# Patient Record
Sex: Female | Born: 1935
Health system: Southern US, Community
[De-identification: ages and names within clinical notes are randomized; demographics above are authoritative.]

## PROBLEM LIST (undated history)

## (undated) DIAGNOSIS — E079 Disorder of thyroid, unspecified: Secondary | ICD-10-CM

## (undated) DIAGNOSIS — K589 Irritable bowel syndrome without diarrhea: Secondary | ICD-10-CM

## (undated) DIAGNOSIS — I10 Essential (primary) hypertension: Secondary | ICD-10-CM

## (undated) DIAGNOSIS — I70209 Unspecified atherosclerosis of native arteries of extremities, unspecified extremity: Secondary | ICD-10-CM

## (undated) DIAGNOSIS — K5790 Diverticulosis of intestine, part unspecified, without perforation or abscess without bleeding: Secondary | ICD-10-CM

## (undated) DIAGNOSIS — M810 Age-related osteoporosis without current pathological fracture: Secondary | ICD-10-CM

## (undated) DIAGNOSIS — M1990 Unspecified osteoarthritis, unspecified site: Secondary | ICD-10-CM

## (undated) DIAGNOSIS — E7800 Pure hypercholesterolemia, unspecified: Secondary | ICD-10-CM

## (undated) HISTORY — PX: CHOLECYSTECTOMY: SHX55

## (undated) HISTORY — DX: Pure hypercholesterolemia, unspecified: E78.00

## (undated) HISTORY — DX: Irritable bowel syndrome, unspecified: K58.9

## (undated) HISTORY — PX: APPENDECTOMY: SHX54

## (undated) HISTORY — DX: Unspecified atherosclerosis of native arteries of extremities, unspecified extremity: I70.209

## (undated) HISTORY — PX: COLON SURGERY: SHX602

## (undated) HISTORY — DX: Unspecified osteoarthritis, unspecified site: M19.90

## (undated) HISTORY — DX: Age-related osteoporosis without current pathological fracture: M81.0

## (undated) HISTORY — DX: Diverticulosis of intestine, part unspecified, without perforation or abscess without bleeding: K57.90

---

## 1998-05-31 ENCOUNTER — Emergency Department (HOSPITAL_COMMUNITY): Admission: EM | Admit: 1998-05-31 | Discharge: 1998-05-31 | Payer: Self-pay | Admitting: Emergency Medicine

## 1998-12-17 ENCOUNTER — Ambulatory Visit (HOSPITAL_COMMUNITY): Admission: RE | Admit: 1998-12-17 | Discharge: 1998-12-17 | Payer: Self-pay | Admitting: *Deleted

## 1999-11-02 ENCOUNTER — Other Ambulatory Visit: Admission: RE | Admit: 1999-11-02 | Discharge: 1999-11-02 | Payer: Self-pay | Admitting: Internal Medicine

## 1999-11-05 ENCOUNTER — Encounter: Admission: RE | Admit: 1999-11-05 | Discharge: 1999-11-05 | Payer: Self-pay | Admitting: Internal Medicine

## 1999-11-05 ENCOUNTER — Encounter: Payer: Self-pay | Admitting: Internal Medicine

## 2000-11-21 ENCOUNTER — Encounter: Payer: Self-pay | Admitting: Internal Medicine

## 2000-11-21 ENCOUNTER — Encounter: Admission: RE | Admit: 2000-11-21 | Discharge: 2000-11-21 | Payer: Self-pay | Admitting: Internal Medicine

## 2001-03-27 ENCOUNTER — Encounter (INDEPENDENT_AMBULATORY_CARE_PROVIDER_SITE_OTHER): Payer: Self-pay | Admitting: Specialist

## 2001-03-27 ENCOUNTER — Ambulatory Visit (HOSPITAL_COMMUNITY): Admission: RE | Admit: 2001-03-27 | Discharge: 2001-03-27 | Payer: Self-pay | Admitting: *Deleted

## 2001-05-24 ENCOUNTER — Encounter: Admission: RE | Admit: 2001-05-24 | Discharge: 2001-05-24 | Payer: Self-pay | Admitting: Internal Medicine

## 2001-05-24 ENCOUNTER — Encounter: Payer: Self-pay | Admitting: Internal Medicine

## 2001-11-22 ENCOUNTER — Encounter: Admission: RE | Admit: 2001-11-22 | Discharge: 2001-11-22 | Payer: Self-pay | Admitting: Internal Medicine

## 2001-11-22 ENCOUNTER — Encounter: Payer: Self-pay | Admitting: Internal Medicine

## 2002-01-29 ENCOUNTER — Other Ambulatory Visit: Admission: RE | Admit: 2002-01-29 | Discharge: 2002-01-29 | Payer: Self-pay | Admitting: Internal Medicine

## 2002-06-13 ENCOUNTER — Encounter (INDEPENDENT_AMBULATORY_CARE_PROVIDER_SITE_OTHER): Payer: Self-pay | Admitting: Specialist

## 2002-06-13 ENCOUNTER — Ambulatory Visit (HOSPITAL_COMMUNITY): Admission: RE | Admit: 2002-06-13 | Discharge: 2002-06-13 | Payer: Self-pay | Admitting: *Deleted

## 2002-12-11 ENCOUNTER — Encounter: Admission: RE | Admit: 2002-12-11 | Discharge: 2002-12-11 | Payer: Self-pay | Admitting: Internal Medicine

## 2002-12-11 ENCOUNTER — Encounter: Payer: Self-pay | Admitting: Internal Medicine

## 2002-12-19 ENCOUNTER — Encounter: Payer: Self-pay | Admitting: Internal Medicine

## 2002-12-19 ENCOUNTER — Encounter: Admission: RE | Admit: 2002-12-19 | Discharge: 2002-12-19 | Payer: Self-pay | Admitting: Internal Medicine

## 2003-12-22 ENCOUNTER — Encounter: Admission: RE | Admit: 2003-12-22 | Discharge: 2003-12-22 | Payer: Self-pay | Admitting: Internal Medicine

## 2004-05-18 ENCOUNTER — Encounter (INDEPENDENT_AMBULATORY_CARE_PROVIDER_SITE_OTHER): Payer: Self-pay | Admitting: Specialist

## 2004-05-18 ENCOUNTER — Ambulatory Visit (HOSPITAL_COMMUNITY): Admission: RE | Admit: 2004-05-18 | Discharge: 2004-05-18 | Payer: Self-pay | Admitting: *Deleted

## 2004-05-21 ENCOUNTER — Emergency Department (HOSPITAL_COMMUNITY): Admission: EM | Admit: 2004-05-21 | Discharge: 2004-05-21 | Payer: Self-pay | Admitting: Emergency Medicine

## 2005-03-16 ENCOUNTER — Encounter: Admission: RE | Admit: 2005-03-16 | Discharge: 2005-03-16 | Payer: Self-pay | Admitting: Internal Medicine

## 2006-04-04 ENCOUNTER — Encounter: Admission: RE | Admit: 2006-04-04 | Discharge: 2006-04-04 | Payer: Self-pay | Admitting: Internal Medicine

## 2007-04-06 ENCOUNTER — Encounter: Admission: RE | Admit: 2007-04-06 | Discharge: 2007-04-06 | Payer: Self-pay | Admitting: Internal Medicine

## 2007-05-14 ENCOUNTER — Other Ambulatory Visit: Admission: RE | Admit: 2007-05-14 | Discharge: 2007-05-14 | Payer: Self-pay | Admitting: Internal Medicine

## 2007-07-03 ENCOUNTER — Ambulatory Visit (HOSPITAL_COMMUNITY): Admission: RE | Admit: 2007-07-03 | Discharge: 2007-07-03 | Payer: Self-pay | Admitting: *Deleted

## 2007-07-03 ENCOUNTER — Encounter (INDEPENDENT_AMBULATORY_CARE_PROVIDER_SITE_OTHER): Payer: Self-pay | Admitting: *Deleted

## 2008-04-07 ENCOUNTER — Encounter: Admission: RE | Admit: 2008-04-07 | Discharge: 2008-04-07 | Payer: Self-pay | Admitting: Internal Medicine

## 2009-04-08 ENCOUNTER — Encounter: Admission: RE | Admit: 2009-04-08 | Discharge: 2009-04-08 | Payer: Self-pay | Admitting: Internal Medicine

## 2009-05-01 ENCOUNTER — Encounter: Admission: RE | Admit: 2009-05-01 | Discharge: 2009-05-01 | Payer: Self-pay | Admitting: *Deleted

## 2009-05-03 ENCOUNTER — Emergency Department (HOSPITAL_COMMUNITY): Admission: EM | Admit: 2009-05-03 | Discharge: 2009-05-03 | Payer: Self-pay | Admitting: Emergency Medicine

## 2010-04-12 ENCOUNTER — Encounter: Admission: RE | Admit: 2010-04-12 | Discharge: 2010-04-12 | Payer: Self-pay | Admitting: Internal Medicine

## 2011-01-23 LAB — BASIC METABOLIC PANEL
GFR calc non Af Amer: 59 mL/min — ABNORMAL LOW (ref >60)
Sodium: 136 mEq/L (ref 135–145)

## 2011-01-23 LAB — URINE CULTURE
Colony Count: NO GROWTH
Culture: NO GROWTH

## 2011-01-23 LAB — CBC
Hemoglobin: 15.8 g/dL — ABNORMAL HIGH (ref 12.0–15.0)
MCHC: 33.8 g/dL (ref 30.0–36.0)
Platelets: 361 10*3/uL (ref 150–400)
RBC: 5.14 MIL/uL — ABNORMAL HIGH (ref 3.87–5.11)
RDW: 13.1 % (ref 11.5–15.5)
WBC: 9.2 10*3/uL (ref 4.0–10.5)

## 2011-01-23 LAB — URINALYSIS, ROUTINE W REFLEX MICROSCOPIC
Ketones, ur: NEGATIVE mg/dL
Nitrite: NEGATIVE
Specific Gravity, Urine: 1.01 (ref 1.005–1.030)
Urobilinogen, UA: 0.2 mg/dL (ref 0.0–1.0)
pH: 7.5 (ref 5.0–8.0)

## 2011-01-23 LAB — GLUCOSE, CAPILLARY

## 2011-01-23 LAB — DIFFERENTIAL
Basophils Relative: 0 % (ref 0–1)
Eosinophils Absolute: 0 10*3/uL (ref 0.0–0.7)
Eosinophils Relative: 0 % (ref 0–5)
Lymphocytes Relative: 16 % (ref 12–46)

## 2011-03-01 NOTE — Op Note (Signed)
NAME:  Danielle Proctor, Danielle Proctor NO.:  [id number]   MEDICAL RECORD NO.:  [id number]          PATIENT TYPE:  AMB   LOCATION:  ENDO                         FACILITY:  [facility]   PHYSICIAN:  Georgiana Spinner, M.D.    DATE OF BIRTH:  1929-06-26   DATE OF PROCEDURE:  07/02/2007  DATE OF DISCHARGE:                               OPERATIVE REPORT   PROCEDURE:  Upper endoscopy.   INDICATIONS:  GERD.   ANESTHESIA:  Fentanyl 50 mcg, Versed 5 mg.   PROCEDURE:  With the patient mildly sedated in the left lateral  decubitus position, the Pentax videoscopic endoscope was inserted in the  mouth, passed under direct vision through the esophagus, which appeared  normal.  We could never get a clear view of the squamocolumnar junction  but on one view with the esophagus contracted, we did see areas that  could be conceivably Barrett's esophagus, biopsied.  Then entered into  the stomach.  Fundus, body appeared erythematous and there was a clear  demarcation from the antrum, which appeared normal in coloration.  This  was photographed and the body was biopsied to rule out H. pylori.  The  endoscope was advanced through the pylorus and duodenal bulb, second  portion duodenum were visualized.  From this point the endoscope was  slowly withdrawn taking circumferential views of the duodenal mucosa  until the endoscope had been pulled back into the stomach and placed in  retroflexion to view the stomach from below.  The endoscope was  straightened and withdrawn taking circumferential views of the remaining  gastric and esophageal mucosa.  The patient's vital signs, pulse  oximeter remained stable.  The patient tolerated the procedure well  without apparent complications.   FINDINGS:  1. Question of gastritis of body and fundus, biopsied.  2. Question of Barrett's esophagus, biopsied.   Await biopsy reports.  The patient will call me for results and follow  up with me as needed as an  outpatient.           ______________________________  Georgiana Spinner, M.D.     GMO/MEDQ  D:  07/03/2007  T:  07/03/2007  Job:  [id number]

## 2011-03-04 NOTE — Procedures (Signed)
[place]  Patient:    Danielle Proctor, Danielle Proctor                   MRN: [id number] Adm. Date:  [id number] Attending:  Sabino Gasser                           Procedure Report  PROCEDURE:  Upper endoscopy with biopsy.  INDICATIONS:  GERD, Barretts esophagus.  ANESTHESIA:  Demerol 50 mg, Versed 5 mg.  DESCRIPTION OF PROCEDURE:  With patient mildly sedated in the left lateral decubitus position, the Olympus videoscopic endoscope inserted in the mouth, passed under direct vision through the esophagus into the stomach.  Fundus, body, antrum, duodenal bulb, second portion of duodenum were all well-visualized and photographs taken.  From this point, the endoscope was slowly withdrawn, taking circumferential views of the entire duodenal mucosa until the endoscope pulled back in the stomach, placed in retroflexion to view the stomach from below, and this was photographed.  The endoscope was straightened and withdrawn, taking circumferential views of the entire gastric mucosa, stopping then in the fundus, where a snake-skinning appearance to the mucosa was seen, photographed, and biopsied.  The endoscope was then pulled back into the distal esophagus, which showed changes of Barretts esophagus, photographed and biopsies taken.  The endoscope was withdrawn.  The patient stable vital signs and pulse oximetry remained stable.  The patient tolerated the procedure well without apparent complications.  FINDINGS:  Changes of Barretts esophagus, distal esophagus.  Changes of snake-skinning, rule out gastritis, of the fundus of the stomach.  Otherwise this as an unremarkable endoscopic examination.  PLAN:  The patient will call me for results of biopsy and follow up with me as an outpatient. DD:  03/27/01 TD:  03/27/01 Job: [id number]

## 2011-03-04 NOTE — Op Note (Signed)
NAME:  DION, PARROW NO.:  [id number]   MEDICAL RECORD NO.:  [id number]                   PATIENT TYPE:  AMB   LOCATION:  ENDO                                 FACILITY:  [facility]   PHYSICIAN:  Georgiana Spinner, M.D.                 DATE OF BIRTH:  [birth year]   DATE OF PROCEDURE:  DATE OF DISCHARGE:                                 OPERATIVE REPORT   PROCEDURE:  Colonoscopy.   INDICATIONS FOR PROCEDURE:  Colon cancer screening.   ANESTHESIA:  Demerol 25, Versed 2 mg.   DESCRIPTION OF PROCEDURE:  With the patient mildly sedated in the left  lateral decubitus position, the Olympus videoscopic colonoscope was inserted  in the rectum and passed under direct vision to the cecum, identified by  ileocecal valve and appendiceal orifice and base of the cecum.  The latter  of which were photographed.  From this point, the colonoscope was slowly  withdrawn, taking circumferential views of the colonic mucosa, stopping only  in the rectum which appeared normal.  The rectum showed hemorrhoids on  retroflexed view.  The endoscope was straightened and withdrawn.  The  patient's vital signs and pulse oximeter remained stable.  The patient  tolerated the procedure well without apparent complications.   FINDINGS:  Internal hemorrhoids, otherwise unremarkable colonoscopic  examination to the cecum.   PLAN:  See endoscopy note for further details.                                               Georgiana Spinner, M.D.    GMO/MEDQ  D:  05/18/2004  T:  05/18/2004  Job:  [id number]

## 2011-03-04 NOTE — Op Note (Signed)
   TNAMESMANTHA, BOAKYE                     ACCOUNT NO.:  [id number]   MEDICAL RECORD NO.:  [id number]                   PATIENT TYPE:  AMB   LOCATION:  ENDO                                 FACILITY:  [facility]   PHYSICIAN:  Georgiana Spinner, M.D.                 DATE OF BIRTH:  [birth year]   DATE OF PROCEDURE:  DATE OF DISCHARGE:                                 OPERATIVE REPORT   PROCEDURE:  Endoscopy with biopsy.   INDICATIONS FOR PROCEDURE:  Barrett's esophagus by biopsy in the past,  gastroesophageal reflux disease.   ANESTHESIA:  Demerol 50, Versed 7 mg.   DESCRIPTION OF PROCEDURE:  With the patient mildly sedated in the left  lateral decubitus position, the Olympus videoscopic endoscope was inserted  in the mouth and passed under direct vision through the esophagus which  appeared normal until we reached the distal esophagus and there was a  question of short segment Barrett's seen, photographed and biopsied. We  entered into the stomach, the fundus, body, antrum, duodenal bulb and second  portion of the duodenum all appeared normal. From this point, the endoscope  was slowly withdrawn taking circumferential views of the entire duodenal  mucosa until the endoscope was then pulled back in the stomach, placed in  retroflexion to view the stomach from below. The endoscope was then  straightened and withdrawn taking circumferential views of the remaining  gastric and esophageal mucosa. The patient's vital signs and pulse oximeter  remained stable. The patient tolerated the procedure well without apparent  complications.   FINDINGS:  Question of short segment Barrett's esophagus, otherwise,  unremarkable exam.   PLAN:  Await biopsy report. The patient will call me for results and  followup with me as an outpatient.                                                 Georgiana Spinner, M.D.    GMO/MEDQ  D:  06/13/2002  T:  06/14/2002  Job:  [id number]

## 2011-03-04 NOTE — Op Note (Signed)
NAME:  Danielle, Proctor NO.:  [id number]   MEDICAL RECORD NO.:  [id number]                   PATIENT TYPE:  AMB   LOCATION:  ENDO                                 FACILITY:  [facility]   PHYSICIAN:  Georgiana Spinner, M.D.                 DATE OF BIRTH:  1928-11-16   DATE OF PROCEDURE:  05/18/2004  DATE OF DISCHARGE:                                 OPERATIVE REPORT   PROCEDURE PERFORMED:  Upper endoscopy.   ENDOSCOPIST:  Georgiana Spinner, M.D.   INDICATIONS FOR PROCEDURE:  Barrett's esophagus, gastroesophageal reflux  disease.   ANESTHESIA:  Demerol 50 mg, Versed 6 mg.   DESCRIPTION OF PROCEDURE:  With the patient mildly sedated in the left  lateral decubitus position, the Olympus video endoscope was inserted in the  mouth and passed under direct vision through the esophagus which appeared  normal until we reached the distal esophagus and there were changes of  Barrett's photographed and biopsied.  We entered into the stomach.  The  fundus, body, antrum, duodenal bulb and second portion of the duodenum were  visualized.  From this point, the endoscope was slowly withdrawn taking  circumferential views of the entire duodenal mucosa until the endoscope was  pulled back into the stomach and placed on retroflexion to view the stomach  from below.  The endoscope was then straightened and withdrawn taking  circumferential views of the remaining gastric and esophageal mucosa.  The  patient's vital signs and pulse oximeter remained stable.  The patient  tolerated the procedure well without apparent complications.   FINDINGS:  Barrett's esophagus biopsied.  Await biopsy report.  The patient  will call me for results and follow up with me as an outpatient.  Proceed to  colonoscopy as planned.                                               Georgiana Spinner, M.D.    GMO/MEDQ  D:  05/18/2004  T:  05/18/2004  Job:  [id number]

## 2011-03-24 ENCOUNTER — Other Ambulatory Visit: Payer: Self-pay | Admitting: Internal Medicine

## 2011-03-24 DIAGNOSIS — Z1231 Encounter for screening mammogram for malignant neoplasm of breast: Secondary | ICD-10-CM

## 2011-03-30 ENCOUNTER — Ambulatory Visit: Payer: Self-pay

## 2011-04-07 ENCOUNTER — Ambulatory Visit
Admission: RE | Admit: 2011-04-07 | Discharge: 2011-04-07 | Disposition: A | Discharge status: Home / Self Care | Payer: Medicare Other | Source: Ambulatory Visit | Attending: Internal Medicine | Admitting: Internal Medicine

## 2011-04-07 DIAGNOSIS — Z1231 Encounter for screening mammogram for malignant neoplasm of breast: Secondary | ICD-10-CM

## 2012-03-27 ENCOUNTER — Other Ambulatory Visit: Payer: Self-pay | Admitting: Internal Medicine

## 2012-03-27 DIAGNOSIS — Z1231 Encounter for screening mammogram for malignant neoplasm of breast: Secondary | ICD-10-CM

## 2012-04-09 ENCOUNTER — Ambulatory Visit
Admission: RE | Admit: 2012-04-09 | Discharge: 2012-04-09 | Disposition: A | Discharge status: Home / Self Care | Payer: Medicare Other | Source: Ambulatory Visit | Attending: Internal Medicine | Admitting: Internal Medicine

## 2012-04-09 DIAGNOSIS — Z1231 Encounter for screening mammogram for malignant neoplasm of breast: Secondary | ICD-10-CM

## 2012-07-13 ENCOUNTER — Other Ambulatory Visit: Payer: Self-pay | Admitting: Internal Medicine

## 2012-07-13 DIAGNOSIS — I70209 Unspecified atherosclerosis of native arteries of extremities, unspecified extremity: Secondary | ICD-10-CM

## 2012-07-20 ENCOUNTER — Ambulatory Visit
Admission: RE | Admit: 2012-07-20 | Discharge: 2012-07-20 | Disposition: A | Discharge status: Home / Self Care | Payer: Medicare Other | Source: Ambulatory Visit | Attending: Internal Medicine | Admitting: Internal Medicine

## 2012-07-20 DIAGNOSIS — I70209 Unspecified atherosclerosis of native arteries of extremities, unspecified extremity: Secondary | ICD-10-CM

## 2013-04-02 ENCOUNTER — Other Ambulatory Visit: Payer: Self-pay

## 2013-04-02 DIAGNOSIS — Z1231 Encounter for screening mammogram for malignant neoplasm of breast: Secondary | ICD-10-CM

## 2013-05-07 ENCOUNTER — Ambulatory Visit
Admission: RE | Admit: 2013-05-07 | Discharge: 2013-05-07 | Disposition: A | Discharge status: Home / Self Care | Payer: Medicare Other | Source: Ambulatory Visit

## 2013-05-07 DIAGNOSIS — Z1231 Encounter for screening mammogram for malignant neoplasm of breast: Secondary | ICD-10-CM

## 2014-04-07 ENCOUNTER — Other Ambulatory Visit: Payer: Self-pay

## 2014-04-07 DIAGNOSIS — Z1231 Encounter for screening mammogram for malignant neoplasm of breast: Secondary | ICD-10-CM

## 2014-05-08 ENCOUNTER — Ambulatory Visit
Admission: RE | Admit: 2014-05-08 | Discharge: 2014-05-08 | Disposition: A | Discharge status: Home / Self Care | Payer: Medicare Other | Source: Ambulatory Visit

## 2014-05-08 ENCOUNTER — Encounter (INDEPENDENT_AMBULATORY_CARE_PROVIDER_SITE_OTHER): Payer: Self-pay

## 2014-05-08 DIAGNOSIS — Z1231 Encounter for screening mammogram for malignant neoplasm of breast: Secondary | ICD-10-CM

## 2014-06-22 ENCOUNTER — Emergency Department (HOSPITAL_COMMUNITY)
Admission: EM | Admit: 2014-06-22 | Discharge: 2014-06-22 | Disposition: A | Discharge status: Home / Self Care | Payer: Medicare Other | Attending: Emergency Medicine | Admitting: Emergency Medicine

## 2014-06-22 ENCOUNTER — Encounter (HOSPITAL_COMMUNITY): Payer: Self-pay | Admitting: Emergency Medicine

## 2014-06-22 ENCOUNTER — Emergency Department (HOSPITAL_COMMUNITY): Payer: Medicare Other

## 2014-06-22 DIAGNOSIS — E079 Disorder of thyroid, unspecified: Secondary | ICD-10-CM | POA: Diagnosis not present

## 2014-06-22 DIAGNOSIS — I10 Essential (primary) hypertension: Secondary | ICD-10-CM | POA: Insufficient documentation

## 2014-06-22 DIAGNOSIS — R109 Unspecified abdominal pain: Secondary | ICD-10-CM | POA: Diagnosis present

## 2014-06-22 DIAGNOSIS — R231 Pallor: Secondary | ICD-10-CM | POA: Insufficient documentation

## 2014-06-22 DIAGNOSIS — K589 Irritable bowel syndrome without diarrhea: Secondary | ICD-10-CM | POA: Diagnosis not present

## 2014-06-22 DIAGNOSIS — Z79899 Other long term (current) drug therapy: Secondary | ICD-10-CM | POA: Insufficient documentation

## 2014-06-22 DIAGNOSIS — R1030 Lower abdominal pain, unspecified: Secondary | ICD-10-CM

## 2014-06-22 DIAGNOSIS — Z7982 Long term (current) use of aspirin: Secondary | ICD-10-CM | POA: Diagnosis not present

## 2014-06-22 HISTORY — DX: Essential (primary) hypertension: I10

## 2014-06-22 HISTORY — DX: Disorder of thyroid, unspecified: E07.9

## 2014-06-22 LAB — COMPREHENSIVE METABOLIC PANEL
ALK PHOS: 19 U/L — AB (ref 39–117)
ALT: 24 U/L (ref 0–35)
AST: 30 U/L (ref 0–37)
Albumin: 4.1 g/dL (ref 3.5–5.2)
Anion gap: 17 — ABNORMAL HIGH (ref 5–15)
BILIRUBIN TOTAL: 0.5 mg/dL (ref 0.3–1.2)
BUN: 16 mg/dL (ref 6–23)
CHLORIDE: 99 meq/L (ref 96–112)
CO2: 23 mEq/L (ref 19–32)
Calcium: 9.8 mg/dL (ref 8.4–10.5)
Creatinine, Ser: 1.07 mg/dL (ref 0.50–1.10)
GFR calc Af Amer: 54 mL/min — ABNORMAL LOW (ref >90)
GFR calc non Af Amer: 47 mL/min — ABNORMAL LOW (ref >90)
Glucose, Bld: 128 mg/dL — ABNORMAL HIGH (ref 70–99)
POTASSIUM: 4.3 meq/L (ref 3.7–5.3)
Sodium: 139 mEq/L (ref 137–147)
Total Protein: 7.3 g/dL (ref 6.0–8.3)

## 2014-06-22 LAB — CBC WITH DIFFERENTIAL/PLATELET
BASOS ABS: 0 10*3/uL (ref 0.0–0.1)
BASOS PCT: 0 % (ref 0–1)
Eosinophils Absolute: 0 10*3/uL (ref 0.0–0.7)
Eosinophils Relative: 0 % (ref 0–5)
HCT: 42.1 % (ref 36.0–46.0)
Hemoglobin: 14.6 g/dL (ref 12.0–15.0)
LYMPHS PCT: 25 % (ref 12–46)
Lymphs Abs: 1.5 10*3/uL (ref 0.7–4.0)
MCH: 30.5 pg (ref 26.0–34.0)
MCHC: 34.7 g/dL (ref 30.0–36.0)
MCV: 88.1 fL (ref 78.0–100.0)
Monocytes Absolute: 0.3 10*3/uL (ref 0.1–1.0)
Monocytes Relative: 6 % (ref 3–12)
NEUTROS ABS: 4.2 10*3/uL (ref 1.7–7.7)
Neutrophils Relative %: 69 % (ref 43–77)
Platelets: 276 10*3/uL (ref 150–400)
RBC: 4.78 MIL/uL (ref 3.87–5.11)
RDW: 12.1 % (ref 11.5–15.5)
WBC: 6.1 10*3/uL (ref 4.0–10.5)

## 2014-06-22 LAB — URINALYSIS, ROUTINE W REFLEX MICROSCOPIC
BILIRUBIN URINE: NEGATIVE
GLUCOSE, UA: NEGATIVE mg/dL
HGB URINE DIPSTICK: NEGATIVE
KETONES UR: NEGATIVE mg/dL
Leukocytes, UA: NEGATIVE
Nitrite: NEGATIVE
PH: 7 (ref 5.0–8.0)
Protein, ur: NEGATIVE mg/dL
Specific Gravity, Urine: 1.007 (ref 1.005–1.030)
Urobilinogen, UA: 0.2 mg/dL (ref 0.0–1.0)

## 2014-06-22 MED ORDER — DICYCLOMINE HCL 20 MG PO TABS: 20.0000 mg | ORAL_TABLET | Freq: Three times a day (TID) | ORAL | Status: DC | PRN

## 2014-06-22 MED ORDER — IOHEXOL 300 MG/ML  SOLN
50.0000 mL | Freq: Once | INTRAMUSCULAR | Status: AC | PRN
  Administered 2014-06-22: 50 mL via ORAL

## 2014-06-22 MED ORDER — DICYCLOMINE HCL 10 MG/ML IM SOLN
10.0000 mg | Freq: Once | INTRAMUSCULAR | Status: AC
  Administered 2014-06-22: 10 mg via INTRAMUSCULAR
  Filled 2014-06-22: qty 2

## 2014-06-22 MED ORDER — SODIUM CHLORIDE 0.9 % IV BOLUS (SEPSIS)
700.0000 mL | Freq: Once | INTRAVENOUS | Status: AC
  Administered 2014-06-22: 700 mL via INTRAVENOUS

## 2014-06-22 MED ORDER — ONDANSETRON HCL 4 MG/2ML IJ SOLN
4.0000 mg | Freq: Once | INTRAMUSCULAR | Status: AC
  Administered 2014-06-22: 4 mg via INTRAVENOUS
  Filled 2014-06-22: qty 2

## 2014-06-22 MED ORDER — SODIUM CHLORIDE 0.9 % IV SOLN
INTRAVENOUS | Status: DC
  Administered 2014-06-22: 17:00:00 via INTRAVENOUS

## 2014-06-22 MED ORDER — HYOSCYAMINE SULFATE 0.125 MG SL SUBL
0.1250 mg | SUBLINGUAL_TABLET | Freq: Once | SUBLINGUAL | Status: AC
  Administered 2014-06-22: 0.125 mg via SUBLINGUAL
  Filled 2014-06-22: qty 1

## 2014-06-22 MED ORDER — FENTANYL CITRATE 0.05 MG/ML IJ SOLN
25.0000 ug | Freq: Once | INTRAMUSCULAR | Status: AC
  Administered 2014-06-22: 25 ug via INTRAVENOUS
  Filled 2014-06-22: qty 2

## 2014-06-22 MED ORDER — IOHEXOL 300 MG/ML  SOLN
100.0000 mL | Freq: Once | INTRAMUSCULAR | Status: AC | PRN
  Administered 2014-06-22: 100 mL via INTRAVENOUS

## 2014-06-22 NOTE — ED Notes (Signed)
Patient transported to CT 

## 2014-06-22 NOTE — ED Notes (Signed)
Pt reports intermittent pain in LLQ , and bloating , intermittent nausea x 2 weeks. Assessed by GI , Dr Randa Evens on Tuesday. Pt has been very active and c/o fatigue. Denies dizziness. AO x$. Pt ambulatory, drove self

## 2014-06-22 NOTE — Discharge Instructions (Signed)
Go back to the bland diet for a couple of days. Use the bentyl for abdominal pain. Recheck if you get fever, vomiting, worsening pain.  Irritable Bowel Syndrome Irritable bowel syndrome (IBS) is caused by a disturbance of normal bowel function and is a common digestive disorder. You may also hear this condition called spastic colon, mucous colitis, and irritable colon. There is no cure for IBS. However, symptoms often gradually improve or disappear with a good diet, stress management, and medicine. This condition usually appears in late adolescence or early adulthood. Women develop it twice as often as men. CAUSES  After food has been digested and absorbed in the small intestine, waste material is moved into the large intestine, or colon. In the colon, water and salts are absorbed from the undigested products coming from the small intestine. The remaining residue, or fecal material, is held for elimination. Under normal circumstances, gentle, rhythmic contractions of the bowel walls push the fecal material along the colon toward the rectum. In IBS, however, these contractions are irregular and poorly coordinated. The fecal material is either retained too long, resulting in constipation, or expelled too soon, producing diarrhea. SIGNS AND SYMPTOMS  The most common symptom of IBS is abdominal pain. It is often in the lower left side of the abdomen, but it may occur anywhere in the abdomen. The pain comes from spasms of the bowel muscles happening too much and from the buildup of gas and fecal material in the colon. This pain:  Can range from sharp abdominal cramps to a dull, continuous ache.  Often worsens soon after eating.  Is often relieved by having a bowel movement or passing gas. Abdominal pain is usually accompanied by constipation, but it may also produce diarrhea. The diarrhea often occurs right after a meal or upon waking up in the morning. The stools are often soft, watery, and flecked with  mucus. Other symptoms of IBS include:  Bloating.  Loss of appetite.  Heartburn.  Backache.  Dull pain in the arms or shoulders.  Nausea.  Burping.  Vomiting.  Gas. IBS may also cause symptoms that are unrelated to the digestive system, such as:  Fatigue.  Headaches.  Anxiety.  Shortness of breath.  Trouble concentrating.  Dizziness. These symptoms tend to come and go. DIAGNOSIS  The symptoms of IBS may seem like symptoms of other, more serious digestive disorders. Your health care provider may want to perform tests to exclude these disorders.  TREATMENT Many medicines are available to help correct bowel function or relieve bowel spasms and abdominal pain. Among the medicines available are:  Laxatives for severe constipation and to help restore normal bowel habits.  Specific antidiarrheal medicines to treat severe or lasting diarrhea.  Antispasmodic agents to relieve intestinal cramps. Your health care provider may also decide to treat you with a mild tranquilizer or sedative during unusually stressful periods in your life. Your health care provider may also prescribe antidepressant medicine. The use of this medicine has been shown to reduce pain and other symptoms of IBS. Remember that if any medicine is prescribed for you, you should take it exactly as directed. Make sure your health care provider knows how well it worked for you. HOME CARE INSTRUCTIONS   Take all medicines as directed by your health care provider.  Avoid foods that are high in fat or oils, such as heavy cream, butter, frankfurters, sausage, and other fatty meats.  Avoid foods that make you go to the bathroom, such as fruit, fruit  juice, and dairy products.  Cut out carbonated drinks, chewing gum, and "gassy" foods such as beans and cabbage. This may help relieve bloating and burping.  Eat foods with bran, and drink plenty of liquids with the bran foods. This helps relieve constipation.  Keep  track of what foods seem to bring on your symptoms.  Avoid emotionally charged situations or circumstances that produce anxiety.  Start or continue exercising.  Get plenty of rest and sleep. Document Released: 10/03/2005 Document Revised: 10/08/2013 Document Reviewed: 05/23/2008 [facility] Patient Information 2015 [place], [place]. This information is not intended to replace advice given to you by your health care provider. Make sure you discuss any questions you have with your health care provider.

## 2014-06-22 NOTE — ED Provider Notes (Signed)
CSN: [id number]     Arrival date & time 06/22/14  1316 History   First MD Initiated Contact with Patient 06/22/14 1342     Chief Complaint  Patient presents with  . Abdominal Pain    x 2 weeks. Pt AO x4. Pain intermittent  . Nausea    intermittent nausea, denies vomiting     (Consider location/radiation/quality/duration/timing/severity/associated sxs/prior Treatment) HPI Patient reports she was having diarrhea alternating with constipation the first part of August. She was seen by her gastroenterologist Dr. Randa Evens around August 10. He diagnosed her with diverticulitis. She has a history of diverticulitis however she has not had it in years. She states she was placed on medication ? metronidazole which initially made the diarrhea worse. She relates however the diarrhea did go away. She was seen 5 days ago for her final check. He was not sure if she had had diverticulitis but told her she could go back to a more regular diet. She reports she ate pork loin with potatoes however couple days later she started having worsening pain in her lower limb. She states she went back on a liquid diet however the pain has persisted. She states she took Pepto-Bismol 2 days ago and "it cleaned me out". She states she had a lot of soft stools that were black. She has had mild nausea however she has had a loss of appetite. She states she couldn't sleep although she was not having pain at night. She states her pain is mainly in the daytime and worse in the afternoon. It's in her lower abdomen all the way across and she describes as dull. It comes and goes and lasts hours. She denies any dysuria, frequency, documented fever. She states she feels weak and sometimes she is dizzy or lightheaded.   PCP Dr Renne Crigler GI Dr Randa Evens  Past Medical History  Diagnosis Date  . Thyroid disease   . Hypertension    Past Surgical History  Procedure Laterality Date  . Appendectomy    . Colon surgery    . Cholecystectomy     No  family history on file. History  Substance Use Topics  . Smoking status: Never Smoker   . Smokeless tobacco: Not on file  . Alcohol Use: Yes     Comment: Socially    Lives at home Lives alone  OB History   Grav Para Term Preterm Abortions TAB SAB Ect Mult Living                 Review of Systems  All other systems reviewed and are negative.     Allergies  Sulfa antibiotics  Home Medications   Prior to Admission medications   Medication Sig Start Date End Date Taking? Authorizing Provider  aspirin EC 81 MG tablet Take 81 mg by mouth daily.   Yes Historical Provider, MD  bismuth subsalicylate (PEPTO BISMOL) 262 MG chewable tablet Chew 524 mg by mouth as needed for indigestion or diarrhea or loose stools.   Yes Historical Provider, MD  cholecalciferol (VITAMIN D) 1000 UNITS tablet Take 2,000 Units by mouth daily.   Yes Historical Provider, MD  DIAZEPAM PO Take 1 tablet by mouth every 6 (six) hours as needed (anxiety).   Yes Historical Provider, MD  levothyroxine (SYNTHROID, LEVOTHROID) 100 MCG tablet Take 100 mcg by mouth daily before breakfast.  03/28/14  Yes Historical Provider, MD  lisinopril (PRINIVIL,ZESTRIL) 20 MG tablet Take 20 mg by mouth every evening.  04/28/14  Yes Historical Provider, MD  raloxifene (EVISTA) 60 MG tablet Take 60 mg by mouth daily.  04/07/14  Yes Historical Provider, MD  simethicone (MYLICON) 80 MG chewable tablet Chew 80 mg by mouth every 6 (six) hours as needed for flatulence.   Yes Historical Provider, MD   BP 159/80  Pulse 115  Temp(Src) 98.3 F (36.8 C) (Oral)  Resp 20  SpO2 100%  Vital signs normal except for tachycardia  Physical Exam  Nursing note and vitals reviewed. Constitutional: She is oriented to person, place, and time. She appears well-developed and well-nourished.  Non-toxic appearance. She does not appear ill. No distress.  HENT:  Head: Normocephalic and atraumatic.  Right Ear: External ear normal.  Left Ear: External ear  normal.  Nose: Nose normal. No mucosal edema or rhinorrhea.  Mouth/Throat: Mucous membranes are normal. No dental abscesses or uvula swelling.  Tongue dry  Eyes: Conjunctivae and EOM are normal. Pupils are equal, round, and reactive to light.  Neck: Normal range of motion and full passive range of motion without pain. Neck supple.  Cardiovascular: Normal rate, regular rhythm and normal heart sounds.  Exam reveals no gallop and no friction rub.   No murmur heard. Pulmonary/Chest: Effort normal and breath sounds normal. No respiratory distress. She has no wheezes. She has no rhonchi. She has no rales. She exhibits no tenderness and no crepitus.  Abdominal: Soft. Normal appearance and bowel sounds are normal. She exhibits distension. There is no tenderness. There is no rebound and no guarding.    Hyperactive bowel sounds, mild diffuse lower abdominal pain, mild distention  Musculoskeletal: Normal range of motion. She exhibits no edema and no tenderness.  Moves all extremities well.   Neurological: She is alert and oriented to person, place, and time. She has normal strength. No cranial nerve deficit.  Skin: Skin is warm, dry and intact. No rash noted. No erythema. There is pallor.  Psychiatric: She has a normal mood and affect. Her speech is normal and behavior is normal. Her mood appears not anxious.    ED Course  Procedures (including critical care time)  Medications  0.9 %  sodium chloride infusion ( Intravenous Stopped 06/22/14 1948)  sodium chloride 0.9 % bolus 700 mL (0 mLs Intravenous Stopped 06/22/14 1651)  fentaNYL (SUBLIMAZE) injection 25 mcg (25 mcg Intravenous Given 06/22/14 1557)  ondansetron (ZOFRAN) injection 4 mg (4 mg Intravenous Given 06/22/14 1557)  iohexol (OMNIPAQUE) 300 MG/ML solution 50 mL (50 mLs Oral Contrast Given 06/22/14 1610)  iohexol (OMNIPAQUE) 300 MG/ML solution 100 mL (100 mLs Intravenous Contrast Given 06/22/14 1649)  hyoscyamine (LEVSIN SL) SL tablet 0.125 mg (0.125  mg Sublingual Given 06/22/14 1833)  dicyclomine (BENTYL) injection 10 mg (10 mg Intramuscular Given 06/22/14 1913)    Patient had been given fentanyl for pain. She still had some lower abdominal discomfort. She did mention that she had IBS in the past and she was aware of the hyperactive bowel sounds. We discussed her CT scan which did not show a reason for her pain. She was given hyoscyamine sublingual for possible IBS with no change of her pain. She was given Bentyl IM and her pain resolved. We discussed watching her diet closely the next couple of days.   Labs Review Results for orders placed during the hospital encounter of 06/22/14  CBC WITH DIFFERENTIAL      Result Value Ref Range   WBC 6.1  4.0 - 10.5 K/uL   RBC 4.78  3.87 - 5.11 MIL/uL   Hemoglobin 14.6  12.0 - 15.0 g/dL   HCT 96.0  45.4 - 09.8 %   MCV 88.1  78.0 - 100.0 fL   MCH 30.5  26.0 - 34.0 pg   MCHC 34.7  30.0 - 36.0 g/dL   RDW 11.9  14.7 - 82.9 %   Platelets 276  150 - 400 K/uL   Neutrophils Relative % 69  43 - 77 %   Neutro Abs 4.2  1.7 - 7.7 K/uL   Lymphocytes Relative 25  12 - 46 %   Lymphs Abs 1.5  0.7 - 4.0 K/uL   Monocytes Relative 6  3 - 12 %   Monocytes Absolute 0.3  0.1 - 1.0 K/uL   Eosinophils Relative 0  0 - 5 %   Eosinophils Absolute 0.0  0.0 - 0.7 K/uL   Basophils Relative 0  0 - 1 %   Basophils Absolute 0.0  0.0 - 0.1 K/uL  COMPREHENSIVE METABOLIC PANEL      Result Value Ref Range   Sodium 139  137 - 147 mEq/L   Potassium 4.3  3.7 - 5.3 mEq/L   Chloride 99  96 - 112 mEq/L   CO2 23  19 - 32 mEq/L   Glucose, Bld 128 (*) 70 - 99 mg/dL   BUN 16  6 - 23 mg/dL   Creatinine, Ser 5.62  0.50 - 1.10 mg/dL   Calcium 9.8  8.4 - 13.0 mg/dL   Total Protein 7.3  6.0 - 8.3 g/dL   Albumin 4.1  3.5 - 5.2 g/dL   AST 30  0 - 37 U/L   ALT 24  0 - 35 U/L   Alkaline Phosphatase 19 (*) 39 - 117 U/L   Total Bilirubin 0.5  0.3 - 1.2 mg/dL   GFR calc non Af Amer 47 (*) >90 mL/min   GFR calc Af Amer 54 (*) >90 mL/min    Anion gap 17 (*) 5 - 15  URINALYSIS, ROUTINE W REFLEX MICROSCOPIC      Result Value Ref Range   Color, Urine YELLOW  YELLOW   APPearance CLOUDY (*) CLEAR   Specific Gravity, Urine 1.007  1.005 - 1.030   pH 7.0  5.0 - 8.0   Glucose, UA NEGATIVE  NEGATIVE mg/dL   Hgb urine dipstick NEGATIVE  NEGATIVE   Bilirubin Urine NEGATIVE  NEGATIVE   Ketones, ur NEGATIVE  NEGATIVE mg/dL   Protein, ur NEGATIVE  NEGATIVE mg/dL   Urobilinogen, UA 0.2  0.0 - 1.0 mg/dL   Nitrite NEGATIVE  NEGATIVE   Leukocytes, UA NEGATIVE  NEGATIVE   Laboratory interpretation all normal     Imaging Review Ct Abdomen Pelvis W Contrast  06/22/2014   CLINICAL DATA:  Intermittent left lower quadrant abdominal pain. Bloating. Intermittent nausea.  EXAM: CT ABDOMEN AND PELVIS WITH CONTRAST  TECHNIQUE: Multidetector CT imaging of the abdomen and pelvis was performed using the standard protocol following bolus administration of intravenous contrast.  CONTRAST:  50mL OMNIPAQUE IOHEXOL 300 MG/ML SOLN, OMNIPAQUE IOHEXOL 300 MG/ML SOLN  COMPARISON:  Multiple exams, including 07/20/2012 and 05/01/2009  FINDINGS: Dependent subsegmental atelectasis in the posterior basal segments both lower lobes.  Diffuse hepatic steatosis. Spleen, pancreas, and adrenal glands unremarkable. Gallbladder surgically absent. The celiac trunk, SMA, and IMA opacified with contrast compatible with patency.  Aortoiliac atherosclerotic vascular disease. No significant renal, ureteral, or bladder abnormality identified.  Upper normal sized gastrohepatic ligament lymph nodes noted.  No significant diverticulosis or diverticulitis. Orally administered contrast extends through  to the rectum. Uterine and adnexal contours unremarkable. No pathologic pelvic adenopathy is observed.  Lower lumbar facet arthropathy and degenerative disc disease.  IMPRESSION: 1. A cause for the patient's abdominal pain, bloating, and nausea is not identified. 2. Diffuse hepatic steatosis.  3.  Aortoiliac atherosclerotic vascular disease.   Electronically Signed   By: Herbie Baltimore M.D.   On: 06/22/2014 17:09     EKG Interpretation None      MDM   Final diagnoses:  Lower abdominal pain  IBS (irritable bowel syndrome)    New Prescriptions   DICYCLOMINE (BENTYL) 20 MG TABLET    Take 1 tablet (20 mg total) by mouth 3 (three) times daily as needed for spasms (abdominal pain).    Plan discharge  Devoria Albe, MD, Franz Dell, MD 06/22/14 2011

## 2015-05-01 ENCOUNTER — Other Ambulatory Visit: Payer: Self-pay | Admitting: Internal Medicine

## 2015-05-01 DIAGNOSIS — Z1231 Encounter for screening mammogram for malignant neoplasm of breast: Secondary | ICD-10-CM

## 2015-06-04 ENCOUNTER — Ambulatory Visit: Payer: Self-pay

## 2015-06-29 ENCOUNTER — Ambulatory Visit: Payer: Self-pay

## 2016-03-02 DIAGNOSIS — Z13220 Encounter for screening for lipoid disorders: Secondary | ICD-10-CM | POA: Diagnosis not present

## 2016-03-02 DIAGNOSIS — Z0001 Encounter for general adult medical examination with abnormal findings: Secondary | ICD-10-CM | POA: Diagnosis not present

## 2016-03-02 DIAGNOSIS — Z79899 Other long term (current) drug therapy: Secondary | ICD-10-CM | POA: Diagnosis not present

## 2016-03-02 DIAGNOSIS — R7309 Other abnormal glucose: Secondary | ICD-10-CM | POA: Diagnosis not present

## 2016-03-02 DIAGNOSIS — E559 Vitamin D deficiency, unspecified: Secondary | ICD-10-CM | POA: Diagnosis not present

## 2016-03-02 DIAGNOSIS — N390 Urinary tract infection, site not specified: Secondary | ICD-10-CM | POA: Diagnosis not present

## 2016-03-02 DIAGNOSIS — Z0000 Encounter for general adult medical examination without abnormal findings: Secondary | ICD-10-CM | POA: Diagnosis not present

## 2016-03-09 DIAGNOSIS — F419 Anxiety disorder, unspecified: Secondary | ICD-10-CM | POA: Diagnosis not present

## 2016-03-09 DIAGNOSIS — E559 Vitamin D deficiency, unspecified: Secondary | ICD-10-CM | POA: Diagnosis not present

## 2016-03-09 DIAGNOSIS — I10 Essential (primary) hypertension: Secondary | ICD-10-CM | POA: Diagnosis not present

## 2016-03-09 DIAGNOSIS — E7800 Pure hypercholesterolemia, unspecified: Secondary | ICD-10-CM | POA: Diagnosis not present

## 2016-05-17 DIAGNOSIS — M79605 Pain in left leg: Secondary | ICD-10-CM | POA: Diagnosis not present

## 2016-05-17 DIAGNOSIS — M25552 Pain in left hip: Secondary | ICD-10-CM | POA: Diagnosis not present

## 2016-05-17 DIAGNOSIS — M545 Low back pain: Secondary | ICD-10-CM | POA: Diagnosis not present

## 2016-05-17 DIAGNOSIS — M47816 Spondylosis without myelopathy or radiculopathy, lumbar region: Secondary | ICD-10-CM | POA: Diagnosis not present

## 2016-05-24 DIAGNOSIS — M549 Dorsalgia, unspecified: Secondary | ICD-10-CM | POA: Diagnosis not present

## 2016-05-24 DIAGNOSIS — M25561 Pain in right knee: Secondary | ICD-10-CM | POA: Diagnosis not present

## 2016-05-31 DIAGNOSIS — M5137 Other intervertebral disc degeneration, lumbosacral region: Secondary | ICD-10-CM | POA: Diagnosis not present

## 2016-05-31 DIAGNOSIS — M5432 Sciatica, left side: Secondary | ICD-10-CM | POA: Diagnosis not present

## 2016-06-06 ENCOUNTER — Emergency Department (HOSPITAL_COMMUNITY): Payer: Medicare Other

## 2016-06-06 ENCOUNTER — Encounter (HOSPITAL_COMMUNITY): Payer: Self-pay

## 2016-06-06 ENCOUNTER — Emergency Department (HOSPITAL_COMMUNITY)
Admission: EM | Admit: 2016-06-06 | Discharge: 2016-06-06 | Disposition: A | Discharge status: Home / Self Care | Payer: Medicare Other | Attending: Emergency Medicine | Admitting: Emergency Medicine

## 2016-06-06 DIAGNOSIS — N179 Acute kidney failure, unspecified: Secondary | ICD-10-CM | POA: Diagnosis not present

## 2016-06-06 DIAGNOSIS — Z79899 Other long term (current) drug therapy: Secondary | ICD-10-CM | POA: Insufficient documentation

## 2016-06-06 DIAGNOSIS — I10 Essential (primary) hypertension: Secondary | ICD-10-CM | POA: Diagnosis not present

## 2016-06-06 DIAGNOSIS — R531 Weakness: Secondary | ICD-10-CM | POA: Diagnosis not present

## 2016-06-06 DIAGNOSIS — Z7982 Long term (current) use of aspirin: Secondary | ICD-10-CM | POA: Insufficient documentation

## 2016-06-06 DIAGNOSIS — R404 Transient alteration of awareness: Secondary | ICD-10-CM | POA: Diagnosis not present

## 2016-06-06 DIAGNOSIS — E860 Dehydration: Secondary | ICD-10-CM | POA: Diagnosis not present

## 2016-06-06 LAB — URINALYSIS, ROUTINE W REFLEX MICROSCOPIC
Bilirubin Urine: NEGATIVE
Glucose, UA: NEGATIVE mg/dL
Hgb urine dipstick: NEGATIVE
KETONES UR: NEGATIVE mg/dL
Leukocytes, UA: NEGATIVE
NITRITE: NEGATIVE
PH: 7.5 (ref 5.0–8.0)
Protein, ur: NEGATIVE mg/dL
Specific Gravity, Urine: 1.007 (ref 1.005–1.030)

## 2016-06-06 LAB — BASIC METABOLIC PANEL
Anion gap: 11 (ref 5–15)
BUN: 17 mg/dL (ref 6–20)
CO2: 24 mmol/L (ref 22–32)
CREATININE: 0.93 mg/dL (ref 0.44–1.00)
Calcium: 10.6 mg/dL — ABNORMAL HIGH (ref 8.9–10.3)
Chloride: 101 mmol/L (ref 101–111)
GFR calc Af Amer: >60 mL/min (ref >60)
GFR, EST NON AFRICAN AMERICAN: 55 mL/min — AB (ref >60)
GLUCOSE: 119 mg/dL — AB (ref 65–99)
Potassium: 4.1 mmol/L (ref 3.5–5.1)
SODIUM: 136 mmol/L (ref 135–145)

## 2016-06-06 LAB — CBC
HCT: 43.7 % (ref 36.0–46.0)
Hemoglobin: 15.7 g/dL — ABNORMAL HIGH (ref 12.0–15.0)
MCH: 31.5 pg (ref 26.0–34.0)
MCHC: 35.9 g/dL (ref 30.0–36.0)
MCV: 87.8 fL (ref 78.0–100.0)
PLATELETS: 321 10*3/uL (ref 150–400)
RBC: 4.98 MIL/uL (ref 3.87–5.11)
RDW: 12.5 % (ref 11.5–15.5)
WBC: 9.1 10*3/uL (ref 4.0–10.5)

## 2016-06-06 LAB — CBG MONITORING, ED: Glucose-Capillary: 104 mg/dL — ABNORMAL HIGH (ref 65–99)

## 2016-06-06 LAB — TROPONIN I: Troponin I: <0.03 ng/mL (ref <0.03)

## 2016-06-06 MED ORDER — SODIUM CHLORIDE 0.9 % IV BOLUS (SEPSIS)
1000.0000 mL | Freq: Once | INTRAVENOUS | Status: AC
  Administered 2016-06-06: 1000 mL via INTRAVENOUS

## 2016-06-06 NOTE — Discharge Instructions (Signed)
Follow-up with your primary doctor and make sure that they check your thyroid function levels

## 2016-06-06 NOTE — ED Triage Notes (Signed)
Per EMS, pt from [facility] office.  Pt drove herself.  Pt has had generalized weakness.  Possible kidney injury/dehydration per labs at office.  Vitals stable.  Vitals:  138/68, hr 86, resp 18,

## 2016-06-06 NOTE — ED Provider Notes (Signed)
[facility]-EMERGENCY DEPT Provider Note   CSN: [id number] Arrival date & time: 06/06/16  1152     History   Chief Complaint Chief Complaint  Patient presents with  . Weakness    HPI Danielle Proctor is a 82 y.o. female.  HPI Patient presents to the emergency department with generalized lethargy over the last 4 days.  Patient states that Friday she noticed that she was fatigued and laid down and took a nap for 4 hours in the afternoon.  The patient states that she seems to have not gotten much relief from the nap and states that she continued to be weak and lethargic over the weekend.  The patient saw her primary care doctor today who sent her to the emergency department for possible dehydration and further evaluation.  Patient states that she did not take any medications prior to arrival.  She states that she did have a pain shot from her doctor on Tuesday.  The patient states that nothing seemed to make her condition better or worse. The patient denies chest pain, shortness of breath, headache,blurred vision, neck pain, fever, cough, , numbness, dizziness, anorexia, edema, abdominal pain, nausea, vomiting, diarrhea, rash, back pain, dysuria, hematemesis, bloody stool, near syncope, or syncope. Past Medical History:  Diagnosis Date  . Hypertension   . Thyroid disease     There are no active problems to display for this patient.   Past Surgical History:  Procedure Laterality Date  . APPENDECTOMY    . CHOLECYSTECTOMY    . COLON SURGERY      OB History    No data available       Home Medications    Prior to Admission medications   Medication Sig Start Date End Date Taking? Authorizing Provider  aspirin EC 81 MG tablet Take 81 mg by mouth daily.   Yes Historical Provider, MD  atorvastatin (LIPITOR) 20 MG tablet Take 20 mg by mouth every evening.   Yes Historical Provider, MD  bismuth subsalicylate (PEPTO BISMOL) 262 MG chewable tablet Chew 524 mg by mouth as needed for  indigestion or diarrhea or loose stools.   Yes Historical Provider, MD  cholecalciferol (VITAMIN D) 1000 UNITS tablet Take 2,000 Units by mouth daily.   Yes Historical Provider, MD  diazepam (VALIUM) 5 MG tablet Take 5 mg by mouth every 6 (six) hours as needed for anxiety.   Yes Historical Provider, MD  hydrocortisone cream 1 % Apply 1 application topically daily as needed for itching.   Yes Historical Provider, MD  levothyroxine (SYNTHROID, LEVOTHROID) 88 MCG tablet Take 88 mcg by mouth daily before breakfast.   Yes Historical Provider, MD  lisinopril (PRINIVIL,ZESTRIL) 20 MG tablet Take 20 mg by mouth every evening.  04/28/14  Yes Historical Provider, MD  Neomycin-Bacitracin-Polymyxin (HCA TRIPLE ANTIBIOTIC OINTMENT EX) Apply 1 application topically daily as needed (poison ivy.).   Yes Historical Provider, MD  simethicone (MYLICON) 80 MG chewable tablet Chew 80 mg by mouth every 6 (six) hours as needed for flatulence.   Yes Historical Provider, MD  traMADol-acetaminophen (ULTRACET) 37.5-325 MG tablet Take 1 tablet by mouth every 8 (eight) hours as needed. 05/17/16  Yes Historical Provider, MD  dicyclomine (BENTYL) 20 MG tablet Take 1 tablet (20 mg total) by mouth 3 (three) times daily as needed for spasms (abdominal pain). Patient not taking: Reported on 06/06/2016 06/22/14   Devoria AlbeIva Knapp, MD    Family History History reviewed. No pertinent family history.  Social History Social History  Substance  Use Topics  . Smoking status: Never Smoker  . Smokeless tobacco: Never Used  . Alcohol use Yes     Comment: Socially      Allergies   Sulfa antibiotics   Review of Systems Review of Systems All other systems negative except as documented in the HPI. All pertinent positives and negatives as reviewed in the HPI.  Physical Exam Updated Vital Signs BP 142/66 (BP Location: Right Arm)   Pulse 72   Temp 98.5 F (36.9 C) (Oral)   Resp 16   Ht 5' (1.524 m)   Wt 59 kg   SpO2 99%   BMI 25.39 kg/m    Physical Exam  Constitutional: She is oriented to person, place, and time. She appears well-developed and well-nourished. No distress.  HENT:  Head: Normocephalic and atraumatic.  Mouth/Throat: Oropharynx is clear and moist.  Eyes: Pupils are equal, round, and reactive to light.  Neck: Normal range of motion. Neck supple.  Cardiovascular: Normal rate, regular rhythm and normal heart sounds.  Exam reveals no gallop and no friction rub.   No murmur heard. Pulmonary/Chest: Effort normal and breath sounds normal. No respiratory distress. She has no wheezes.  Abdominal: Soft. Bowel sounds are normal. She exhibits no distension. There is no tenderness.  Neurological: She is alert and oriented to person, place, and time. She has normal strength. She exhibits normal muscle tone. Coordination and gait normal.  Skin: Skin is warm and dry. No rash noted. No erythema.  Psychiatric: She has a normal mood and affect. Her speech is normal and behavior is normal. Judgment and thought content normal. Her mood appears not anxious. Cognition and memory are normal. She does not exhibit a depressed mood.  Nursing note and vitals reviewed.    ED Treatments / Results  Labs (all labs ordered are listed, but only abnormal results are displayed) Labs Reviewed  BASIC METABOLIC PANEL - Abnormal; Notable for the following:       Result Value   Glucose, Bld 119 (*)    Calcium 10.6 (*)    GFR calc non Af Amer 55 (*)    All other components within normal limits  CBC - Abnormal; Notable for the following:    Hemoglobin 15.7 (*)    All other components within normal limits  CBG MONITORING, ED - Abnormal; Notable for the following:    Glucose-Capillary 104 (*)    All other components within normal limits  URINALYSIS, ROUTINE W REFLEX MICROSCOPIC (NOT AT [facility])  TROPONIN I    EKG  EKG Interpretation  Date/Time:  Monday June 06 2016 12:27:47 EDT Ventricular Rate:  76 PR Interval:    QRS Duration: 79 QT  Interval:  340 QTC Calculation: 383 R Axis:   53 Text Interpretation:  Sinus rhythm Low voltage, precordial leads No significant change since last tracing Confirmed by LITTLE MD, RACHEL [id number]) on 06/06/2016 4:39:01 PM       Radiology Dg Chest 2 View  Result Date: 06/06/2016 CLINICAL DATA:  Weakness EXAM: CHEST  2 VIEW COMPARISON:  None. FINDINGS: Normal heart size. Normal mediastinal contour. No pneumothorax. No pleural effusion. Lungs appear clear, with no acute consolidative airspace disease and no pulmonary edema. Moderate thoracic spondylosis. IMPRESSION: No active cardiopulmonary disease. Electronically Signed   By: Delbert Phenix M.D.   On: 06/06/2016 16:30    Procedures Procedures (including critical care time)  Medications Ordered in ED Medications  sodium chloride 0.9 % bolus 1,000 mL (0 mLs Intravenous Stopped 06/06/16 1733)  Initial Impression / Assessment and Plan / ED Course  I have reviewed the triage vital signs and the nursing notes.  Pertinent labs & imaging results that were available during my care of the patient were reviewed by me and considered in my medical decision making (see chart for details).  Clinical Course    The patient was given intravenous fluids to ensure that there is no mild dehydration.  Her laboratory testing does not show any significant abnormalities, no signs of infectious process at this point, I feel the patient is stable to be discharged with further evaluation from her primary care doctor.   Final Clinical Impressions(s) / ED Diagnoses   Final diagnoses:  None    New Prescriptions New Prescriptions   No medications on file     Charlestine NightChristopher [place] Moss, PA-C 06/06/16 1759    Laurence Spatesachel Morgan Little, MD 06/07/16 [phone]

## 2016-06-06 NOTE — ED Notes (Signed)
Bed: [facility] Expected date:  Expected time:  Means of arrival:  Comments: EMS-epigastric pain-triage

## 2016-06-14 DIAGNOSIS — K589 Irritable bowel syndrome without diarrhea: Secondary | ICD-10-CM | POA: Diagnosis not present

## 2016-06-14 DIAGNOSIS — F419 Anxiety disorder, unspecified: Secondary | ICD-10-CM | POA: Diagnosis not present

## 2016-06-14 DIAGNOSIS — N390 Urinary tract infection, site not specified: Secondary | ICD-10-CM | POA: Diagnosis not present

## 2016-06-14 DIAGNOSIS — R5383 Other fatigue: Secondary | ICD-10-CM | POA: Diagnosis not present

## 2016-06-14 DIAGNOSIS — F430 Acute stress reaction: Secondary | ICD-10-CM | POA: Diagnosis not present

## 2016-06-14 DIAGNOSIS — L237 Allergic contact dermatitis due to plants, except food: Secondary | ICD-10-CM | POA: Diagnosis not present

## 2016-06-15 DIAGNOSIS — R5383 Other fatigue: Secondary | ICD-10-CM | POA: Diagnosis not present

## 2016-06-15 DIAGNOSIS — N390 Urinary tract infection, site not specified: Secondary | ICD-10-CM | POA: Diagnosis not present

## 2016-06-16 DIAGNOSIS — R197 Diarrhea, unspecified: Secondary | ICD-10-CM | POA: Diagnosis not present

## 2016-06-28 DIAGNOSIS — G4700 Insomnia, unspecified: Secondary | ICD-10-CM | POA: Diagnosis not present

## 2016-06-28 DIAGNOSIS — F419 Anxiety disorder, unspecified: Secondary | ICD-10-CM | POA: Diagnosis not present

## 2016-06-28 DIAGNOSIS — K580 Irritable bowel syndrome with diarrhea: Secondary | ICD-10-CM | POA: Diagnosis not present

## 2016-07-12 DIAGNOSIS — K580 Irritable bowel syndrome with diarrhea: Secondary | ICD-10-CM | POA: Diagnosis not present

## 2016-07-12 DIAGNOSIS — G4700 Insomnia, unspecified: Secondary | ICD-10-CM | POA: Diagnosis not present

## 2016-07-12 DIAGNOSIS — F419 Anxiety disorder, unspecified: Secondary | ICD-10-CM | POA: Diagnosis not present

## 2016-07-22 DIAGNOSIS — K580 Irritable bowel syndrome with diarrhea: Secondary | ICD-10-CM | POA: Diagnosis not present

## 2016-07-22 DIAGNOSIS — F419 Anxiety disorder, unspecified: Secondary | ICD-10-CM | POA: Diagnosis not present

## 2016-07-22 DIAGNOSIS — G4700 Insomnia, unspecified: Secondary | ICD-10-CM | POA: Diagnosis not present

## 2016-07-22 DIAGNOSIS — Z23 Encounter for immunization: Secondary | ICD-10-CM | POA: Diagnosis not present

## 2016-07-25 DIAGNOSIS — K580 Irritable bowel syndrome with diarrhea: Secondary | ICD-10-CM | POA: Diagnosis not present

## 2016-09-07 DIAGNOSIS — I10 Essential (primary) hypertension: Secondary | ICD-10-CM | POA: Diagnosis not present

## 2016-09-07 DIAGNOSIS — E7800 Pure hypercholesterolemia, unspecified: Secondary | ICD-10-CM | POA: Diagnosis not present

## 2016-09-13 DIAGNOSIS — I10 Essential (primary) hypertension: Secondary | ICD-10-CM | POA: Diagnosis not present

## 2016-09-13 DIAGNOSIS — E039 Hypothyroidism, unspecified: Secondary | ICD-10-CM | POA: Diagnosis not present

## 2016-09-13 DIAGNOSIS — E875 Hyperkalemia: Secondary | ICD-10-CM | POA: Diagnosis not present

## 2016-09-13 DIAGNOSIS — Z0001 Encounter for general adult medical examination with abnormal findings: Secondary | ICD-10-CM | POA: Diagnosis not present

## 2016-12-29 DIAGNOSIS — G4700 Insomnia, unspecified: Secondary | ICD-10-CM | POA: Diagnosis not present

## 2017-01-31 DIAGNOSIS — E789 Disorder of lipoprotein metabolism, unspecified: Secondary | ICD-10-CM | POA: Diagnosis not present

## 2017-01-31 DIAGNOSIS — I10 Essential (primary) hypertension: Secondary | ICD-10-CM | POA: Diagnosis not present

## 2017-01-31 DIAGNOSIS — M25539 Pain in unspecified wrist: Secondary | ICD-10-CM | POA: Diagnosis not present

## 2017-01-31 DIAGNOSIS — S6992XA Unspecified injury of left wrist, hand and finger(s), initial encounter: Secondary | ICD-10-CM | POA: Diagnosis not present

## 2017-01-31 DIAGNOSIS — E039 Hypothyroidism, unspecified: Secondary | ICD-10-CM | POA: Diagnosis not present

## 2017-02-06 DIAGNOSIS — R634 Abnormal weight loss: Secondary | ICD-10-CM | POA: Diagnosis not present

## 2017-02-06 DIAGNOSIS — F419 Anxiety disorder, unspecified: Secondary | ICD-10-CM | POA: Diagnosis not present

## 2017-02-06 DIAGNOSIS — K581 Irritable bowel syndrome with constipation: Secondary | ICD-10-CM | POA: Diagnosis not present

## 2017-02-06 DIAGNOSIS — R140 Abdominal distension (gaseous): Secondary | ICD-10-CM | POA: Diagnosis not present

## 2017-03-08 DIAGNOSIS — R140 Abdominal distension (gaseous): Secondary | ICD-10-CM | POA: Diagnosis not present

## 2017-03-08 DIAGNOSIS — F419 Anxiety disorder, unspecified: Secondary | ICD-10-CM | POA: Diagnosis not present

## 2017-03-08 DIAGNOSIS — K581 Irritable bowel syndrome with constipation: Secondary | ICD-10-CM | POA: Diagnosis not present

## 2017-03-14 DIAGNOSIS — E7800 Pure hypercholesterolemia, unspecified: Secondary | ICD-10-CM | POA: Diagnosis not present

## 2017-03-16 DIAGNOSIS — E782 Mixed hyperlipidemia: Secondary | ICD-10-CM | POA: Diagnosis not present

## 2017-03-16 DIAGNOSIS — E039 Hypothyroidism, unspecified: Secondary | ICD-10-CM | POA: Diagnosis not present

## 2017-03-16 DIAGNOSIS — I10 Essential (primary) hypertension: Secondary | ICD-10-CM | POA: Diagnosis not present

## 2017-06-28 DIAGNOSIS — W57XXXA Bitten or stung by nonvenomous insect and other nonvenomous arthropods, initial encounter: Secondary | ICD-10-CM | POA: Diagnosis not present

## 2017-06-28 DIAGNOSIS — S50311A Abrasion of right elbow, initial encounter: Secondary | ICD-10-CM | POA: Diagnosis not present

## 2017-07-31 DIAGNOSIS — Z23 Encounter for immunization: Secondary | ICD-10-CM | POA: Diagnosis not present

## 2017-09-14 DIAGNOSIS — M810 Age-related osteoporosis without current pathological fracture: Secondary | ICD-10-CM | POA: Diagnosis not present

## 2017-09-14 DIAGNOSIS — I10 Essential (primary) hypertension: Secondary | ICD-10-CM | POA: Diagnosis not present

## 2017-09-14 DIAGNOSIS — E7800 Pure hypercholesterolemia, unspecified: Secondary | ICD-10-CM | POA: Diagnosis not present

## 2017-09-14 DIAGNOSIS — Z0000 Encounter for general adult medical examination without abnormal findings: Secondary | ICD-10-CM | POA: Diagnosis not present

## 2017-09-19 DIAGNOSIS — I70209 Unspecified atherosclerosis of native arteries of extremities, unspecified extremity: Secondary | ICD-10-CM | POA: Diagnosis not present

## 2017-09-19 DIAGNOSIS — R7303 Prediabetes: Secondary | ICD-10-CM | POA: Diagnosis not present

## 2017-09-19 DIAGNOSIS — Z0000 Encounter for general adult medical examination without abnormal findings: Secondary | ICD-10-CM | POA: Diagnosis not present

## 2017-09-19 DIAGNOSIS — N183 Chronic kidney disease, stage 3 (moderate): Secondary | ICD-10-CM | POA: Diagnosis not present

## 2017-09-21 ENCOUNTER — Other Ambulatory Visit (HOSPITAL_COMMUNITY): Payer: Self-pay | Admitting: Internal Medicine

## 2017-09-21 DIAGNOSIS — R1319 Other dysphagia: Secondary | ICD-10-CM

## 2017-09-26 ENCOUNTER — Ambulatory Visit (HOSPITAL_COMMUNITY): Payer: Medicare Other

## 2017-09-26 ENCOUNTER — Ambulatory Visit (HOSPITAL_COMMUNITY): Admission: RE | Admit: 2017-09-26 | Payer: Medicare Other | Source: Ambulatory Visit

## 2017-09-27 ENCOUNTER — Other Ambulatory Visit (HOSPITAL_COMMUNITY): Payer: Self-pay | Admitting: Internal Medicine

## 2017-09-27 DIAGNOSIS — R1310 Dysphagia, unspecified: Secondary | ICD-10-CM

## 2017-10-06 ENCOUNTER — Ambulatory Visit (HOSPITAL_COMMUNITY)
Admission: RE | Admit: 2017-10-06 | Discharge: 2017-10-06 | Disposition: A | Discharge status: Home / Self Care | Payer: Medicare Other | Source: Ambulatory Visit | Attending: Internal Medicine | Admitting: Internal Medicine

## 2017-10-06 DIAGNOSIS — R1314 Dysphagia, pharyngoesophageal phase: Secondary | ICD-10-CM | POA: Diagnosis not present

## 2017-10-06 DIAGNOSIS — R1310 Dysphagia, unspecified: Secondary | ICD-10-CM

## 2017-10-06 NOTE — Progress Notes (Signed)
Modified Barium Swallow Progress Note  Patient Details  Name: Danielle Proctor MRN: [id number] Date of Birth: 1929/09/22  Today's Date: 10/06/2017  Modified Barium Swallow completed.  Full report located under Chart Review in the Imaging Section.  Brief recommendations include the following:  Clinical Impression  Pt demonstrates normal swallow function. Complaints do appear consistent with possible laryngospasm. Discussed f/u with ENT or SLP for strategies, but briefly reviewed hard sniff, slow pursed lip exhalation. No diet modification needed.    Swallow Evaluation Recommendations   Recommended Consults: Consider ENT evaluation   SLP Diet Recommendations: Regular solids;Thin liquid   Liquid Administration via: Cup;Straw   Medication Administration: Whole meds with liquid   Supervision: Patient able to self feed                   Harlon DittyBonnie Clebert Wenger, MA CCC-SLP [phone]  [name], Danielle Proctor 10/06/2017,3:08 PM

## 2017-10-27 DIAGNOSIS — E782 Mixed hyperlipidemia: Secondary | ICD-10-CM | POA: Diagnosis not present

## 2017-10-31 DIAGNOSIS — Z780 Asymptomatic menopausal state: Secondary | ICD-10-CM | POA: Diagnosis not present

## 2017-10-31 DIAGNOSIS — Z1212 Encounter for screening for malignant neoplasm of rectum: Secondary | ICD-10-CM | POA: Diagnosis not present

## 2017-10-31 DIAGNOSIS — Z01419 Encounter for gynecological examination (general) (routine) without abnormal findings: Secondary | ICD-10-CM | POA: Diagnosis not present

## 2018-02-26 DIAGNOSIS — R05 Cough: Secondary | ICD-10-CM | POA: Diagnosis not present

## 2018-02-26 DIAGNOSIS — N183 Chronic kidney disease, stage 3 (moderate): Secondary | ICD-10-CM | POA: Diagnosis not present

## 2018-02-26 DIAGNOSIS — J320 Chronic maxillary sinusitis: Secondary | ICD-10-CM | POA: Diagnosis not present

## 2018-02-26 DIAGNOSIS — E782 Mixed hyperlipidemia: Secondary | ICD-10-CM | POA: Diagnosis not present

## 2018-03-07 DIAGNOSIS — K580 Irritable bowel syndrome with diarrhea: Secondary | ICD-10-CM | POA: Diagnosis not present

## 2018-06-11 DIAGNOSIS — K580 Irritable bowel syndrome with diarrhea: Secondary | ICD-10-CM | POA: Diagnosis not present

## 2018-06-11 DIAGNOSIS — R140 Abdominal distension (gaseous): Secondary | ICD-10-CM | POA: Diagnosis not present

## 2018-07-23 DIAGNOSIS — R634 Abnormal weight loss: Secondary | ICD-10-CM | POA: Diagnosis not present

## 2018-07-23 DIAGNOSIS — R140 Abdominal distension (gaseous): Secondary | ICD-10-CM | POA: Diagnosis not present

## 2018-07-27 DIAGNOSIS — Z23 Encounter for immunization: Secondary | ICD-10-CM | POA: Diagnosis not present

## 2018-08-25 ENCOUNTER — Emergency Department (HOSPITAL_COMMUNITY)
Admission: EM | Admit: 2018-08-25 | Discharge: 2018-08-25 | Disposition: A | Discharge status: Home / Self Care | Payer: Medicare Other | Attending: Emergency Medicine | Admitting: Emergency Medicine

## 2018-08-25 ENCOUNTER — Other Ambulatory Visit: Payer: Self-pay

## 2018-08-25 ENCOUNTER — Encounter (HOSPITAL_COMMUNITY): Payer: Self-pay | Admitting: *Deleted

## 2018-08-25 DIAGNOSIS — X58XXXA Exposure to other specified factors, initial encounter: Secondary | ICD-10-CM | POA: Diagnosis not present

## 2018-08-25 DIAGNOSIS — Z7982 Long term (current) use of aspirin: Secondary | ICD-10-CM | POA: Diagnosis not present

## 2018-08-25 DIAGNOSIS — Y999 Unspecified external cause status: Secondary | ICD-10-CM | POA: Diagnosis not present

## 2018-08-25 DIAGNOSIS — S60022A Contusion of left index finger without damage to nail, initial encounter: Secondary | ICD-10-CM | POA: Insufficient documentation

## 2018-08-25 DIAGNOSIS — Y939 Activity, unspecified: Secondary | ICD-10-CM | POA: Insufficient documentation

## 2018-08-25 DIAGNOSIS — E039 Hypothyroidism, unspecified: Secondary | ICD-10-CM | POA: Diagnosis not present

## 2018-08-25 DIAGNOSIS — Y929 Unspecified place or not applicable: Secondary | ICD-10-CM | POA: Insufficient documentation

## 2018-08-25 DIAGNOSIS — Z79899 Other long term (current) drug therapy: Secondary | ICD-10-CM | POA: Insufficient documentation

## 2018-08-25 DIAGNOSIS — S40021A Contusion of right upper arm, initial encounter: Secondary | ICD-10-CM | POA: Insufficient documentation

## 2018-08-25 DIAGNOSIS — I10 Essential (primary) hypertension: Secondary | ICD-10-CM

## 2018-08-25 NOTE — Discharge Instructions (Signed)
Take your lisinopril every day.  Take your blood pressure every day. Keep a record of all the readings, and take that with you when you see your doctor.  Return if your finger is getting worse.

## 2018-08-25 NOTE — ED Provider Notes (Signed)
[facility]-EMERGENCY DEPT Provider Note   CSN: [id number] Arrival date & time: 08/25/18  0047     History   Chief Complaint Chief Complaint  Patient presents with  . Hypertension    HPI Danielle Proctor is a 84 y.o. female.  The history is provided by the patient.  She has history of hypertension and came in because of discoloration of her left second finger and elevated blood pressure.  She noted some discoloration of the dorsum of the left second finger but did not remember any trauma.  It is not painful.  She is able to move it.  She does take aspirin on a daily basis.  She checked her blood pressure at home and it was significantly elevated with systolic blood pressure over 180.  She got very anxious about this.  She denies chest pain, heaviness, tightness, pressure.  She denies headache or dizziness.  She does not usually check her blood pressure.  Also, of note, she is taking lisinopril for blood pressure but is taking it every other day instead of every day.  Past Medical History:  Diagnosis Date  . Hypertension   . Thyroid disease     There are no active problems to display for this patient.   Past Surgical History:  Procedure Laterality Date  . APPENDECTOMY    . CHOLECYSTECTOMY    . COLON SURGERY       OB History   None      Home Medications    Prior to Admission medications   Medication Sig Start Date End Date Taking? Authorizing Provider  aspirin EC 81 MG tablet Take 81 mg by mouth daily.    [provider]  atorvastatin (LIPITOR) 20 MG tablet Take 20 mg by mouth every evening.    [provider]  bismuth subsalicylate (PEPTO BISMOL) 262 MG chewable tablet Chew 524 mg by mouth as needed for indigestion or diarrhea or loose stools.    [provider]  cholecalciferol (VITAMIN D) 1000 UNITS tablet Take 2,000 Units by mouth daily.    [provider]  diazepam (VALIUM) 5 MG tablet Take 5 mg by mouth  every 6 (six) hours as needed for anxiety.    [provider]  dicyclomine (BENTYL) 20 MG tablet Take 1 tablet (20 mg total) by mouth 3 (three) times daily as needed for spasms (abdominal pain). Patient not taking: Reported on 06/06/2016 06/22/14   Devoria Albe, MD  hydrocortisone cream 1 % Apply 1 application topically daily as needed for itching.    [provider]  levothyroxine (SYNTHROID, LEVOTHROID) 88 MCG tablet Take 88 mcg by mouth daily before breakfast.    [provider]  lisinopril (PRINIVIL,ZESTRIL) 20 MG tablet Take 20 mg by mouth every evening.  04/28/14   [provider]  Neomycin-Bacitracin-Polymyxin (HCA TRIPLE ANTIBIOTIC OINTMENT EX) Apply 1 application topically daily as needed (poison ivy.).    [provider]  simethicone (MYLICON) 80 MG chewable tablet Chew 80 mg by mouth every 6 (six) hours as needed for flatulence.    [provider]  traMADol-acetaminophen (ULTRACET) 37.5-325 MG tablet Take 1 tablet by mouth every 8 (eight) hours as needed. 05/17/16   [provider]    Family History No family history on file.  Social History Social History   Tobacco Use  . Smoking status: Never Smoker  . Smokeless tobacco: Never Used  Substance Use Topics  . Alcohol use: Yes    Comment: Socially   .  Drug use: No     Allergies   Sulfa antibiotics   Review of Systems Review of Systems  All other systems reviewed and are negative.    Physical Exam Updated Vital Signs BP (!) 186/93   Pulse 81   Temp 97.6 F (36.4 C) (Oral)   Resp 18   Ht 5' (1.524 m)   Wt 55.3 kg   SpO2 98%   BMI 23.83 kg/m   Physical Exam  Nursing note and vitals reviewed.  84 year old female, resting comfortably and in no acute distress. Vital signs are significant for elevated blood pressure. Oxygen saturation is 98%, which is normal. Head is normocephalic and atraumatic. PERRLA, EOMI. Oropharynx is clear. Neck is nontender and  supple without adenopathy or JVD. Back is nontender and there is no CVA tenderness. Lungs are clear without rales, wheezes, or rhonchi. Chest is nontender. Heart has regular rate and rhythm without murmur. Abdomen is soft, flat, nontender without masses or hepatosplenomegaly and peristalsis is normoactive. Extremities have no cyanosis or edema, full range of motion is present.  Heberden's nodes are present.  Ecchymosis is present over the dorsum of the left second finger proximal and middle phalanx without swelling.  There is no warmth or tenderness.  Active and passive range of motion is intact without pain. Skin is warm and dry without rash. Neurologic: Mental status is normal, cranial nerves are intact, there are no motor or sensory deficits.  ED Treatments / Results   Procedures Procedures (including critical care time)  Medications Ordered in ED Medications - No data to display   Initial Impression / Assessment and Plan / ED Course  I have reviewed the triage vital signs and the nursing notes.  Ecchymosis of the left second finger, probably from occult minor trauma.  This was explained to the patient including how aspirin administration may increase bruising.  When informed this, patient also noted another bruise on her right upper arm where she does not recall any trauma.  Blood pressure was observed in the ED, and has come down to less than 160 systolic and diastolic is now normal.  Patient was reassured that blood pressure did not have immediate danger.  She is advised to start taking lisinopril as it was prescribed, once a day.  Advised to monitor her blood pressure at home and keep a record of the readings, take that record with her when she sees her PCP.  Advised to return if the swelling in the finger seems to be getting worse, it starts getting hot or painful, or she starts developing a fever.  Final Clinical Impressions(s) / ED Diagnoses   Final diagnoses:  Contusion of left  index finger without damage to nail, initial encounter  Essential hypertension    ED Discharge Orders    None       Dione Booze, MD 08/25/18 [phone]

## 2018-08-25 NOTE — ED Triage Notes (Signed)
Pt reports after lunch she noticed that her right hand second finger was discolored and a little swollen. She denies injury.   Pt also says that about 2 hours ago her bp was high and was reading in the 180's. Pt has not been taking her BP meds as prescribed (has been taking it every other day instead of every day.) no cp, headache, sob.

## 2018-09-21 DIAGNOSIS — M810 Age-related osteoporosis without current pathological fracture: Secondary | ICD-10-CM | POA: Diagnosis not present

## 2018-09-21 DIAGNOSIS — E039 Hypothyroidism, unspecified: Secondary | ICD-10-CM | POA: Diagnosis not present

## 2018-09-21 DIAGNOSIS — E7800 Pure hypercholesterolemia, unspecified: Secondary | ICD-10-CM | POA: Diagnosis not present

## 2018-09-21 DIAGNOSIS — I10 Essential (primary) hypertension: Secondary | ICD-10-CM | POA: Diagnosis not present

## 2018-09-25 DIAGNOSIS — I70209 Unspecified atherosclerosis of native arteries of extremities, unspecified extremity: Secondary | ICD-10-CM | POA: Diagnosis not present

## 2018-09-25 DIAGNOSIS — M810 Age-related osteoporosis without current pathological fracture: Secondary | ICD-10-CM | POA: Diagnosis not present

## 2018-09-25 DIAGNOSIS — Z0000 Encounter for general adult medical examination without abnormal findings: Secondary | ICD-10-CM | POA: Diagnosis not present

## 2018-09-25 DIAGNOSIS — I10 Essential (primary) hypertension: Secondary | ICD-10-CM | POA: Diagnosis not present

## 2018-11-06 DIAGNOSIS — Z01419 Encounter for gynecological examination (general) (routine) without abnormal findings: Secondary | ICD-10-CM | POA: Diagnosis not present

## 2018-11-06 DIAGNOSIS — Z1212 Encounter for screening for malignant neoplasm of rectum: Secondary | ICD-10-CM | POA: Diagnosis not present

## 2018-11-29 DIAGNOSIS — E7800 Pure hypercholesterolemia, unspecified: Secondary | ICD-10-CM | POA: Diagnosis not present

## 2018-11-30 DIAGNOSIS — I700 Atherosclerosis of aorta: Secondary | ICD-10-CM | POA: Diagnosis not present

## 2019-07-24 DIAGNOSIS — Z23 Encounter for immunization: Secondary | ICD-10-CM | POA: Diagnosis not present

## 2019-10-07 DIAGNOSIS — Z1212 Encounter for screening for malignant neoplasm of rectum: Secondary | ICD-10-CM | POA: Diagnosis not present

## 2019-10-07 DIAGNOSIS — E782 Mixed hyperlipidemia: Secondary | ICD-10-CM | POA: Diagnosis not present

## 2019-10-07 DIAGNOSIS — I10 Essential (primary) hypertension: Secondary | ICD-10-CM | POA: Diagnosis not present

## 2019-10-07 DIAGNOSIS — Z0000 Encounter for general adult medical examination without abnormal findings: Secondary | ICD-10-CM | POA: Diagnosis not present

## 2019-10-07 DIAGNOSIS — E039 Hypothyroidism, unspecified: Secondary | ICD-10-CM | POA: Diagnosis not present

## 2019-10-08 DIAGNOSIS — E039 Hypothyroidism, unspecified: Secondary | ICD-10-CM | POA: Diagnosis not present

## 2019-10-08 DIAGNOSIS — Z0000 Encounter for general adult medical examination without abnormal findings: Secondary | ICD-10-CM | POA: Diagnosis not present

## 2019-10-08 DIAGNOSIS — M810 Age-related osteoporosis without current pathological fracture: Secondary | ICD-10-CM | POA: Diagnosis not present

## 2019-11-02 ENCOUNTER — Ambulatory Visit: Payer: Medicare Other | Attending: Internal Medicine

## 2019-11-02 DIAGNOSIS — Z23 Encounter for immunization: Secondary | ICD-10-CM | POA: Insufficient documentation

## 2019-11-02 NOTE — Progress Notes (Signed)
   Covid-19 Vaccination Clinic  Name:  Danielle Proctor    MRN: [id number] DOB: Oct 13, 1929  11/02/2019  [name] was observed post Covid-19 immunization for 15 minutes without incidence. She was provided with Vaccine Information Sheet and instruction to access the V-Safe system.   [name] was instructed to call 911 with any severe reactions post vaccine: Marland Kitchen Difficulty breathing  . Swelling of your face and throat  . A fast heartbeat  . A bad rash all over your body  . Dizziness and weakness    Immunizations Administered    Name Date Dose VIS Date Route   Pfizer COVID-19 Vaccine 11/02/2019 11:36 AM 0.3 mL 09/27/2019 Intramuscular   Manufacturer: [employer], [employer]   Lot: [id number]   NDC: [address]-4

## 2019-11-20 ENCOUNTER — Ambulatory Visit: Payer: Medicare Other

## 2019-11-23 ENCOUNTER — Ambulatory Visit: Payer: Medicare Other | Attending: Internal Medicine

## 2019-11-23 DIAGNOSIS — Z23 Encounter for immunization: Secondary | ICD-10-CM

## 2019-11-23 NOTE — Progress Notes (Signed)
   Covid-19 Vaccination Clinic  Name:  Danielle Proctor    MRN: [id number] DOB: 1928-11-21  11/23/2019  [name] was observed post Covid-19 immunization for 15 minutes without incidence. She was provided with Vaccine Information Sheet and instruction to access the V-Safe system.   [name] was instructed to call 911 with any severe reactions post vaccine: Marland Kitchen Difficulty breathing  . Swelling of your face and throat  . A fast heartbeat  . A bad rash all over your body  . Dizziness and weakness    Immunizations Administered    Name Date Dose VIS Date Route   Pfizer COVID-19 Vaccine 11/23/2019 11:00 AM 0.3 mL 09/27/2019 Intramuscular   Manufacturer: [employer], [employer]   Lot: [id number]   NDC: [address]-4

## 2019-11-24 ENCOUNTER — Ambulatory Visit: Payer: Medicare Other

## 2019-12-03 DIAGNOSIS — M159 Polyosteoarthritis, unspecified: Secondary | ICD-10-CM | POA: Diagnosis not present

## 2019-12-03 DIAGNOSIS — M810 Age-related osteoporosis without current pathological fracture: Secondary | ICD-10-CM | POA: Diagnosis not present

## 2019-12-11 DIAGNOSIS — M19042 Primary osteoarthritis, left hand: Secondary | ICD-10-CM | POA: Diagnosis not present

## 2019-12-11 DIAGNOSIS — M19072 Primary osteoarthritis, left ankle and foot: Secondary | ICD-10-CM | POA: Diagnosis not present

## 2019-12-11 DIAGNOSIS — M19071 Primary osteoarthritis, right ankle and foot: Secondary | ICD-10-CM | POA: Diagnosis not present

## 2019-12-11 DIAGNOSIS — M79671 Pain in right foot: Secondary | ICD-10-CM | POA: Diagnosis not present

## 2019-12-11 DIAGNOSIS — M79641 Pain in right hand: Secondary | ICD-10-CM | POA: Diagnosis not present

## 2019-12-11 DIAGNOSIS — M1990 Unspecified osteoarthritis, unspecified site: Secondary | ICD-10-CM | POA: Diagnosis not present

## 2019-12-11 DIAGNOSIS — M19041 Primary osteoarthritis, right hand: Secondary | ICD-10-CM | POA: Diagnosis not present

## 2019-12-11 DIAGNOSIS — M2550 Pain in unspecified joint: Secondary | ICD-10-CM | POA: Diagnosis not present

## 2019-12-11 DIAGNOSIS — M79672 Pain in left foot: Secondary | ICD-10-CM | POA: Diagnosis not present

## 2019-12-11 DIAGNOSIS — M79642 Pain in left hand: Secondary | ICD-10-CM | POA: Diagnosis not present

## 2019-12-11 DIAGNOSIS — M79673 Pain in unspecified foot: Secondary | ICD-10-CM | POA: Diagnosis not present

## 2019-12-11 DIAGNOSIS — M79643 Pain in unspecified hand: Secondary | ICD-10-CM | POA: Diagnosis not present

## 2019-12-18 DIAGNOSIS — K580 Irritable bowel syndrome with diarrhea: Secondary | ICD-10-CM | POA: Diagnosis not present

## 2020-04-02 DIAGNOSIS — E782 Mixed hyperlipidemia: Secondary | ICD-10-CM | POA: Diagnosis not present

## 2020-04-07 DIAGNOSIS — E782 Mixed hyperlipidemia: Secondary | ICD-10-CM | POA: Diagnosis not present

## 2020-04-07 DIAGNOSIS — I10 Essential (primary) hypertension: Secondary | ICD-10-CM | POA: Diagnosis not present

## 2020-04-07 DIAGNOSIS — I700 Atherosclerosis of aorta: Secondary | ICD-10-CM | POA: Diagnosis not present

## 2020-04-07 DIAGNOSIS — I739 Peripheral vascular disease, unspecified: Secondary | ICD-10-CM | POA: Diagnosis not present

## 2020-08-03 DIAGNOSIS — Z23 Encounter for immunization: Secondary | ICD-10-CM | POA: Diagnosis not present

## 2020-10-07 DIAGNOSIS — E039 Hypothyroidism, unspecified: Secondary | ICD-10-CM | POA: Diagnosis not present

## 2020-10-07 DIAGNOSIS — E7800 Pure hypercholesterolemia, unspecified: Secondary | ICD-10-CM | POA: Diagnosis not present

## 2020-10-07 DIAGNOSIS — M810 Age-related osteoporosis without current pathological fracture: Secondary | ICD-10-CM | POA: Diagnosis not present

## 2020-10-07 DIAGNOSIS — I10 Essential (primary) hypertension: Secondary | ICD-10-CM | POA: Diagnosis not present

## 2020-10-13 DIAGNOSIS — I70209 Unspecified atherosclerosis of native arteries of extremities, unspecified extremity: Secondary | ICD-10-CM | POA: Diagnosis not present

## 2020-10-13 DIAGNOSIS — Z0000 Encounter for general adult medical examination without abnormal findings: Secondary | ICD-10-CM | POA: Diagnosis not present

## 2020-10-13 DIAGNOSIS — E7800 Pure hypercholesterolemia, unspecified: Secondary | ICD-10-CM | POA: Diagnosis not present

## 2020-10-13 DIAGNOSIS — I700 Atherosclerosis of aorta: Secondary | ICD-10-CM | POA: Diagnosis not present

## 2020-11-09 DIAGNOSIS — I700 Atherosclerosis of aorta: Secondary | ICD-10-CM | POA: Diagnosis not present

## 2020-11-12 DIAGNOSIS — E7800 Pure hypercholesterolemia, unspecified: Secondary | ICD-10-CM | POA: Diagnosis not present

## 2020-11-12 DIAGNOSIS — I739 Peripheral vascular disease, unspecified: Secondary | ICD-10-CM | POA: Diagnosis not present

## 2020-11-12 DIAGNOSIS — I10 Essential (primary) hypertension: Secondary | ICD-10-CM | POA: Diagnosis not present

## 2020-11-12 DIAGNOSIS — I700 Atherosclerosis of aorta: Secondary | ICD-10-CM | POA: Diagnosis not present

## 2020-12-29 DIAGNOSIS — R634 Abnormal weight loss: Secondary | ICD-10-CM | POA: Diagnosis not present

## 2021-01-14 DIAGNOSIS — E7800 Pure hypercholesterolemia, unspecified: Secondary | ICD-10-CM | POA: Diagnosis not present

## 2021-01-14 DIAGNOSIS — I129 Hypertensive chronic kidney disease with stage 1 through stage 4 chronic kidney disease, or unspecified chronic kidney disease: Secondary | ICD-10-CM | POA: Diagnosis not present

## 2021-01-14 DIAGNOSIS — N183 Chronic kidney disease, stage 3 unspecified: Secondary | ICD-10-CM | POA: Diagnosis not present

## 2021-01-14 DIAGNOSIS — E039 Hypothyroidism, unspecified: Secondary | ICD-10-CM | POA: Diagnosis not present

## 2021-02-13 DIAGNOSIS — N183 Chronic kidney disease, stage 3 unspecified: Secondary | ICD-10-CM | POA: Diagnosis not present

## 2021-02-13 DIAGNOSIS — E7800 Pure hypercholesterolemia, unspecified: Secondary | ICD-10-CM | POA: Diagnosis not present

## 2021-02-13 DIAGNOSIS — I129 Hypertensive chronic kidney disease with stage 1 through stage 4 chronic kidney disease, or unspecified chronic kidney disease: Secondary | ICD-10-CM | POA: Diagnosis not present

## 2021-02-13 DIAGNOSIS — E039 Hypothyroidism, unspecified: Secondary | ICD-10-CM | POA: Diagnosis not present

## 2021-04-15 DIAGNOSIS — E7800 Pure hypercholesterolemia, unspecified: Secondary | ICD-10-CM | POA: Diagnosis not present

## 2021-04-15 DIAGNOSIS — N183 Chronic kidney disease, stage 3 unspecified: Secondary | ICD-10-CM | POA: Diagnosis not present

## 2021-04-15 DIAGNOSIS — E039 Hypothyroidism, unspecified: Secondary | ICD-10-CM | POA: Diagnosis not present

## 2021-04-15 DIAGNOSIS — I129 Hypertensive chronic kidney disease with stage 1 through stage 4 chronic kidney disease, or unspecified chronic kidney disease: Secondary | ICD-10-CM | POA: Diagnosis not present

## 2021-05-16 DIAGNOSIS — I129 Hypertensive chronic kidney disease with stage 1 through stage 4 chronic kidney disease, or unspecified chronic kidney disease: Secondary | ICD-10-CM | POA: Diagnosis not present

## 2021-05-16 DIAGNOSIS — N183 Chronic kidney disease, stage 3 unspecified: Secondary | ICD-10-CM | POA: Diagnosis not present

## 2021-05-16 DIAGNOSIS — E039 Hypothyroidism, unspecified: Secondary | ICD-10-CM | POA: Diagnosis not present

## 2021-05-16 DIAGNOSIS — E7800 Pure hypercholesterolemia, unspecified: Secondary | ICD-10-CM | POA: Diagnosis not present

## 2021-05-27 DIAGNOSIS — E782 Mixed hyperlipidemia: Secondary | ICD-10-CM | POA: Diagnosis not present

## 2021-06-16 DIAGNOSIS — N183 Chronic kidney disease, stage 3 unspecified: Secondary | ICD-10-CM | POA: Diagnosis not present

## 2021-06-16 DIAGNOSIS — E039 Hypothyroidism, unspecified: Secondary | ICD-10-CM | POA: Diagnosis not present

## 2021-06-16 DIAGNOSIS — E7800 Pure hypercholesterolemia, unspecified: Secondary | ICD-10-CM | POA: Diagnosis not present

## 2021-06-16 DIAGNOSIS — I129 Hypertensive chronic kidney disease with stage 1 through stage 4 chronic kidney disease, or unspecified chronic kidney disease: Secondary | ICD-10-CM | POA: Diagnosis not present

## 2021-07-16 DIAGNOSIS — E7800 Pure hypercholesterolemia, unspecified: Secondary | ICD-10-CM | POA: Diagnosis not present

## 2021-07-16 DIAGNOSIS — N183 Chronic kidney disease, stage 3 unspecified: Secondary | ICD-10-CM | POA: Diagnosis not present

## 2021-07-16 DIAGNOSIS — I129 Hypertensive chronic kidney disease with stage 1 through stage 4 chronic kidney disease, or unspecified chronic kidney disease: Secondary | ICD-10-CM | POA: Diagnosis not present

## 2021-07-16 DIAGNOSIS — E039 Hypothyroidism, unspecified: Secondary | ICD-10-CM | POA: Diagnosis not present

## 2021-07-29 ENCOUNTER — Emergency Department (HOSPITAL_COMMUNITY)
Admission: EM | Admit: 2021-07-29 | Discharge: 2021-07-29 | Disposition: A | Discharge status: Home / Self Care | Payer: Medicare Other | Attending: Emergency Medicine | Admitting: Emergency Medicine

## 2021-07-29 ENCOUNTER — Encounter (HOSPITAL_COMMUNITY): Payer: Self-pay

## 2021-07-29 DIAGNOSIS — Z79899 Other long term (current) drug therapy: Secondary | ICD-10-CM | POA: Insufficient documentation

## 2021-07-29 DIAGNOSIS — Z7982 Long term (current) use of aspirin: Secondary | ICD-10-CM | POA: Insufficient documentation

## 2021-07-29 DIAGNOSIS — R030 Elevated blood-pressure reading, without diagnosis of hypertension: Secondary | ICD-10-CM | POA: Diagnosis present

## 2021-07-29 DIAGNOSIS — I10 Essential (primary) hypertension: Secondary | ICD-10-CM | POA: Diagnosis not present

## 2021-07-29 MED ORDER — LORAZEPAM 0.5 MG PO TABS: 0.2500 mg | ORAL_TABLET | Freq: Three times a day (TID) | ORAL | 0 refills | Status: DC | PRN

## 2021-07-29 NOTE — ED Provider Notes (Signed)
[facility]-EMERGENCY DEPT Provider Note   CSN: [id number] Arrival date & time: 07/29/21  0121     History Chief complaint: Elevated blood pressure  Danielle Proctor is a 87 y.o. female.  The history is provided by the patient.  She has history of hypertension and states that her blood pressure was elevated at home.  She is not sure how high it was.  She has been having her blood pressure checked every other day since getting started on atorvastatin.  Blood pressure had been satisfactory until today.  She denies headache, tinnitus, epistaxis, chest pain.  She does state that she had a lot of dental work done over the last week, and has not been sleeping.  She was supposed to get a refill of a prescription for lorazepam, but states that there was a problem and the prescription was not able to be filled.   Past Medical History:  Diagnosis Date   Hypertension    Thyroid disease     There are no problems to display for this patient.   Past Surgical History:  Procedure Laterality Date   APPENDECTOMY     CHOLECYSTECTOMY     COLON SURGERY       OB History   No obstetric history on file.     History reviewed. No pertinent family history.  Social History   Tobacco Use   Smoking status: Never   Smokeless tobacco: Never  Substance Use Topics   Alcohol use: Yes    Comment: Socially    Drug use: No    Home Medications Prior to Admission medications   Medication Sig Start Date End Date Taking? Authorizing Provider  aspirin EC 81 MG tablet Take 81 mg by mouth daily.    [provider]  atorvastatin (LIPITOR) 20 MG tablet Take 20 mg by mouth every evening.    [provider]  bismuth subsalicylate (PEPTO BISMOL) 262 MG chewable tablet Chew 524 mg by mouth as needed for indigestion or diarrhea or loose stools.    [provider]  cholecalciferol (VITAMIN D) 1000 UNITS tablet Take 2,000 Units by mouth daily.    [provider]  diazepam (VALIUM) 5 MG tablet Take 5 mg by mouth every 6 (six) hours as needed for anxiety.    [provider]  hydrocortisone cream 1 % Apply 1 application topically daily as needed for itching.    [provider]  levothyroxine (SYNTHROID, LEVOTHROID) 88 MCG tablet Take 88 mcg by mouth daily before breakfast.    [provider]  lisinopril (PRINIVIL,ZESTRIL) 20 MG tablet Take 20 mg by mouth every evening.  04/28/14   [provider]  simethicone (MYLICON) 80 MG chewable tablet Chew 80 mg by mouth every 6 (six) hours as needed for flatulence.    [provider]    Allergies    Sulfa antibiotics  Review of Systems   Review of Systems  All other systems reviewed and are negative.  Physical Exam Updated Vital Signs BP (!) 182/77 (BP Location: Left Arm)   Pulse 91   Temp 97.9 F (36.6 C) (Oral)   Resp 13   Ht 5' (1.524 m)   Wt 53.1 kg   SpO2 99%   BMI 22.85 kg/m   Physical Exam Vitals and nursing note reviewed.  87 year old female, resting comfortably and in no acute distress. Vital signs are significant for elevated blood pressure. Oxygen saturation is 99%, which is normal. Head is normocephalic  and atraumatic. PERRLA, EOMI. Oropharynx is clear. Neck is nontender and supple without adenopathy or JVD. Back is nontender and there is no CVA tenderness. Lungs are clear without rales, wheezes, or rhonchi. Chest is nontender. Heart has regular rate and rhythm without murmur. Abdomen is soft, flat, nontender. Extremities have no cyanosis or edema, full range of motion is present. Skin is warm and dry without rash. Neurologic: Mental status is normal, cranial nerves are intact, there are no motor or sensory deficits.  ED Results / Procedures / Treatments    Procedures Procedures   Medications Ordered in ED Medications - No data to display  ED Course  I have reviewed the triage vital signs and the nursing  notes.  MDM Rules/Calculators/A&P                         Elevated blood pressure in patient with history of hypertension.  Blood pressure had apparently been well controlled until today.  There is no indication endorgan damage, most appropriate course at this point is to continue outpatient monitoring.  I have discussed this with the patient.  She also states that she recently tried to refill a prescription for lorazepam which she has been taking at bedtime, but the prescription was not able to be refilled.  I reviewed the records on the [place] controlled substance reporting website, and she has been getting prescriptions for lorazepam 0.5 mg which was last filled 1 month ago.  She is given prescription for small number of lorazepam tablets to use until she is able to get the prescription from her primary care provider refilled.  Old records are reviewed, and she had an ED visit in 2019 will with similar complaints of elevated blood pressure.  Final Clinical Impression(s) / ED Diagnoses Final diagnoses:  Elevated blood pressure reading with diagnosis of hypertension    Rx / DC Orders ED Discharge Orders     None        Dione Booze, MD 07/29/21 [phone]

## 2021-07-29 NOTE — ED Triage Notes (Signed)
Pt complains of her blood pressure being elevated today She states that she had dental work Monday and Tuesday Pt denies any dizziness headaches, or blurred vision

## 2021-07-29 NOTE — Discharge Instructions (Addendum)
Please continue to monitor your blood pressure at home.  If your blood pressure is staying consistently elevated, call your primary care provider to get instructions on how to adjust your blood pressure medications.

## 2021-08-04 DIAGNOSIS — I10 Essential (primary) hypertension: Secondary | ICD-10-CM | POA: Diagnosis not present

## 2021-08-04 DIAGNOSIS — Z23 Encounter for immunization: Secondary | ICD-10-CM | POA: Diagnosis not present

## 2021-08-04 DIAGNOSIS — F419 Anxiety disorder, unspecified: Secondary | ICD-10-CM | POA: Diagnosis not present

## 2021-08-04 DIAGNOSIS — Z09 Encounter for follow-up examination after completed treatment for conditions other than malignant neoplasm: Secondary | ICD-10-CM | POA: Diagnosis not present

## 2021-08-16 DIAGNOSIS — E039 Hypothyroidism, unspecified: Secondary | ICD-10-CM | POA: Diagnosis not present

## 2021-08-16 DIAGNOSIS — N183 Chronic kidney disease, stage 3 unspecified: Secondary | ICD-10-CM | POA: Diagnosis not present

## 2021-08-16 DIAGNOSIS — E7800 Pure hypercholesterolemia, unspecified: Secondary | ICD-10-CM | POA: Diagnosis not present

## 2021-08-16 DIAGNOSIS — I129 Hypertensive chronic kidney disease with stage 1 through stage 4 chronic kidney disease, or unspecified chronic kidney disease: Secondary | ICD-10-CM | POA: Diagnosis not present

## 2021-08-19 DIAGNOSIS — I10 Essential (primary) hypertension: Secondary | ICD-10-CM | POA: Diagnosis not present

## 2021-08-19 DIAGNOSIS — R350 Frequency of micturition: Secondary | ICD-10-CM | POA: Diagnosis not present

## 2021-08-19 DIAGNOSIS — R42 Dizziness and giddiness: Secondary | ICD-10-CM | POA: Diagnosis not present

## 2021-09-15 DIAGNOSIS — N183 Chronic kidney disease, stage 3 unspecified: Secondary | ICD-10-CM | POA: Diagnosis not present

## 2021-09-15 DIAGNOSIS — E7800 Pure hypercholesterolemia, unspecified: Secondary | ICD-10-CM | POA: Diagnosis not present

## 2021-09-15 DIAGNOSIS — I129 Hypertensive chronic kidney disease with stage 1 through stage 4 chronic kidney disease, or unspecified chronic kidney disease: Secondary | ICD-10-CM | POA: Diagnosis not present

## 2021-09-15 DIAGNOSIS — E039 Hypothyroidism, unspecified: Secondary | ICD-10-CM | POA: Diagnosis not present

## 2021-10-19 DIAGNOSIS — E7801 Familial hypercholesterolemia: Secondary | ICD-10-CM | POA: Diagnosis not present

## 2021-10-19 DIAGNOSIS — I10 Essential (primary) hypertension: Secondary | ICD-10-CM | POA: Diagnosis not present

## 2021-10-19 DIAGNOSIS — E039 Hypothyroidism, unspecified: Secondary | ICD-10-CM | POA: Diagnosis not present

## 2021-10-19 DIAGNOSIS — M810 Age-related osteoporosis without current pathological fracture: Secondary | ICD-10-CM | POA: Diagnosis not present

## 2021-10-19 DIAGNOSIS — R7303 Prediabetes: Secondary | ICD-10-CM | POA: Diagnosis not present

## 2021-10-22 DIAGNOSIS — I700 Atherosclerosis of aorta: Secondary | ICD-10-CM | POA: Diagnosis not present

## 2021-10-22 DIAGNOSIS — Z01419 Encounter for gynecological examination (general) (routine) without abnormal findings: Secondary | ICD-10-CM | POA: Diagnosis not present

## 2021-10-22 DIAGNOSIS — E875 Hyperkalemia: Secondary | ICD-10-CM | POA: Diagnosis not present

## 2021-10-22 DIAGNOSIS — E039 Hypothyroidism, unspecified: Secondary | ICD-10-CM | POA: Diagnosis not present

## 2021-10-22 DIAGNOSIS — E559 Vitamin D deficiency, unspecified: Secondary | ICD-10-CM | POA: Diagnosis not present

## 2021-10-22 DIAGNOSIS — N6323 Unspecified lump in the left breast, lower outer quadrant: Secondary | ICD-10-CM | POA: Diagnosis not present

## 2021-10-22 DIAGNOSIS — Z0000 Encounter for general adult medical examination without abnormal findings: Secondary | ICD-10-CM | POA: Diagnosis not present

## 2021-10-22 DIAGNOSIS — I10 Essential (primary) hypertension: Secondary | ICD-10-CM | POA: Diagnosis not present

## 2021-10-30 ENCOUNTER — Other Ambulatory Visit: Payer: Self-pay | Admitting: Registered Nurse

## 2021-11-08 ENCOUNTER — Other Ambulatory Visit: Payer: Self-pay | Admitting: Registered Nurse

## 2021-11-08 DIAGNOSIS — N6320 Unspecified lump in the left breast, unspecified quadrant: Secondary | ICD-10-CM

## 2022-01-12 ENCOUNTER — Emergency Department (HOSPITAL_COMMUNITY): Payer: Medicare HMO

## 2022-01-12 ENCOUNTER — Emergency Department (HOSPITAL_COMMUNITY)
Admission: EM | Admit: 2022-01-12 | Discharge: 2022-01-13 | Disposition: A | Discharge status: Home / Self Care | Payer: Medicare HMO | Attending: Emergency Medicine | Admitting: Emergency Medicine

## 2022-01-12 DIAGNOSIS — I700 Atherosclerosis of aorta: Secondary | ICD-10-CM | POA: Diagnosis not present

## 2022-01-12 DIAGNOSIS — K5901 Slow transit constipation: Secondary | ICD-10-CM | POA: Insufficient documentation

## 2022-01-12 DIAGNOSIS — I10 Essential (primary) hypertension: Secondary | ICD-10-CM | POA: Diagnosis not present

## 2022-01-12 DIAGNOSIS — Z79899 Other long term (current) drug therapy: Secondary | ICD-10-CM | POA: Insufficient documentation

## 2022-01-12 DIAGNOSIS — K625 Hemorrhage of anus and rectum: Secondary | ICD-10-CM | POA: Diagnosis not present

## 2022-01-12 DIAGNOSIS — R739 Hyperglycemia, unspecified: Secondary | ICD-10-CM | POA: Diagnosis not present

## 2022-01-12 DIAGNOSIS — K5900 Constipation, unspecified: Secondary | ICD-10-CM | POA: Diagnosis not present

## 2022-01-12 DIAGNOSIS — Z7982 Long term (current) use of aspirin: Secondary | ICD-10-CM | POA: Diagnosis not present

## 2022-01-12 DIAGNOSIS — R109 Unspecified abdominal pain: Secondary | ICD-10-CM | POA: Diagnosis not present

## 2022-01-12 LAB — COMPREHENSIVE METABOLIC PANEL
ALT: 15 U/L (ref 0–44)
AST: 22 U/L (ref 15–41)
Albumin: 4.2 g/dL (ref 3.5–5.0)
Alkaline Phosphatase: 19 U/L — ABNORMAL LOW (ref 38–126)
Anion gap: 10 (ref 5–15)
BUN: 13 mg/dL (ref 8–23)
CO2: 25 mmol/L (ref 22–32)
Calcium: 9.8 mg/dL (ref 8.9–10.3)
Chloride: 103 mmol/L (ref 98–111)
Creatinine, Ser: 1.1 mg/dL — ABNORMAL HIGH (ref 0.44–1.00)
GFR, Estimated: 48 mL/min — ABNORMAL LOW (ref >60)
Glucose, Bld: 116 mg/dL — ABNORMAL HIGH (ref 70–99)
Potassium: 3.7 mmol/L (ref 3.5–5.1)
Sodium: 138 mmol/L (ref 135–145)
Total Bilirubin: 0.8 mg/dL (ref 0.3–1.2)
Total Protein: 7.1 g/dL (ref 6.5–8.1)

## 2022-01-12 LAB — CBC WITH DIFFERENTIAL/PLATELET
Abs Immature Granulocytes: 0.02 10*3/uL (ref 0.00–0.07)
Basophils Absolute: 0.1 10*3/uL (ref 0.0–0.1)
Basophils Relative: 1 %
Eosinophils Absolute: 0 10*3/uL (ref 0.0–0.5)
Eosinophils Relative: 0 %
HCT: 41.7 % (ref 36.0–46.0)
Hemoglobin: 14.2 g/dL (ref 12.0–15.0)
Immature Granulocytes: 0 %
Lymphocytes Relative: 31 %
Lymphs Abs: 2.5 10*3/uL (ref 0.7–4.0)
MCH: 30.5 pg (ref 26.0–34.0)
MCHC: 34.1 g/dL (ref 30.0–36.0)
MCV: 89.5 fL (ref 80.0–100.0)
Monocytes Absolute: 0.6 10*3/uL (ref 0.1–1.0)
Monocytes Relative: 8 %
Neutro Abs: 4.9 10*3/uL (ref 1.7–7.7)
Neutrophils Relative %: 60 %
Platelets: 304 10*3/uL (ref 150–400)
RBC: 4.66 MIL/uL (ref 3.87–5.11)
RDW: 12.4 % (ref 11.5–15.5)
WBC: 8.1 10*3/uL (ref 4.0–10.5)
nRBC: 0 % (ref 0.0–0.2)

## 2022-01-12 LAB — LIPASE, BLOOD: Lipase: 37 U/L (ref 11–51)

## 2022-01-12 NOTE — ED Triage Notes (Signed)
Pt c/o constipation x3 days, feeling of fullness/bloating, poor appetite. Advised by son to take enema, states she saw associated bleeding. Denies abd pain, no fevers, SHOB ?

## 2022-01-12 NOTE — ED Provider Triage Note (Addendum)
Emergency Medicine Provider Triage Evaluation Note ? ?Danielle Proctor , a 88 y.o. female  was evaluated in triage.  Pt complains of constipation over the past 3 days.  This is associated with a mild sensation of fullness in her abdomen, decreased appetite.  She took an enema and noticed a small amount of red blood at the end but did not produce much stool.  No vomiting.  She has a history of colon surgery, cholecystectomy, appendectomy. ? ?Review of Systems  ?Positive: Constipation ?Negative: Urinary symptoms, fever ? ?Physical Exam  ?BP (!) 172/67 (BP Location: Right Arm)   Pulse 79   Temp 98 ?F (36.7 ?C) (Oral)   Resp 18   SpO2 98%  ?Gen:   Awake, no distress   ?Resp:  Normal effort  ?MSK:   Moves extremities without difficulty  ?Other:  Abdomen soft, nontender, nondistended ? ?Medical Decision Making  ?Medically screening exam initiated at 10:11 PM.  Appropriate orders placed.  Danielle Proctor was informed that the remainder of the evaluation will be completed by another provider, this initial triage assessment does not replace that evaluation, and the importance of remaining in the ED until their evaluation is complete. ? ?Blood work ordered, will start with plain films.  Defer CT decision to next provider. ? ?11:16 PM patient is in waiting room.  I personally visualized and reviewed abdominal x-ray.  Some stool but no obvious large stool burden.  No obvious fecal impaction.  Nonobstructive pattern.  CT abdomen pelvis ordered for further evaluation. ?  ?Carlisle Cater, PA-C ?01/12/22 2212 ? ?  ?Carlisle Cater, PA-C ?01/12/22 2317 ? ?

## 2022-01-13 ENCOUNTER — Emergency Department (HOSPITAL_COMMUNITY): Payer: Medicare HMO

## 2022-01-13 DIAGNOSIS — R109 Unspecified abdominal pain: Secondary | ICD-10-CM | POA: Diagnosis not present

## 2022-01-13 DIAGNOSIS — I700 Atherosclerosis of aorta: Secondary | ICD-10-CM | POA: Diagnosis not present

## 2022-01-13 MED ORDER — IOHEXOL 300 MG/ML  SOLN
80.0000 mL | Freq: Once | INTRAMUSCULAR | Status: AC | PRN
  Administered 2022-01-13: 80 mL via INTRAVENOUS

## 2022-01-13 MED ORDER — DOCUSATE SODIUM 50 MG PO CAPS: 50.0000 mg | ORAL_CAPSULE | Freq: Two times a day (BID) | ORAL | 0 refills | Status: DC

## 2022-01-13 MED ORDER — FENTANYL CITRATE PF 50 MCG/ML IJ SOSY: 25.0000 ug | PREFILLED_SYRINGE | Freq: Once | INTRAMUSCULAR | Status: DC

## 2022-01-13 NOTE — ED Provider Notes (Signed)
?[name] EMERGENCY DEPARTMENT ?Provider Note ? ? ?CSN: [id number] ?Arrival date & time: 01/12/22  2143 ? ?  ? ?History ? ?Chief Complaint  ?Patient presents with  ? Constipation  ? ? ?Danielle Proctor is a 88 y.o. female. ? ?The history is provided by the patient and a relative.  ?Constipation ?Severity:  Moderate ?Time since last bowel movement:  3 days ?Timing:  Constant ?Progression:  Worsening ?Chronicity:  New ?Relieved by:  Nothing ?Worsened by:  Nothing ?Associated symptoms: abdominal pain and hematochezia   ?Associated symptoms: no diarrhea, no dysuria, no fever, no nausea and no vomiting   ?Patient reports constipation for the past 3 days.  She reports no bowel movement at all, though she did have some bleeding from her rectum earlier.  She does not take any daily anticoagulation. ?No vomiting. ?She has minimal abdominal pain ?  ?Past Medical History:  ?Diagnosis Date  ? Hypertension   ? Thyroid disease   ? ? ?Home Medications ?Prior to Admission medications   ?Medication Sig Start Date End Date Taking? Authorizing Provider  ?docusate sodium (COLACE) 50 MG capsule Take 1 capsule (50 mg total) by mouth 2 (two) times daily. 01/13/22  Yes Zadie Rhine, MD  ?aspirin EC 81 MG tablet Take 81 mg by mouth daily.    [provider]  ?atorvastatin (LIPITOR) 20 MG tablet Take 20 mg by mouth every evening.    [provider]  ?cholecalciferol (VITAMIN D) 1000 UNITS tablet Take 2,000 Units by mouth daily.    [provider]  ?hydrocortisone cream 1 % Apply 1 application topically daily as needed for itching.    [provider]  ?levothyroxine (SYNTHROID, LEVOTHROID) 88 MCG tablet Take 88 mcg by mouth daily before breakfast.    [provider]  ?lisinopril (PRINIVIL,ZESTRIL) 20 MG tablet Take 20 mg by mouth every evening.  04/28/14   [provider]  ?LORazepam (ATIVAN) 0.5 MG tablet Take 0.5 tablets (0.25 mg total) by mouth every 8 (eight)  hours as needed for anxiety. 07/29/21   Dione Booze, MD  ?simethicone (MYLICON) 80 MG chewable tablet Chew 80 mg by mouth every 6 (six) hours as needed for flatulence.    [provider]  ?   ? ?Allergies    ?Sulfa antibiotics   ? ?Review of Systems   ?Review of Systems  ?Constitutional:  Negative for fever.  ?Gastrointestinal:  Positive for abdominal pain, constipation and hematochezia. Negative for diarrhea, nausea and vomiting.  ?Genitourinary:  Negative for dysuria.  ?All other systems reviewed and are negative. ? ?Physical Exam ?Updated Vital Signs ?BP (!) 151/73   Pulse 70   Temp 98 ?F (36.7 ?C) (Oral)   Resp 16   SpO2 96%  ?Physical Exam ?CONSTITUTIONAL: Elderly, no acute distress ?HEAD: Normocephalic/atraumatic ?EYES: EOMI ?ENMT: Mucous membranes moist ?NECK: supple no meningeal signs ?CV: S1/S2 noted, no murmurs/rubs/gallops noted ?LUNGS: Lungs are clear to auscultation bilaterally, no apparent distress ?ABDOMEN: soft, nontender, no rebound or guarding, bowel sounds noted throughout abdomen ?Rectal-multiple external hemorrhoids are noted, no rectal mass.  No stool in the rectal vault.  No blood or melena.  chaperone present for exam ?GU:no cva tenderness ?NEURO: Pt is awake/alert/appropriate, moves all extremitiesx4.  No facial droop.   ?EXTREMITIES: pulses normal/equal, full ROM ?SKIN: warm, color normal ?PSYCH: no abnormalities of mood noted, alert and oriented to situation ? ?ED Results / Procedures / Treatments   ?Labs ?(all labs ordered are listed, but only abnormal results  are displayed) ?Labs Reviewed  ?COMPREHENSIVE METABOLIC PANEL - Abnormal; Notable for the following components:  ?    Result Value  ? Glucose, Bld 116 (*)   ? Creatinine, Ser 1.10 (*)   ? Alkaline Phosphatase 19 (*)   ? GFR, Estimated 48 (*)   ? All other components within normal limits  ?CBC WITH DIFFERENTIAL/PLATELET  ?LIPASE, BLOOD  ? ? ?EKG ?None ? ?Radiology ?CT ABDOMEN PELVIS W CONTRAST ? ?Result Date:  01/13/2022 ?CLINICAL DATA:  Abdominal pain, constipation, bloating and poor appetite. Bleeding following enema. EXAM: CT ABDOMEN AND PELVIS WITH CONTRAST TECHNIQUE: Multidetector CT imaging of the abdomen and pelvis was performed using the standard protocol following bolus administration of intravenous contrast. RADIATION DOSE REDUCTION: This exam was performed according to the departmental dose-optimization program which includes automated exposure control, adjustment of the mA and/or kV according to patient size and/or use of iterative reconstruction technique. CONTRAST:  80mL OMNIPAQUE IOHEXOL 300 MG/ML  SOLN COMPARISON:  06/22/2014. FINDINGS: Lower chest: No acute abnormality. Hepatobiliary: There is focal fatty infiltration in the liver adjacent to the falciform ligament. The common bile duct is mildly distended which is likely related to post cholecystectomy status. Pancreas: Unremarkable. No pancreatic ductal dilatation or surrounding inflammatory changes. Spleen: Normal in size without focal abnormality. Adrenals/Urinary Tract: Adrenal glands are unremarkable. The kidneys enhance symmetrically. No renal calculus or hydronephrosis. Subcentimeter hypodensities are present in the kidneys which are too small to further characterize. Bladder is unremarkable. Stomach/Bowel: There is a small hiatal hernia. No bowel obstruction, free air, or pneumatosis. A few scattered diverticula are noted along the colon without evidence of diverticulitis. Surgical changes are present at the cecum in the appendix is not visualized on exam. A mild-to-moderate amount of retained stool is present in the colon. No focal bowel wall thickening is identified. Vascular/Lymphatic: Aortic atherosclerosis. No enlarged abdominal or pelvic lymph nodes. Reproductive: Uterus and bilateral adnexa are unremarkable. Other: No abdominal wall hernia or abnormality. No abdominopelvic ascites. Musculoskeletal: No acute or suspicious osseous  abnormality. IMPRESSION: 1. No acute intra-abdominal process. 2. Mild moderate amount of residual stool in the colon. 3. Diverticulosis without diverticulitis. 4. Small hiatal hernia. 5. Aortic atherosclerosis. Electronically Signed   By: Thornell SartoriusLaura  Taylor M.D.   On: 01/13/2022 03:19  ? ?DG Abd 2 Views ? ?Result Date: 01/12/2022 ?CLINICAL DATA:  Constipation. EXAM: ABDOMEN - 2 VIEW COMPARISON:  May 21, 2004 FINDINGS: The bowel gas pattern is normal. A mild stool burden is seen. There is no evidence of free air. Radiopaque surgical clips are seen within the right upper quadrant. Surgical sutures noted within the lateral aspect of the mid to lower right abdomen. No radio-opaque calculi or other significant radiographic abnormality is seen. IMPRESSION: Mild stool burden without evidence of bowel obstruction. Electronically Signed   By: Aram Candelahaddeus  Houston M.D.   On: 01/12/2022 23:13   ? ?Procedures ?Procedures  ? ? ?Medications Ordered in ED ?Medications  ?fentaNYL (SUBLIMAZE) injection 25 mcg (has no administration in time range)  ?iohexol (OMNIPAQUE) 300 MG/ML solution 80 mL (80 mLs Intravenous Contrast Given 01/13/22 0259)  ? ? ?ED Course/ Medical Decision Making/ A&P ?Clinical Course as of 01/13/22 0354  ?Thu Jan 13, 2022  ?0350 Patient improved.  No acute distress.  CT imaging reveals only mild constipation.  No obstruction or other acute process. [DW]  ?0350 No signs of significant GI bleed, hemoglobin stable.  Patient be discharged home.  We will give short course of Colace we discussed appropriate diet  choices [DW]  ?  ?Clinical Course User Index ?[DW] Zadie Rhine, MD  ? ?                        ?Medical Decision Making ?Amount and/or Complexity of Data Reviewed ?Labs: ordered. ?Radiology: ordered. ? ?Risk ?OTC drugs. ?Prescription drug management. ? ? ?This patient presents to the ED for concern of constipation, this involves an extensive number of treatment options, and is a complaint that carries with it a  high risk of complications and morbidity.  The differential diagnosis includes bowel obstruction, ileus, bowel perforation ? ?Comorbidities that complicate the patient evaluation: ?Patient?s presentation is complicated by their hi

## 2022-01-27 DIAGNOSIS — Z09 Encounter for follow-up examination after completed treatment for conditions other than malignant neoplasm: Secondary | ICD-10-CM | POA: Diagnosis not present

## 2022-01-27 DIAGNOSIS — R031 Nonspecific low blood-pressure reading: Secondary | ICD-10-CM | POA: Diagnosis not present

## 2022-01-27 DIAGNOSIS — K5901 Slow transit constipation: Secondary | ICD-10-CM | POA: Diagnosis not present

## 2022-04-21 DIAGNOSIS — I10 Essential (primary) hypertension: Secondary | ICD-10-CM | POA: Diagnosis not present

## 2022-04-21 DIAGNOSIS — E7801 Familial hypercholesterolemia: Secondary | ICD-10-CM | POA: Diagnosis not present

## 2022-04-28 ENCOUNTER — Other Ambulatory Visit: Payer: Self-pay | Admitting: Internal Medicine

## 2022-04-28 DIAGNOSIS — I10 Essential (primary) hypertension: Secondary | ICD-10-CM | POA: Diagnosis not present

## 2022-04-28 DIAGNOSIS — N1831 Chronic kidney disease, stage 3a: Secondary | ICD-10-CM | POA: Diagnosis not present

## 2022-04-28 DIAGNOSIS — Z23 Encounter for immunization: Secondary | ICD-10-CM | POA: Diagnosis not present

## 2022-04-28 DIAGNOSIS — I70209 Unspecified atherosclerosis of native arteries of extremities, unspecified extremity: Secondary | ICD-10-CM | POA: Diagnosis not present

## 2022-05-11 DIAGNOSIS — S41112A Laceration without foreign body of left upper arm, initial encounter: Secondary | ICD-10-CM | POA: Diagnosis not present

## 2022-05-18 DIAGNOSIS — M19041 Primary osteoarthritis, right hand: Secondary | ICD-10-CM | POA: Diagnosis not present

## 2022-05-18 DIAGNOSIS — M19042 Primary osteoarthritis, left hand: Secondary | ICD-10-CM | POA: Diagnosis not present

## 2022-05-18 DIAGNOSIS — S41111D Laceration without foreign body of right upper arm, subsequent encounter: Secondary | ICD-10-CM | POA: Diagnosis not present

## 2022-05-23 ENCOUNTER — Other Ambulatory Visit: Payer: Self-pay

## 2022-05-23 DIAGNOSIS — I10 Essential (primary) hypertension: Secondary | ICD-10-CM | POA: Insufficient documentation

## 2022-05-23 NOTE — Patient Instructions (Signed)
Patient Goals/Self-Care Activities: Take all medications as prescribed Attend all scheduled provider appointments call doctor for signs and symptoms of high blood pressure

## 2022-05-23 NOTE — Patient Outreach (Signed)
[facility]) Care Management  05/23/2022  Danielle Proctor 1929/09/05 [id number]   Telephone call to patient for nurse call. Patient doing well. Discussed [facility] services.  Patient agreeable to nurse calls.  No concerns.   Care Plan : RN CM Plan of Care  Updates made by Jon Billings, RN since 05/23/2022 12:00 AM     Problem: Chronic Disease Management and Care Coordination Needs HTN   Priority: High     Long-Range Goal: Development of Plan of Care for Management of HTN   Start Date: 05/23/2022  Expected End Date: 10/16/2022  Priority: High  Note:   Current Barriers:  Chronic Disease Management support and education needs related to HTN   RNCM Clinical Goal(s):  Patient will verbalize basic understanding of  HTN disease process and self health management plan as evidenced by blood pressure reports less than 140/80  through collaboration with RN Care manager, provider, and care team.   Interventions: Education and support related to HTN Inter-disciplinary care team collaboration (see longitudinal plan of care) Evaluation of current treatment plan related to  self management and patient's adherence to plan as established by provider   Hypertension Interventions:  (Status:  New goal.) Long Term Goal Last practice recorded BP readings:  BP Readings from Last 3 Encounters:  01/13/22 (!) 149/69  07/29/21 (!) 182/77  08/25/18 (!) 159/78  Most recent eGFR/CrCl: No results found for: "EGFR"  No components found for: "CRCL"  Evaluation of current treatment plan related to hypertension self management and patient's adherence to plan as established by provider Reviewed medications with patient and discussed importance of compliance  Patient Goals/Self-Care Activities: Take all medications as prescribed Attend all scheduled provider appointments call doctor for signs and symptoms of high blood pressure  Follow Up Plan:  Telephone follow up appointment with care  management team member scheduled for:  November The patient has been provided with contact information for the care management team and has been advised to call with any health related questions or concerns.      Plan: RN CM will provide ongoing education and support to patient through phone calls.   RN CM will send welcome packet with consent to patient.   RN CM will send initial barriers letter, assessment, and care plan to primary care physician.   RN CM will contact patient in November and patient agrees to next contact and Care plan.  Jone Baseman, RN, MSN [facility] Care Management Care Management Coordinator Direct Line [phone] Toll Free: [phone]  Fax: [phone]

## 2022-05-31 DIAGNOSIS — R634 Abnormal weight loss: Secondary | ICD-10-CM | POA: Diagnosis not present

## 2022-05-31 DIAGNOSIS — M2540 Effusion, unspecified joint: Secondary | ICD-10-CM | POA: Diagnosis not present

## 2022-05-31 DIAGNOSIS — R42 Dizziness and giddiness: Secondary | ICD-10-CM | POA: Diagnosis not present

## 2022-06-01 ENCOUNTER — Other Ambulatory Visit: Payer: Self-pay

## 2022-06-01 ENCOUNTER — Emergency Department (HOSPITAL_COMMUNITY): Payer: Medicare HMO

## 2022-06-01 ENCOUNTER — Emergency Department (HOSPITAL_COMMUNITY)
Admission: EM | Admit: 2022-06-01 | Discharge: 2022-06-01 | Disposition: A | Discharge status: Home / Self Care | Payer: Medicare HMO | Attending: Emergency Medicine | Admitting: Emergency Medicine

## 2022-06-01 DIAGNOSIS — I10 Essential (primary) hypertension: Secondary | ICD-10-CM | POA: Insufficient documentation

## 2022-06-01 DIAGNOSIS — Z7982 Long term (current) use of aspirin: Secondary | ICD-10-CM | POA: Diagnosis not present

## 2022-06-01 DIAGNOSIS — R410 Disorientation, unspecified: Secondary | ICD-10-CM | POA: Diagnosis not present

## 2022-06-01 DIAGNOSIS — Z79899 Other long term (current) drug therapy: Secondary | ICD-10-CM | POA: Insufficient documentation

## 2022-06-01 DIAGNOSIS — R350 Frequency of micturition: Secondary | ICD-10-CM | POA: Diagnosis not present

## 2022-06-01 DIAGNOSIS — R42 Dizziness and giddiness: Secondary | ICD-10-CM | POA: Diagnosis not present

## 2022-06-01 LAB — BASIC METABOLIC PANEL WITH GFR
Anion gap: 12 (ref 5–15)
BUN: 19 mg/dL (ref 8–23)
CO2: 23 mmol/L (ref 22–32)
Calcium: 9.6 mg/dL (ref 8.9–10.3)
Chloride: 102 mmol/L (ref 98–111)
Creatinine, Ser: 1.02 mg/dL — ABNORMAL HIGH (ref 0.44–1.00)
GFR, Estimated: 52 mL/min — ABNORMAL LOW (ref >60)
Glucose, Bld: 112 mg/dL — ABNORMAL HIGH (ref 70–99)
Potassium: 3.9 mmol/L (ref 3.5–5.1)
Sodium: 137 mmol/L (ref 135–145)

## 2022-06-01 LAB — CBC WITH DIFFERENTIAL/PLATELET
Abs Immature Granulocytes: 0.02 K/uL (ref 0.00–0.07)
Basophils Absolute: 0.1 K/uL (ref 0.0–0.1)
Basophils Relative: 1 %
Eosinophils Absolute: 0 K/uL (ref 0.0–0.5)
Eosinophils Relative: 0 %
HCT: 38.6 % (ref 36.0–46.0)
Hemoglobin: 13.1 g/dL (ref 12.0–15.0)
Immature Granulocytes: 0 %
Lymphocytes Relative: 27 %
Lymphs Abs: 2.2 K/uL (ref 0.7–4.0)
MCH: 31 pg (ref 26.0–34.0)
MCHC: 33.9 g/dL (ref 30.0–36.0)
MCV: 91.5 fL (ref 80.0–100.0)
Monocytes Absolute: 0.6 K/uL (ref 0.1–1.0)
Monocytes Relative: 8 %
Neutro Abs: 5.3 K/uL (ref 1.7–7.7)
Neutrophils Relative %: 64 %
Platelets: 288 K/uL (ref 150–400)
RBC: 4.22 MIL/uL (ref 3.87–5.11)
RDW: 12.1 % (ref 11.5–15.5)
WBC: 8.3 K/uL (ref 4.0–10.5)
nRBC: 0 % (ref 0.0–0.2)

## 2022-06-01 LAB — URINALYSIS, ROUTINE W REFLEX MICROSCOPIC
Bacteria, UA: NONE SEEN
Bilirubin Urine: NEGATIVE
Glucose, UA: NEGATIVE mg/dL
Hgb urine dipstick: NEGATIVE
Ketones, ur: NEGATIVE mg/dL
Nitrite: NEGATIVE
Protein, ur: NEGATIVE mg/dL
Specific Gravity, Urine: 1.004 — ABNORMAL LOW (ref 1.005–1.030)
pH: 7 (ref 5.0–8.0)

## 2022-06-01 LAB — TROPONIN I (HIGH SENSITIVITY): Troponin I (High Sensitivity): 4 ng/L (ref <18)

## 2022-06-01 NOTE — ED Provider Notes (Signed)
  [name] EMERGENCY DEPARTMENT Provider Note   CSN: [id number] Arrival date & time: 06/01/22  1938     History {Add pertinent medical, surgical, social history, OB history to HPI:1} Chief Complaint  Patient presents with   Dizziness   Hypertension    Danielle Proctor is a 88 y.o. female.   Dizziness Hypertension       Home Medications Prior to Admission medications   Medication Sig Start Date End Date Taking? Authorizing Provider  aspirin EC 81 MG tablet Take 81 mg by mouth daily.    [provider]  atorvastatin (LIPITOR) 20 MG tablet Take 20 mg by mouth every evening. Patient not taking: Reported on 05/23/2022    [provider]  cholecalciferol (VITAMIN D) 1000 UNITS tablet Take 2,000 Units by mouth daily.    [provider]  docusate sodium (COLACE) 50 MG capsule Take 1 capsule (50 mg total) by mouth 2 (two) times daily. Patient not taking: Reported on 05/23/2022 01/13/22   Zadie Rhine, MD  hydrocortisone cream 1 % Apply 1 application topically daily as needed for itching.    [provider]  levothyroxine (SYNTHROID, LEVOTHROID) 88 MCG tablet Take 88 mcg by mouth daily before breakfast.    [provider]  lisinopril (PRINIVIL,ZESTRIL) 20 MG tablet Take 20 mg by mouth every evening.  04/28/14   [provider]  LORazepam (ATIVAN) 0.5 MG tablet Take 0.5 tablets (0.25 mg total) by mouth every 8 (eight) hours as needed for anxiety. 07/29/21   Dione Booze, MD  simethicone (MYLICON) 80 MG chewable tablet Chew 80 mg by mouth every 6 (six) hours as needed for flatulence.    [provider]      Allergies    Sulfa antibiotics    Review of Systems   Review of Systems  Neurological:  Positive for dizziness.    Physical Exam Updated Vital Signs BP (!) 176/66 (BP Location: Right Arm)   Pulse 81   Temp 98.1 F (36.7 C) (Oral)   Ht 5' (1.524 m)   Wt 57.6 kg   SpO2 100%   BMI 24.80  kg/m  Physical Exam  ED Results / Procedures / Treatments   Labs (all labs ordered are listed, but only abnormal results are displayed) Labs Reviewed - No data to display  EKG None  Radiology No results found.  Procedures Procedures  {Document cardiac monitor, telemetry assessment procedure when appropriate:1}  Medications Ordered in ED Medications - No data to display  ED Course/ Medical Decision Making/ A&P                           Medical Decision Making  ***  {Document critical care time when appropriate:1} {Document review of labs and clinical decision tools ie heart score, Chads2Vasc2 etc:1}  {Document your independent review of radiology images, and any outside records:1} {Document your discussion with family members, caretakers, and with consultants:1} {Document social determinants of health affecting pt's care:1} {Document your decision making why or why not admission, treatments were needed:1} Final Clinical Impression(s) / ED Diagnoses Final diagnoses:  None    Rx / DC Orders ED Discharge Orders     None

## 2022-06-01 NOTE — ED Notes (Signed)
ED Provider at bedside. 

## 2022-06-01 NOTE — ED Triage Notes (Addendum)
BIB EMS for ongoing dizziness x 4 days. Was seen at PCP for a check up Tuesday and they disregarded her complaint due to all her blood work being normal. Pt went to a pharmacy today for continued symptom and they saw she had a high blood pressure. Currently takes lisinopril.   EMS initial bp: 210/100

## 2022-06-01 NOTE — Discharge Instructions (Signed)
Urinalysis was unconvincing for urinary tract infection.  Your blood pressure is hypertensive without evidence of endorgan damage here in the emergency department.  You have a normal neurologic exam.  Recommend you follow-up with your primary care provider outpatient.

## 2022-06-02 ENCOUNTER — Other Ambulatory Visit: Payer: Medicare HMO

## 2022-06-02 DIAGNOSIS — I10 Essential (primary) hypertension: Secondary | ICD-10-CM | POA: Diagnosis not present

## 2022-06-02 DIAGNOSIS — I951 Orthostatic hypotension: Secondary | ICD-10-CM | POA: Diagnosis not present

## 2022-06-02 DIAGNOSIS — M2540 Effusion, unspecified joint: Secondary | ICD-10-CM | POA: Diagnosis not present

## 2022-06-02 DIAGNOSIS — R42 Dizziness and giddiness: Secondary | ICD-10-CM | POA: Diagnosis not present

## 2022-06-07 DIAGNOSIS — R2689 Other abnormalities of gait and mobility: Secondary | ICD-10-CM | POA: Diagnosis not present

## 2022-06-07 DIAGNOSIS — I10 Essential (primary) hypertension: Secondary | ICD-10-CM | POA: Diagnosis not present

## 2022-06-09 ENCOUNTER — Other Ambulatory Visit: Payer: Medicare HMO

## 2022-06-16 ENCOUNTER — Other Ambulatory Visit: Payer: Self-pay

## 2022-06-16 DIAGNOSIS — M79641 Pain in right hand: Secondary | ICD-10-CM | POA: Diagnosis not present

## 2022-06-16 DIAGNOSIS — M79642 Pain in left hand: Secondary | ICD-10-CM | POA: Diagnosis not present

## 2022-06-16 DIAGNOSIS — M19049 Primary osteoarthritis, unspecified hand: Secondary | ICD-10-CM | POA: Diagnosis not present

## 2022-06-16 NOTE — Patient Outreach (Signed)
[facility]) Care Management  06/16/2022  NARMEEN KERPER 1929-01-22 [id number]   Patient request removal from program.   RN CM will close case.   Bary Leriche, RN, MSN [facility] Care Management Care Management Coordinator Direct Line [phone] Toll Free: [phone]  Fax: [phone]

## 2022-06-17 ENCOUNTER — Ambulatory Visit (HOSPITAL_COMMUNITY)
Admission: EM | Admit: 2022-06-17 | Discharge: 2022-06-17 | Disposition: A | Discharge status: Home / Self Care | Payer: Medicare HMO | Attending: Internal Medicine | Admitting: Internal Medicine

## 2022-06-17 ENCOUNTER — Encounter (HOSPITAL_COMMUNITY): Payer: Self-pay

## 2022-06-17 DIAGNOSIS — M25441 Effusion, right hand: Secondary | ICD-10-CM | POA: Diagnosis not present

## 2022-06-17 MED ORDER — FAMOTIDINE 20 MG PO TABS: ORAL_TABLET | ORAL | 0 refills | Status: DC

## 2022-06-17 MED ORDER — PREDNISONE 10 MG PO TABS: 10.0000 mg | ORAL_TABLET | Freq: Every day | ORAL | 0 refills | Status: AC

## 2022-06-17 NOTE — ED Provider Notes (Signed)
[facility]    CSN: [id number] Arrival date & time: 06/17/22  1431      History   Chief Complaint Chief Complaint  Patient presents with   Joint Pain   Hypertension    HPI Danielle Proctor is a 88 y.o. female.   Patient presents to urgent care for evaluation of joint swelling to the ring finger of the right hand at the PIP joint that started approximately 1 week ago.  Patient was seen by her PCP for joint swelling 3 days ago and instructed to apply Voltaren gel to the joint to reduce swelling.  She was also referred to a rheumatologist and states that she has an appointment with the rheumatologist on Tuesday, June 21, 2022.  Her pain and swelling has worsened over the last few days and has not responded well to ibuprofen use over-the-counter.  She is also complaining of elevated blood pressure and states she believes her blood pressure is elevated due to her significant discomfort to her right hand.  Denies any other areas of swelling or tenderness to the body.  Denies recent falls, trauma, or injury to the area.  She states that the joint feels warm, swollen, and tender to touch.  She has continued using Aspercreme topically to the right ring finger with some relief of symptoms.  Denies fever/chills, shortness of breath, chest pain, headache, and dizziness.     Past Medical History:  Diagnosis Date   Hypertension    Thyroid disease     Patient Active Problem List   Diagnosis Date Noted   Hypertension 05/23/2022    Past Surgical History:  Procedure Laterality Date   APPENDECTOMY     CHOLECYSTECTOMY     COLON SURGERY      OB History   No obstetric history on file.      Home Medications    Prior to Admission medications   Medication Sig Start Date End Date Taking? Authorizing Provider  famotidine (PEPCID) 20 MG tablet Take 1 20mg  tablet daily while taking prednisone. 06/17/22  Yes 08/17/22, FNP  predniSONE (DELTASONE) 10 MG tablet  Take 1 tablet (10 mg total) by mouth daily for 5 days. 06/17/22 06/22/22 Yes 08/22/22, FNP  aspirin EC 81 MG tablet Take 81 mg by mouth daily.    [provider]  atorvastatin (LIPITOR) 20 MG tablet Take 20 mg by mouth every evening. Patient not taking: Reported on 05/23/2022    [provider]  cholecalciferol (VITAMIN D) 1000 UNITS tablet Take 2,000 Units by mouth daily.    [provider]  docusate sodium (COLACE) 50 MG capsule Take 1 capsule (50 mg total) by mouth 2 (two) times daily. Patient not taking: Reported on 05/23/2022 01/13/22   01/15/22, MD  hydrocortisone cream 1 % Apply 1 application topically daily as needed for itching.    [provider]  levothyroxine (SYNTHROID, LEVOTHROID) 88 MCG tablet Take 88 mcg by mouth daily before breakfast.    [provider]  lisinopril (PRINIVIL,ZESTRIL) 20 MG tablet Take 20 mg by mouth every evening.  04/28/14   [provider]  LORazepam (ATIVAN) 0.5 MG tablet Take 0.5 tablets (0.25 mg total) by mouth every 8 (eight) hours as needed for anxiety. 07/29/21   07/31/21, MD  simethicone (MYLICON) 80 MG chewable tablet Chew 80 mg by mouth every 6 (six) hours as needed for flatulence.    [provider]    Family History No  family history on file.  Social History Social History   Tobacco Use   Smoking status: Never   Smokeless tobacco: Never  Substance Use Topics   Alcohol use: Yes    Comment: Socially    Drug use: No     Allergies   Sulfa antibiotics   Review of Systems Review of Systems Per HPI  Physical Exam Triage Vital Signs ED Triage Vitals [06/17/22 1459]  Enc Vitals Group     BP (!) 169/73     Pulse Rate 90     Resp 18     Temp 98.4 F (36.9 C)     Temp Source Oral     SpO2 98 %     Weight      Height      Head Circumference      Peak Flow      Pain Score      Pain Loc      Pain Edu?      Excl. in GC?    No data found.  Updated  Vital Signs BP (!) 169/73 (BP Location: Left Arm)   Pulse 90   Temp 98.4 F (36.9 C) (Oral)   Resp 18   SpO2 98%   Visual Acuity Right Eye Distance:   Left Eye Distance:   Bilateral Distance:    Right Eye Near:   Left Eye Near:    Bilateral Near:     Physical Exam Vitals and nursing note reviewed.  Constitutional:      Appearance: Normal appearance. She is not ill-appearing or toxic-appearing.     Comments: Very pleasant patient sitting on exam in position of comfort table in no acute distress.   HENT:     Head: Normocephalic and atraumatic.     Right Ear: Hearing and external ear normal.     Left Ear: Hearing and external ear normal.     Nose: Nose normal.     Mouth/Throat:     Lips: Pink.     Mouth: Mucous membranes are moist.  Eyes:     General: Lids are normal. Vision grossly intact. Gaze aligned appropriately.     Extraocular Movements: Extraocular movements intact.     Conjunctiva/sclera: Conjunctivae normal.  Pulmonary:     Effort: Pulmonary effort is normal.  Abdominal:     Palpations: Abdomen is soft.  Musculoskeletal:     Cervical back: Neck supple.     Comments: Moderate swelling erythema, and warmth present to the right ring finger PIP joint.  5/5 grip strength to bilateral upper extremities.  Range of motion to the right ring finger is slightly decreased due to swelling.  Capillary refill is less than 3.  +2 radial pulses bilaterally.  Sensation is intact.  See images below for further detail.  Skin:    General: Skin is warm and dry.     Capillary Refill: Capillary refill takes less than 2 seconds.     Findings: No rash.  Neurological:     General: No focal deficit present.     Mental Status: She is alert and oriented to person, place, and time. Mental status is at baseline.     Cranial Nerves: No dysarthria or facial asymmetry.     Gait: Gait is intact.  Psychiatric:        Mood and Affect: Mood normal.        Speech: Speech normal.        Behavior:  Behavior normal.  Thought Content: Thought content normal.        Judgment: Judgment normal.       UC Treatments / Results  Labs (all labs ordered are listed, but only abnormal results are displayed) Labs Reviewed - No data to display  EKG   Radiology No results found.  Procedures Procedures (including critical care time)  Medications Ordered in UC Medications - No data to display  Initial Impression / Assessment and Plan / UC Course  I have reviewed the triage vital signs and the nursing notes.  Pertinent labs & imaging results that were available during my care of the patient were reviewed by me and considered in my medical decision making (see chart for details).   1.  Swelling of finger joint of right hand Patient's symptoms are consistent with rheumatoid arthritis and will likely respond well to prednisone therapy once daily for the next 5 days.  Prednisone 10 mg burst prescribed to be started tomorrow.  Advised patient to take this medicine with food to avoid stomach upset.  She may continue using Aspercreme topically to the joint and has been advised to follow-up with rheumatology as scheduled on Tuesday (4 days from now).  Advised to return to urgent care as needed if symptoms do not improve in the next 24 to 48 hours with prednisone use.  Deferred imaging based on stable musculoskeletal exam and hemodynamically stable vital signs.  Discussed physical exam and available lab work findings in clinic with patient.  Counseled patient regarding appropriate use of medications and potential side effects for all medications recommended or prescribed today. Discussed red flag signs and symptoms of worsening condition,when to call the PCP office, return to urgent care, and when to seek higher level of care in the emergency department. Patient verbalizes understanding and agreement with plan. All questions answered. Patient discharged in stable condition.  Final Clinical  Impressions(s) / UC Diagnoses   Final diagnoses:  Swelling of finger joint of right hand     Discharge Instructions      Take prednisone 10 mg once daily in the morning with breakfast starting tomorrow morning.  Take this medicine with food to avoid stomach upset.  Continue applying Aspercreme to the joint.  Follow-up with rheumatology as scheduled on Tuesday.  If you develop any new or worsening symptoms or do not improve in the next 2 to 3 days, please return.  If your symptoms are severe, please go to the emergency room.  Follow-up with your primary care provider for further evaluation and management of your symptoms as well as ongoing wellness visits.  I hope you feel better!      ED Prescriptions     Medication Sig Dispense Auth. Provider   predniSONE (DELTASONE) 10 MG tablet Take 1 tablet (10 mg total) by mouth daily for 5 days. 5 tablet Carlisle Beers, FNP   famotidine (PEPCID) 20 MG tablet Take 1 20mg  tablet daily while taking prednisone. 30 tablet , FNP      PDMP not reviewed this encounter.   Carlisle Beers, Carlisle Beers 06/17/22 [phone]

## 2022-06-17 NOTE — Discharge Instructions (Addendum)
Take prednisone 10 mg once daily in the morning with breakfast starting tomorrow morning.  Take this medicine with food to avoid stomach upset.  Continue applying Aspercreme to the joint.  Follow-up with rheumatology as scheduled on Tuesday.  If you develop any new or worsening symptoms or do not improve in the next 2 to 3 days, please return.  If your symptoms are severe, please go to the emergency room.  Follow-up with your primary care provider for further evaluation and management of your symptoms as well as ongoing wellness visits.  I hope you feel better!

## 2022-06-17 NOTE — ED Triage Notes (Signed)
Pt reports right finger and joint pain for several weeks. Pt reports her blood pressure is high due to her finger hurting.    Pt reports her blood pressure medication from lisinopril to amlodipine 5 mg.

## 2022-06-21 DIAGNOSIS — M25641 Stiffness of right hand, not elsewhere classified: Secondary | ICD-10-CM | POA: Diagnosis not present

## 2022-06-21 DIAGNOSIS — M1990 Unspecified osteoarthritis, unspecified site: Secondary | ICD-10-CM | POA: Diagnosis not present

## 2022-06-21 DIAGNOSIS — M79641 Pain in right hand: Secondary | ICD-10-CM | POA: Diagnosis not present

## 2022-06-23 DIAGNOSIS — I441 Atrioventricular block, second degree: Secondary | ICD-10-CM | POA: Diagnosis not present

## 2022-06-23 DIAGNOSIS — Z92241 Personal history of systemic steroid therapy: Secondary | ICD-10-CM | POA: Diagnosis not present

## 2022-06-23 DIAGNOSIS — R55 Syncope and collapse: Secondary | ICD-10-CM | POA: Diagnosis not present

## 2022-06-24 ENCOUNTER — Ambulatory Visit: Payer: Medicare HMO | Admitting: Cardiology

## 2022-06-24 ENCOUNTER — Encounter: Payer: Self-pay | Admitting: Cardiology

## 2022-06-24 ENCOUNTER — Inpatient Hospital Stay: Payer: Medicare HMO

## 2022-06-24 ENCOUNTER — Telehealth: Payer: Self-pay

## 2022-06-24 VITALS — BP 145/62 | HR 88 | Temp 97.9°F | Resp 16 | Ht 60.0 in | Wt 112.0 lb

## 2022-06-24 DIAGNOSIS — R9431 Abnormal electrocardiogram [ECG] [EKG]: Secondary | ICD-10-CM

## 2022-06-24 DIAGNOSIS — R55 Syncope and collapse: Secondary | ICD-10-CM

## 2022-06-24 DIAGNOSIS — I10 Essential (primary) hypertension: Secondary | ICD-10-CM | POA: Diagnosis not present

## 2022-06-24 DIAGNOSIS — E039 Hypothyroidism, unspecified: Secondary | ICD-10-CM

## 2022-06-24 DIAGNOSIS — I700 Atherosclerosis of aorta: Secondary | ICD-10-CM | POA: Diagnosis not present

## 2022-06-24 DIAGNOSIS — E7800 Pure hypercholesterolemia, unspecified: Secondary | ICD-10-CM | POA: Diagnosis not present

## 2022-06-24 NOTE — Telephone Encounter (Signed)
NOTES SCANNED TO REFERRAL 

## 2022-06-24 NOTE — Progress Notes (Signed)
ID:  Danielle Proctor, DOB 06/09/1929, MRN [id number]  PCP:  Merri Brunette, MD  Cardiologist:  Tessa Lerner, DO, [facility] (established care 06/24/2022)  REASON FOR CONSULT: Near syncope.   REQUESTING PHYSICIAN:  Merri Brunette, MD [address] [place],  [place] [address]  Chief Complaint  Patient presents with   Abnormal ECG   Near Syncope   New Patient (Initial Visit)    HPI  Danielle Proctor is a 88 y.o. Caucasian female who presents to the clinic for evaluation of near syncope and abnormal EKG at the request of Merri Brunette, MD. Her past medical history and cardiovascular risk factors include: Hypertension, hypothyroidism, hypercholesterolemia, aortic atherosclerosis, diverticulosis, Barrett's esophagus.  Patient is accompanied by her son Danielle Proctor who is a Multimedia programmer in [place].  Patient has been having episodes of hazy vision which comes and goes intermittently for the last 1 to 2 months.  At times she feels faint but never has had a syncopal event.  Patient is son has also noted a wide pulse pressures in the recent past.  Due to the symptoms she visited her PCP and was noted to have an abnormal EKG and now referred to cardiology for further evaluation and management.  Patient has an appointment with her eye doctor this coming Thursday for further evaluation and management of her vision disturbance.  She denies any focal neurological deficits, no nausea or vomiting, no ataxia.  FUNCTIONAL STATUS: No structured exercise program or daily routine.  Lives independently at the age of 37.  ALLERGIES: Allergies  Allergen Reactions   Sulfa Antibiotics Nausea And Vomiting    MEDICATION LIST PRIOR TO VISIT: Current Meds  Medication Sig   aspirin EC 81 MG tablet Take 81 mg by mouth daily.   cholecalciferol (VITAMIN D) 1000 UNITS tablet Take 2,000 Units by mouth daily.   docusate sodium (COLACE) 50 MG capsule Take 1 capsule (50 mg total) by  mouth 2 (two) times daily. (Patient taking differently: Take 50 mg by mouth daily as needed.)   hydrocortisone cream 1 % Apply 1 application topically daily as needed for itching.   levothyroxine (SYNTHROID, LEVOTHROID) 88 MCG tablet Take 88 mcg by mouth daily before breakfast.   LORazepam (ATIVAN) 0.5 MG tablet Take 0.5 tablets (0.25 mg total) by mouth every 8 (eight) hours as needed for anxiety.   Melatonin 3 MG CAPS Take 1 Capful by mouth as needed.     PAST MEDICAL HISTORY: Past Medical History:  Diagnosis Date   Hypertension    Thyroid disease     PAST SURGICAL HISTORY: Past Surgical History:  Procedure Laterality Date   APPENDECTOMY     CHOLECYSTECTOMY     COLON SURGERY      FAMILY HISTORY: The patient family history includes Heart attack in her father and mother; Lung cancer in her brother. Dad died at of MI in his 40s.   SOCIAL HISTORY:  The patient  reports that she has never smoked. She has never used smokeless tobacco. She reports that she does not currently use alcohol. She reports that she does not use drugs. Lives alone and independently.   REVIEW OF SYSTEMS: Review of Systems  Eyes:  Positive for visual disturbance (bilateral).  Cardiovascular:  Positive for near-syncope. Negative for chest pain, claudication, dyspnea on exertion, irregular heartbeat, leg swelling, orthopnea, palpitations, paroxysmal nocturnal dyspnea and syncope.  Respiratory:  Negative for shortness of breath.   Hematologic/Lymphatic: Negative for bleeding problem.  Musculoskeletal:  Negative  for muscle cramps and myalgias.  Neurological:  Negative for dizziness and light-headedness.    PHYSICAL EXAM:    06/24/2022    9:40 AM 06/17/2022    2:59 PM 06/01/2022   10:00 PM  Vitals with BMI  Height 5\' 0"     Weight 112 lbs    BMI 21.87    Systolic 145 169  Diastolic 62 73 59  Pulse 88 90 76   Orthostatic VS for the past 72 hrs (Last 3 readings):  Orthostatic BP Patient Position BP  Location Cuff Size Orthostatic Pulse  06/24/22 0949 142/58 Standing Left Arm Normal 93  06/24/22 0948 149/69 Sitting Left Arm Normal 91  06/24/22 0946 151/67 Supine Left Arm Normal 93   CONSTITUTIONAL: Well-developed and well-nourished. No acute distress.  SKIN: Skin is warm and dry. No rash noted. No cyanosis. No pallor. No jaundice HEAD: Normocephalic and atraumatic.  EYES: No scleral icterus MOUTH/THROAT: Moist oral membranes.  NECK: No JVD present. No thyromegaly noted. No carotid bruits  LYMPHATIC: No visible cervical adenopathy.  CHEST Normal respiratory effort. No intercostal retractions  LUNGS: Clear to auscultation bilaterally.  No stridor. No wheezes. No rales.  CARDIOVASCULAR: Regular rate and rhythm, positive S1-S2, soft systolic murmur, no rubs or gallops appreciated. ABDOMINAL: Soft, nontender, nondistended, positive bowel sounds in all 4 quadrants, no apparent ascites.  EXTREMITIES: No peripheral edema, warm to touch, 2+ DP and PT pulses HEMATOLOGIC: No significant bruising NEUROLOGIC: Oriented to person, place, and time. Nonfocal. Normal muscle tone.  PSYCHIATRIC: Normal mood and affect. Normal behavior. Cooperative  CARDIAC DATABASE: EKG: 06/01/2022: Normal sinus rhythm, 68 bpm, normal axis, without underlying ischemia injury pattern.  06/24/2022:  NSR, 85bpm, normal axis, without underlying injury pattern.   Echocardiogram: No results found for this or any previous visit from the past 1095 days.    Stress Testing: No results found for this or any previous visit from the past 1095 days.   Heart Catheterization: None  LABORATORY DATA:    Latest Ref Rng & Units 06/01/2022    7:50 PM 01/12/2022   10:16 PM 06/06/2016   12:28 PM  CBC  WBC 4.0 - 10.5 K/uL 8.3  8.1  9.1   Hemoglobin 12.0 - 15.0 g/dL 06/08/2016  63.8  93.7   Hematocrit 36.0 - 46.0 % 38.6  41.7  43.7   Platelets 150 - 400 K/uL 288  304  321        Latest Ref Rng & Units 06/01/2022    7:50 PM  01/12/2022   10:16 PM 06/06/2016   12:28 PM  CMP  Glucose 70 - 99 mg/dL 06/08/2016  876  811   BUN 8 - 23 mg/dL 19  13  17    Creatinine 0.44 - 1.00 mg/dL 572   6.20   Sodium 135 - 145 mmol/L 137  138  136   Potassium 3.5 - 5.1 mmol/L 3.9  3.7  4.1   Chloride 98 - 111 mmol/L 102  103  101   CO2 22 - 32 mmol/L 23  25  24    Calcium 8.9 - 10.3 mg/dL 9.6  9.8  3.55   Total Protein 6.5 - 8.1 g/dL  7.1    Total Bilirubin 0.3 - 1.2 mg/dL  0.8    Alkaline Phos 38 - 126 U/L  19    AST 15 - 41 U/L  22    ALT 0 - 44 U/L  15      Lipid Panel  No  results found for: "CHOL", "TRIG", "HDL", "CHOLHDL", "VLDL", "LDLCALC", "LDLDIRECT", "LABVLDL"  No components found for: "NTPROBNP" No results for input(s): "PROBNP" in the last 8760 hours. No results for input(s): "TSH" in the last 8760 hours.  BMP Recent Labs    01/12/22 2216 06/01/22 1950  NA 138 137  K 3.7 3.9  CL 103 102  CO2 25 23  GLUCOSE 116* 112*  BUN 13 19  CREATININE 1.10* 1.02*  CALCIUM 9.8 9.6  GFRNONAA 48* 52*    HEMOGLOBIN A1C No results found for: "HGBA1C", "MPG"  IMPRESSION:    ICD-10-CM   1. Abnormal EKG  R94.31 EKG 12-Lead    PCV ECHOCARDIOGRAM COMPLETE    LONG TERM MONITOR (3-14 DAYS)    2. Near syncope  R55 LONG TERM MONITOR (3-14 DAYS)    3. Benign hypertension  I10     4. Hypothyroidism, unspecified type  E03.9     5. Hypercholesterolemia  E78.00     6. Atherosclerosis of aorta (HCC)  I70.0        RECOMMENDATIONS: GERALDINE SANDBERG is a 88 y.o. Caucasian female whose past medical history and cardiac risk factors include: Hypertension, hypothyroidism, hypercholesterolemia, aortic atherosclerosis, diverticulosis, Barrett's esophagus.  Abnormal EKG The EKG was performed at her PCPs office is provided today for independent review. EKG read interpretation notes sinus rhythm with second-degree AV block.  However, my independent interpretation of her EKG notes no atrial ventricular block.  This is reviewed  in detail with her son who is also physician and he is in agreement as well. EKG today shows sinus rhythm without underlying ischemia or injury pattern. Given her symptoms of near syncope we will still proceed with a Holter monitor to evaluate for dysrhythmias. Echo will be ordered to evaluate for structural heart disease and left ventricular systolic function.  Near syncope Cardiac monitor as discussed above Educated on importance of keeping herself well-hydrated, 3 balanced heart healthy meals per day. If she has any focal neurological deficits, intractable nausea or vomiting, or ataxia she is asked to go to the closest ER via EMS for further evaluation and management for possible stroke. Echo will be ordered to evaluate for structural heart disease and left ventricular systolic function.  Benign hypertension Office blood pressures are within acceptable limits.  However, would still recommend better blood pressure management. Recently started on amlodipine.  But in the past was on lisinopril 20 mg p.o. daily. Given her episodes of near syncope would avoid vasodilator therapy such as amlodipine for now. Would recommend a low-dose of lisinopril at 10 mg p.o. daily.  We will defer antihypertensive medications to PCP at the request of the family.  Hypercholesterolemia / Atherosclerosis of aorta [facility]) Patient carries a history of hypercholesterolemia but is not on statin therapy. Imaging studies have also noted atherosclerosis. Discussed importance of statin therapy. However patient and her son verbalized that in the past but shared decision was to hold off on statin therapy due to myalgias. Recommended nonstatin options such as Zetia, red yeast rice, Nexletol. We will discuss further with PCP.  Given her vision disturbances recommend ophthalmology and neurology evaluation as well.  FINAL MEDICATION LIST END OF ENCOUNTER: No orders of the defined types were placed in this encounter.    Medications Discontinued During This Encounter  Medication Reason   atorvastatin (LIPITOR) 20 MG tablet    famotidine (PEPCID) 20 MG tablet    lisinopril (PRINIVIL,ZESTRIL) 20 MG tablet    simethicone (MYLICON) 80 MG chewable tablet  Current Outpatient Medications:    aspirin EC 81 MG tablet, Take 81 mg by mouth daily., Disp: , Rfl:    cholecalciferol (VITAMIN D) 1000 UNITS tablet, Take 2,000 Units by mouth daily., Disp: , Rfl:    docusate sodium (COLACE) 50 MG capsule, Take 1 capsule (50 mg total) by mouth 2 (two) times daily. (Patient taking differently: Take 50 mg by mouth daily as needed.), Disp: 14 capsule, Rfl: 0   hydrocortisone cream 1 %, Apply 1 application topically daily as needed for itching., Disp: , Rfl:    levothyroxine (SYNTHROID, LEVOTHROID) 88 MCG tablet, Take 88 mcg by mouth daily before breakfast., Disp: , Rfl:    LORazepam (ATIVAN) 0.5 MG tablet, Take 0.5 tablets (0.25 mg total) by mouth every 8 (eight) hours as needed for anxiety., Disp: 10 tablet, Rfl: 0   Melatonin 3 MG CAPS, Take 1 Capful by mouth as needed., Disp: , Rfl:   Orders Placed This Encounter  Procedures   LONG TERM MONITOR (3-14 DAYS)   EKG 12-Lead   PCV ECHOCARDIOGRAM COMPLETE    There are no Patient Instructions on file for this visit.   --Continue cardiac medications as reconciled in final medication list. --Return in about 6 weeks (around 08/05/2022) for Follow up near syncope. . or sooner if needed. --Continue follow-up with your primary care physician regarding the management of your other chronic comorbid conditions.  Patient's questions and concerns were addressed to her satisfaction. She voices understanding of the instructions provided during this encounter.   This note was created using a voice recognition software as a result there may be grammatical errors inadvertently enclosed that do not reflect the nature of this encounter. Every attempt is made to correct such  errors.  Tessa Lerner, [place], [facility]  Pager: [phone] Office: [phone]

## 2022-06-29 DIAGNOSIS — H524 Presbyopia: Secondary | ICD-10-CM | POA: Diagnosis not present

## 2022-06-29 DIAGNOSIS — H5203 Hypermetropia, bilateral: Secondary | ICD-10-CM | POA: Diagnosis not present

## 2022-06-29 DIAGNOSIS — H25813 Combined forms of age-related cataract, bilateral: Secondary | ICD-10-CM | POA: Diagnosis not present

## 2022-06-29 DIAGNOSIS — H52223 Regular astigmatism, bilateral: Secondary | ICD-10-CM | POA: Diagnosis not present

## 2022-06-30 ENCOUNTER — Other Ambulatory Visit: Payer: Medicare HMO

## 2022-06-30 DIAGNOSIS — R55 Syncope and collapse: Secondary | ICD-10-CM | POA: Diagnosis not present

## 2022-07-11 DIAGNOSIS — R269 Unspecified abnormalities of gait and mobility: Secondary | ICD-10-CM | POA: Diagnosis not present

## 2022-07-11 DIAGNOSIS — H8113 Benign paroxysmal vertigo, bilateral: Secondary | ICD-10-CM | POA: Diagnosis not present

## 2022-07-13 DIAGNOSIS — H8113 Benign paroxysmal vertigo, bilateral: Secondary | ICD-10-CM | POA: Diagnosis not present

## 2022-07-13 DIAGNOSIS — R269 Unspecified abnormalities of gait and mobility: Secondary | ICD-10-CM | POA: Diagnosis not present

## 2022-07-16 DIAGNOSIS — R55 Syncope and collapse: Secondary | ICD-10-CM | POA: Diagnosis not present

## 2022-07-16 DIAGNOSIS — R9431 Abnormal electrocardiogram [ECG] [EKG]: Secondary | ICD-10-CM | POA: Diagnosis not present

## 2022-07-18 DIAGNOSIS — R269 Unspecified abnormalities of gait and mobility: Secondary | ICD-10-CM | POA: Diagnosis not present

## 2022-07-18 DIAGNOSIS — H8113 Benign paroxysmal vertigo, bilateral: Secondary | ICD-10-CM | POA: Diagnosis not present

## 2022-07-21 DIAGNOSIS — I10 Essential (primary) hypertension: Secondary | ICD-10-CM | POA: Diagnosis not present

## 2022-07-21 DIAGNOSIS — Z23 Encounter for immunization: Secondary | ICD-10-CM | POA: Diagnosis not present

## 2022-07-22 ENCOUNTER — Other Ambulatory Visit: Payer: Medicare HMO

## 2022-07-25 DIAGNOSIS — H8113 Benign paroxysmal vertigo, bilateral: Secondary | ICD-10-CM | POA: Diagnosis not present

## 2022-07-25 DIAGNOSIS — R269 Unspecified abnormalities of gait and mobility: Secondary | ICD-10-CM | POA: Diagnosis not present

## 2022-08-04 ENCOUNTER — Telehealth: Payer: Self-pay

## 2022-08-05 ENCOUNTER — Ambulatory Visit: Payer: Medicare HMO | Admitting: Cardiology

## 2022-08-05 NOTE — Telephone Encounter (Signed)
Please call her son Hassell Done who is a pediatric oncologist to make sure they are all in agreement for not following up.  Suzzette Gasparro [place], DO, [facility]

## 2022-08-11 NOTE — Telephone Encounter (Signed)
Called and spoke to patient's son he said he thanks you for checking up on her he stated his mother's symptoms have resolved and believes for right now she isn't going to get the echo done he did asked about her monitor if the report is back please advise

## 2022-08-12 DIAGNOSIS — R55 Syncope and collapse: Secondary | ICD-10-CM | POA: Diagnosis not present

## 2022-08-12 DIAGNOSIS — R9431 Abnormal electrocardiogram [ECG] [EKG]: Secondary | ICD-10-CM | POA: Diagnosis not present

## 2022-08-12 NOTE — Telephone Encounter (Signed)
Average heart rate 75 bpm No atrial fibrillation during the monitoring period. No significant dysrhythmias. Please refer to the result note.  Deagan Sevin [place], DO, [facility]

## 2022-08-17 NOTE — Progress Notes (Signed)
Called and spoke with patient regarding her cardiac monitor results.

## 2022-08-17 NOTE — Telephone Encounter (Signed)
Called and spoke to patient's son Hassell Done he voiced understanding

## 2022-10-20 DIAGNOSIS — Z0000 Encounter for general adult medical examination without abnormal findings: Secondary | ICD-10-CM | POA: Diagnosis not present

## 2022-10-20 DIAGNOSIS — E039 Hypothyroidism, unspecified: Secondary | ICD-10-CM | POA: Diagnosis not present

## 2022-10-20 DIAGNOSIS — E559 Vitamin D deficiency, unspecified: Secondary | ICD-10-CM | POA: Diagnosis not present

## 2022-10-20 DIAGNOSIS — E7800 Pure hypercholesterolemia, unspecified: Secondary | ICD-10-CM | POA: Diagnosis not present

## 2022-10-20 DIAGNOSIS — R7303 Prediabetes: Secondary | ICD-10-CM | POA: Diagnosis not present

## 2022-10-28 DIAGNOSIS — R7303 Prediabetes: Secondary | ICD-10-CM | POA: Diagnosis not present

## 2022-10-28 DIAGNOSIS — E7800 Pure hypercholesterolemia, unspecified: Secondary | ICD-10-CM | POA: Diagnosis not present

## 2022-10-28 DIAGNOSIS — G720 Drug-induced myopathy: Secondary | ICD-10-CM | POA: Diagnosis not present

## 2022-10-28 DIAGNOSIS — E559 Vitamin D deficiency, unspecified: Secondary | ICD-10-CM | POA: Diagnosis not present

## 2022-10-28 DIAGNOSIS — T466X5A Adverse effect of antihyperlipidemic and antiarteriosclerotic drugs, initial encounter: Secondary | ICD-10-CM | POA: Diagnosis not present

## 2022-10-28 DIAGNOSIS — I700 Atherosclerosis of aorta: Secondary | ICD-10-CM | POA: Diagnosis not present

## 2022-10-28 DIAGNOSIS — E039 Hypothyroidism, unspecified: Secondary | ICD-10-CM | POA: Diagnosis not present

## 2022-10-28 DIAGNOSIS — Z0000 Encounter for general adult medical examination without abnormal findings: Secondary | ICD-10-CM | POA: Diagnosis not present

## 2022-12-06 DIAGNOSIS — E039 Hypothyroidism, unspecified: Secondary | ICD-10-CM | POA: Diagnosis not present

## 2023-01-02 DIAGNOSIS — I70209 Unspecified atherosclerosis of native arteries of extremities, unspecified extremity: Secondary | ICD-10-CM | POA: Diagnosis not present

## 2023-01-02 DIAGNOSIS — E039 Hypothyroidism, unspecified: Secondary | ICD-10-CM | POA: Diagnosis not present

## 2023-01-02 DIAGNOSIS — F419 Anxiety disorder, unspecified: Secondary | ICD-10-CM | POA: Diagnosis not present

## 2023-01-02 DIAGNOSIS — E7800 Pure hypercholesterolemia, unspecified: Secondary | ICD-10-CM | POA: Diagnosis not present

## 2023-01-02 DIAGNOSIS — L658 Other specified nonscarring hair loss: Secondary | ICD-10-CM | POA: Diagnosis not present

## 2023-01-02 DIAGNOSIS — I10 Essential (primary) hypertension: Secondary | ICD-10-CM | POA: Diagnosis not present

## 2023-01-18 DIAGNOSIS — E7800 Pure hypercholesterolemia, unspecified: Secondary | ICD-10-CM | POA: Diagnosis not present

## 2023-01-18 DIAGNOSIS — E039 Hypothyroidism, unspecified: Secondary | ICD-10-CM | POA: Diagnosis not present

## 2023-01-19 DIAGNOSIS — L658 Other specified nonscarring hair loss: Secondary | ICD-10-CM | POA: Diagnosis not present

## 2023-01-19 DIAGNOSIS — I70209 Unspecified atherosclerosis of native arteries of extremities, unspecified extremity: Secondary | ICD-10-CM | POA: Diagnosis not present

## 2023-01-19 DIAGNOSIS — I10 Essential (primary) hypertension: Secondary | ICD-10-CM | POA: Diagnosis not present

## 2023-01-19 DIAGNOSIS — E039 Hypothyroidism, unspecified: Secondary | ICD-10-CM | POA: Diagnosis not present

## 2023-01-19 DIAGNOSIS — E7800 Pure hypercholesterolemia, unspecified: Secondary | ICD-10-CM | POA: Diagnosis not present

## 2023-02-01 DIAGNOSIS — L570 Actinic keratosis: Secondary | ICD-10-CM | POA: Diagnosis not present

## 2023-02-01 DIAGNOSIS — X32XXXA Exposure to sunlight, initial encounter: Secondary | ICD-10-CM | POA: Diagnosis not present

## 2023-02-01 DIAGNOSIS — Z1283 Encounter for screening for malignant neoplasm of skin: Secondary | ICD-10-CM | POA: Diagnosis not present

## 2023-02-01 DIAGNOSIS — D225 Melanocytic nevi of trunk: Secondary | ICD-10-CM | POA: Diagnosis not present

## 2023-02-14 DIAGNOSIS — E7800 Pure hypercholesterolemia, unspecified: Secondary | ICD-10-CM | POA: Diagnosis not present

## 2023-02-14 DIAGNOSIS — E039 Hypothyroidism, unspecified: Secondary | ICD-10-CM | POA: Diagnosis not present

## 2023-02-16 DIAGNOSIS — E559 Vitamin D deficiency, unspecified: Secondary | ICD-10-CM | POA: Diagnosis not present

## 2023-02-16 DIAGNOSIS — E7800 Pure hypercholesterolemia, unspecified: Secondary | ICD-10-CM | POA: Diagnosis not present

## 2023-02-16 DIAGNOSIS — I10 Essential (primary) hypertension: Secondary | ICD-10-CM | POA: Diagnosis not present

## 2023-02-16 DIAGNOSIS — E039 Hypothyroidism, unspecified: Secondary | ICD-10-CM | POA: Diagnosis not present

## 2023-02-28 DIAGNOSIS — E039 Hypothyroidism, unspecified: Secondary | ICD-10-CM | POA: Diagnosis not present

## 2023-02-28 DIAGNOSIS — R634 Abnormal weight loss: Secondary | ICD-10-CM | POA: Diagnosis not present

## 2023-02-28 DIAGNOSIS — R42 Dizziness and giddiness: Secondary | ICD-10-CM | POA: Diagnosis not present

## 2023-11-20 DIAGNOSIS — E039 Hypothyroidism, unspecified: Secondary | ICD-10-CM | POA: Diagnosis not present

## 2023-11-20 DIAGNOSIS — M810 Age-related osteoporosis without current pathological fracture: Secondary | ICD-10-CM | POA: Diagnosis not present

## 2023-11-20 DIAGNOSIS — R7303 Prediabetes: Secondary | ICD-10-CM | POA: Diagnosis not present

## 2023-11-20 DIAGNOSIS — I10 Essential (primary) hypertension: Secondary | ICD-10-CM | POA: Diagnosis not present

## 2023-11-21 DIAGNOSIS — I10 Essential (primary) hypertension: Secondary | ICD-10-CM | POA: Diagnosis not present

## 2023-11-23 ENCOUNTER — Other Ambulatory Visit: Payer: Self-pay | Admitting: Internal Medicine

## 2023-11-23 DIAGNOSIS — M810 Age-related osteoporosis without current pathological fracture: Secondary | ICD-10-CM | POA: Diagnosis not present

## 2023-11-23 DIAGNOSIS — I700 Atherosclerosis of aorta: Secondary | ICD-10-CM | POA: Diagnosis not present

## 2023-11-23 DIAGNOSIS — R413 Other amnesia: Secondary | ICD-10-CM | POA: Diagnosis not present

## 2023-11-23 DIAGNOSIS — N1831 Chronic kidney disease, stage 3a: Secondary | ICD-10-CM | POA: Diagnosis not present

## 2023-11-23 DIAGNOSIS — Z0000 Encounter for general adult medical examination without abnormal findings: Secondary | ICD-10-CM | POA: Diagnosis not present

## 2023-11-23 DIAGNOSIS — I70209 Unspecified atherosclerosis of native arteries of extremities, unspecified extremity: Secondary | ICD-10-CM | POA: Diagnosis not present

## 2023-11-23 DIAGNOSIS — R7303 Prediabetes: Secondary | ICD-10-CM | POA: Diagnosis not present

## 2023-11-23 DIAGNOSIS — I10 Essential (primary) hypertension: Secondary | ICD-10-CM | POA: Diagnosis not present

## 2023-11-23 DIAGNOSIS — E039 Hypothyroidism, unspecified: Secondary | ICD-10-CM | POA: Diagnosis not present

## 2024-02-22 DIAGNOSIS — W540XXA Bitten by dog, initial encounter: Secondary | ICD-10-CM | POA: Diagnosis not present

## 2024-02-22 DIAGNOSIS — S51851A Open bite of right forearm, initial encounter: Secondary | ICD-10-CM | POA: Diagnosis not present

## 2024-02-22 DIAGNOSIS — Z203 Contact with and (suspected) exposure to rabies: Secondary | ICD-10-CM | POA: Diagnosis not present

## 2024-02-22 DIAGNOSIS — S41151A Open bite of right upper arm, initial encounter: Secondary | ICD-10-CM | POA: Diagnosis not present

## 2024-02-22 DIAGNOSIS — Z2914 Encounter for prophylactic rabies immune globin: Secondary | ICD-10-CM | POA: Diagnosis not present

## 2024-02-22 DIAGNOSIS — Z23 Encounter for immunization: Secondary | ICD-10-CM | POA: Diagnosis not present

## 2024-02-25 DIAGNOSIS — Z203 Contact with and (suspected) exposure to rabies: Secondary | ICD-10-CM | POA: Diagnosis not present

## 2024-02-25 DIAGNOSIS — Z2914 Encounter for prophylactic rabies immune globin: Secondary | ICD-10-CM | POA: Diagnosis not present

## 2024-02-28 DIAGNOSIS — Z2914 Encounter for prophylactic rabies immune globin: Secondary | ICD-10-CM | POA: Diagnosis not present

## 2024-02-28 DIAGNOSIS — Z203 Contact with and (suspected) exposure to rabies: Secondary | ICD-10-CM | POA: Diagnosis not present

## 2024-03-07 ENCOUNTER — Encounter: Payer: Self-pay | Admitting: Neurology

## 2024-03-07 DIAGNOSIS — R413 Other amnesia: Secondary | ICD-10-CM | POA: Insufficient documentation

## 2024-03-13 ENCOUNTER — Encounter: Payer: Self-pay | Admitting: Neurology

## 2024-03-13 ENCOUNTER — Ambulatory Visit (INDEPENDENT_AMBULATORY_CARE_PROVIDER_SITE_OTHER): Admitting: Neurology

## 2024-03-13 VITALS — BP 170/64 | HR 87 | Ht 59.0 in | Wt 103.5 lb

## 2024-03-13 DIAGNOSIS — G3184 Mild cognitive impairment, so stated: Secondary | ICD-10-CM

## 2024-03-13 DIAGNOSIS — R413 Other amnesia: Secondary | ICD-10-CM

## 2024-03-13 NOTE — Patient Instructions (Signed)
 Will obtain dementia labs including TSH and B12  Continue your other medications  Continue to follow up with PCP  Return as needed   There are well-accepted and sensible ways to reduce risk for Alzheimers disease and other degenerative brain disorders .  Exercise Daily Walk A daily 20 minute walk should be part of your routine. Disease related apathy can be a significant roadblock to exercise and the only way to overcome this is to make it a daily routine and perhaps have a reward at the end (something your loved one loves to eat or drink perhaps) or a personal trainer coming to the home can also be very useful. Most importantly, the patient is much more likely to exercise if the caregiver / spouse does it with him/her. In general a structured, repetitive schedule is best.  General Health: Any diseases which effect your body will effect your brain such as a pneumonia, urinary infection, blood clot, heart attack or stroke. Keep contact with your primary care doctor for regular follow ups.  Sleep. A good nights sleep is healthy for the brain. Seven hours is recommended. If you have insomnia or poor sleep habits we can give you some instructions. If you have sleep apnea wear your mask.  Diet: Eating a heart healthy diet is also a good idea; fish and poultry instead of red meat, nuts (mostly non-peanuts), vegetables, fruits, olive oil or canola oil (instead of butter), minimal salt (use other spices to flavor foods), whole grain rice, bread, cereal and pasta and wine in moderation.Research is now showing that the MIND diet, which is a combination of The Mediterranean diet and the DASH diet, is beneficial for cognitive processing and longevity. Information about this diet can be found in The MIND Diet, a book by Andria Keeler, MS, RDN, and online at [employer]  Finances, Power of [address] and Advance Directives: You should consider putting legal safeguards in place with  regard to financial and medical decision making. While the spouse always has power of attorney for medical and financial issues in the absence of any form, you should consider what you want in case the spouse / caregiver is no longer around or capable of making decisions.

## 2024-03-13 NOTE — Progress Notes (Signed)
 [facility]  PATIENT: Danielle Proctor DOB: Sep 21, 1929  REQUESTING CLINICIAN: Imelda Man, MD HISTORY FROM: Patient/Daughter  REASON FOR VISIT: Memory loss    HISTORICAL  CHIEF COMPLAINT:  Chief Complaint  Patient presents with   New Patient (Initial Visit)    Rm 13. NEW PATIENT 30 - MMSE NP proficient paper referral for Memory loss.  MMSE Score: 22    HISTORY OF PRESENT ILLNESS:  This is a 89 year old woman past medical history hypertension, hypothyroidism who is presenting with her daughter for memory loss.  Patient tells me that her memory is not what it used to be, she is somehow forgetful.  Daughter tells me the memory loss has been going on for the past year and a half.  Patient was independent throughout her life until about a year and a half when daughter has noted that she has not been paying her car registration, she was not paying her bills, people calling her to get money from her credit card and daughter had to take over the bills and her checkbook.  She again is very independent, lives alone, still very active but sometimes she is repetitive.  They deny any major short-term memory, denies forgetting important dates, forgetting family members names or having difficulty taking her medications.   TBI:   No past history of TBI Stroke:   no past history of stroke Seizures:   no past history of seizures Sleep:   no history of sleep apnea.  Mood: patient denies anxiety and depression Family history of Dementia: Great aunt   Functional status: independent in all ADLs and IADLs Patient lives with alone. Cooking: yes, microwave Cleaning: yes Shopping: with her daughter Bathing: no help needed Toileting: no help needed Driving: short distance  Bills: daughter took over  Medications: self Ever left the stove on by accident?: denies Forget how to use items around the house?: denies Getting lost going to familiar places?: denies Forgetting loved  ones names?: denies Word finding difficulty? no Sleep: Good    OTHER MEDICAL CONDITIONS: Hypertension, Hypothyroidism   REVIEW OF SYSTEMS: Full 14 system review of systems performed and negative with exception of: As noted in the HPI   ALLERGIES: Allergies  Allergen Reactions   Sulfa Antibiotics Nausea And Vomiting    HOME MEDICATIONS: Outpatient Medications Prior to Visit  Medication Sig Dispense Refill   acetaminophen (TYLENOL) 325 MG tablet Take 650 mg by mouth every 6 (six) hours as needed.     ascorbic acid (VITAMIN C) 500 MG tablet Take 500 mg by mouth daily.     aspirin EC 81 MG tablet Take 81 mg by mouth daily.     b complex vitamins capsule Take 1 capsule by mouth daily.     cholecalciferol (VITAMIN D) 1000 UNITS tablet Take 2,000 Units by mouth daily.     ibuprofen (ADVIL) 100 MG tablet Take 100 mg by mouth every 6 (six) hours as needed for fever.     levothyroxine (SYNTHROID, LEVOTHROID) 88 MCG tablet Take 88 mcg by mouth daily before breakfast.     LORazepam  (ATIVAN ) 0.5 MG tablet Take 0.5 tablets (0.25 mg total) by mouth every 8 (eight) hours as needed for anxiety. 10 tablet 0   Melatonin 3 MG CAPS Take 1 Capful by mouth as needed.     docusate sodium  (COLACE) 50 MG capsule Take 1 capsule (50 mg total) by mouth 2 (two) times daily. (Patient not taking: Reported on 03/13/2024) 14 capsule 0   Glycerin-Polysorbate  80 (REFRESH DRY EYE THERAPY OP) Apply to eye. (Patient not taking: Reported on 03/13/2024)     hydrocortisone cream 1 % Apply 1 application topically daily as needed for itching. (Patient not taking: Reported on 03/13/2024)     No facility-administered medications prior to visit.    PAST MEDICAL HISTORY: Past Medical History:  Diagnosis Date   Atherosclerotic peripheral vascular disease (HCC)    Diverticulosis    Hypercholesteremia    Hypertension    IBS (irritable bowel syndrome)    Osteoarthritis    Osteoporosis    Thyroid  disease     PAST SURGICAL  HISTORY: Past Surgical History:  Procedure Laterality Date   APPENDECTOMY     CHOLECYSTECTOMY     COLON SURGERY      FAMILY HISTORY: Family History  Problem Relation Age of Onset   Heart attack Mother    Heart attack Father    Lung cancer Brother     SOCIAL HISTORY: Social History   Socioeconomic History   Marital status: Divorced    Spouse name: Not on file   Number of children: Not on file   Years of education: Not on file   Highest education level: Not on file  Occupational History   Not on file  Tobacco Use   Smoking status: Never   Smokeless tobacco: Never  Vaping Use   Vaping status: Never Used  Substance and Sexual Activity   Alcohol use: Not Currently    Comment: Socially    Drug use: No   Sexual activity: Not on file  Other Topics Concern   Not on file  Social History Narrative   Not on file   Social Drivers of Health   Financial Resource Strain: Not on file  Food Insecurity: Not on file  Transportation Needs: No Transportation Needs (05/23/2022)   PRAPARE - Transportation    Lack of Transportation (Medical): No    Lack of Transportation (Non-Medical): No  Physical Activity: Not on file  Stress: Not on file  Social Connections: Not on file  Intimate Partner Violence: Not on file    PHYSICAL EXAM  GENERAL EXAM/CONSTITUTIONAL: Vitals:  Vitals:   03/13/24 1414  BP: (!) 170/64  Pulse: 87  Weight: 103 lb 8 oz (46.9 kg)  Height: 4\' 11"  (1.499 m)   Body mass index is 20.9 kg/m. Wt Readings from Last 3 Encounters:  03/13/24 103 lb 8 oz (46.9 kg)  06/24/22 112 lb (50.8 kg)  06/01/22 127 lb (57.6 kg)   Patient is in no distress; well developed, nourished and groomed; neck is supple  MUSCULOSKELETAL: Gait, strength, tone, movements noted in Neurologic exam below  NEUROLOGIC: MENTAL STATUS:     03/13/2024    2:17 PM  MMSE - Mini Mental State Exam  Orientation to time 2  Orientation to Place 5  Registration 3  Attention/ Calculation 3   Recall 0  Language- name 2 objects 2  Language- repeat 1  Language- follow 3 step command 3  Language- read & follow direction 1  Write a sentence 1  Copy design 1  Total score 22   awake, alert, oriented to person recent and remote memory intact normal attention and concentration language fluent, comprehension intact, naming intact fund of knowledge appropriate  CRANIAL NERVE:  2nd, 3rd, 4th, 6th- visual fields full to confrontation, extraocular muscles intact, no nystagmus 5th - facial sensation symmetric 7th - facial strength symmetric 8th - hearing intact 9th - palate elevates symmetrically, uvula midline 11th -  shoulder shrug symmetric 12th - tongue protrusion midline  MOTOR:  normal bulk and tone, full strength in the BUE, BLE  SENSORY:  normal and symmetric to light touch  COORDINATION:  finger-nose-finger, fine finger movements normal  GAIT/STATION:  normal    DIAGNOSTIC DATA (LABS, IMAGING, TESTING) - I reviewed patient records, labs, notes, testing and imaging myself where available.  Lab Results  Component Value Date   WBC 8.3 06/01/2022   HGB 13.1 06/01/2022   HCT 38.6 06/01/2022   MCV 91.5 06/01/2022   PLT 288 06/01/2022      Component Value Date/Time   NA 137 06/01/2022 1950   K 3.9 06/01/2022 1950   CL 102 06/01/2022 1950   CO2 23 06/01/2022 1950   GLUCOSE 112 (H) 06/01/2022 1950   BUN 19 06/01/2022 1950   CREATININE 1.02 (H) 06/01/2022 1950   CALCIUM 9.6 06/01/2022 1950   PROT 7.1 01/12/2022 2216   ALBUMIN 4.2 01/12/2022 2216   AST 22 01/12/2022 2216   ALT 15 01/12/2022 2216   ALKPHOS 19 (L) 01/12/2022 2216   BILITOT 0.8 01/12/2022 2216   GFRNONAA 52 (L) 06/01/2022 1950   GFRAA >60 06/06/2016 1228   No results found for: "CHOL", "HDL", "LDLCALC", "LDLDIRECT", "TRIG", "CHOLHDL" No results found for: "HGBA1C" No results found for: "VITAMINB12" No results found for: "TSH"  TSH  4.810 11/20/2023    ASSESSMENT AND PLAN  89 y.o.  year old female with history of hypertension, hypothyroidism who is presenting with memory loss for the past year and a half.  Memory loss is described as being a little bit forgetful, difficulty paying her bills but otherwise remains independent in all ADLs.  She still lives alone, exercises daily, continue to do her game shows and doing her puzzles.  Her MMSE score was 22 out of 30 indicative of impairment.  I have informed patient and family that she likely has mild cognitive impairment but I would not start medication at the moment.  She is clinically stable, no focal neurological deficit, patient and family asking if MRI Brain is needed. I have informed them that it part of the work up but clinically stable, no focal deficits, we can defer it at the moment. Patient and family are comfortable with plans.  Will obtain B12 and TSH level.  For the ATN, since we are not planning to start medication, patient and daughter would like to defer that test, they mentioned that having positive results can be a source of anxiety.  They will continue to follow with Dr. Schuyler Custard and to return as needed or if her symptoms do get worse.   1. Mild cognitive impairment      Patient Instructions  Will obtain dementia labs including TSH and B12  Continue your other medications  Continue to follow up with PCP  Return as needed   There are well-accepted and sensible ways to reduce risk for Alzheimers disease and other degenerative brain disorders .  Exercise Daily Walk A daily 20 minute walk should be part of your routine. Disease related apathy can be a significant roadblock to exercise and the only way to overcome this is to make it a daily routine and perhaps have a reward at the end (something your loved one loves to eat or drink perhaps) or a personal trainer coming to the home can also be very useful. Most importantly, the patient is much more likely to exercise if the caregiver / spouse does it with him/her. In  general  a structured, repetitive schedule is best.  General Health: Any diseases which effect your body will effect your brain such as a pneumonia, urinary infection, blood clot, heart attack or stroke. Keep contact with your primary care doctor for regular follow ups.  Sleep. A good nights sleep is healthy for the brain. Seven hours is recommended. If you have insomnia or poor sleep habits we can give you some instructions. If you have sleep apnea wear your mask.  Diet: Eating a heart healthy diet is also a good idea; fish and poultry instead of red meat, nuts (mostly non-peanuts), vegetables, fruits, olive oil or canola oil (instead of butter), minimal salt (use other spices to flavor foods), whole grain rice, bread, cereal and pasta and wine in moderation.Research is now showing that the MIND diet, which is a combination of The Mediterranean diet and the DASH diet, is beneficial for cognitive processing and longevity. Information about this diet can be found in The MIND Diet, a book by Andria Keeler, MS, RDN, and online at [employer]  Finances, Power of [address] and Advance Directives: You should consider putting legal safeguards in place with regard to financial and medical decision making. While the spouse always has power of attorney for medical and financial issues in the absence of any form, you should consider what you want in case the spouse / caregiver is no longer around or capable of making decisions.   Orders Placed This Encounter  Procedures   TSH   Vitamin B12    No orders of the defined types were placed in this encounter.   Return if symptoms worsen or fail to improve.  I have spent a total of 65 minutes dedicated to this patient today, preparing to see patient, performing a medically appropriate examination and evaluation, ordering tests and/or medications and procedures, and counseling and educating the patient/family/caregiver; independently  interpreting result and communicating results to the family/patient/caregiver; and documenting clinical information in the electronic medical record.   Cassandra Cleveland, MD 03/13/2024, 3:09 PM  [facility] [address] [place], [place] [address] [phone]

## 2024-03-14 ENCOUNTER — Ambulatory Visit: Payer: Self-pay | Admitting: Neurology

## 2024-03-14 LAB — TSH: TSH: 0.647 u[IU]/mL (ref 0.450–4.500)

## 2024-03-14 LAB — VITAMIN B12: Vitamin B-12: 380 pg/mL (ref 232–1245)

## 2024-03-16 IMAGING — DX DG ABDOMEN 2V
2 series · 2 of 2 positions shown · non-contrast
Comparison: May 21, 2004

CLINICAL DATA: Constipation.

EXAM:
ABDOMEN - 2 VIEW

[abdomen erect]
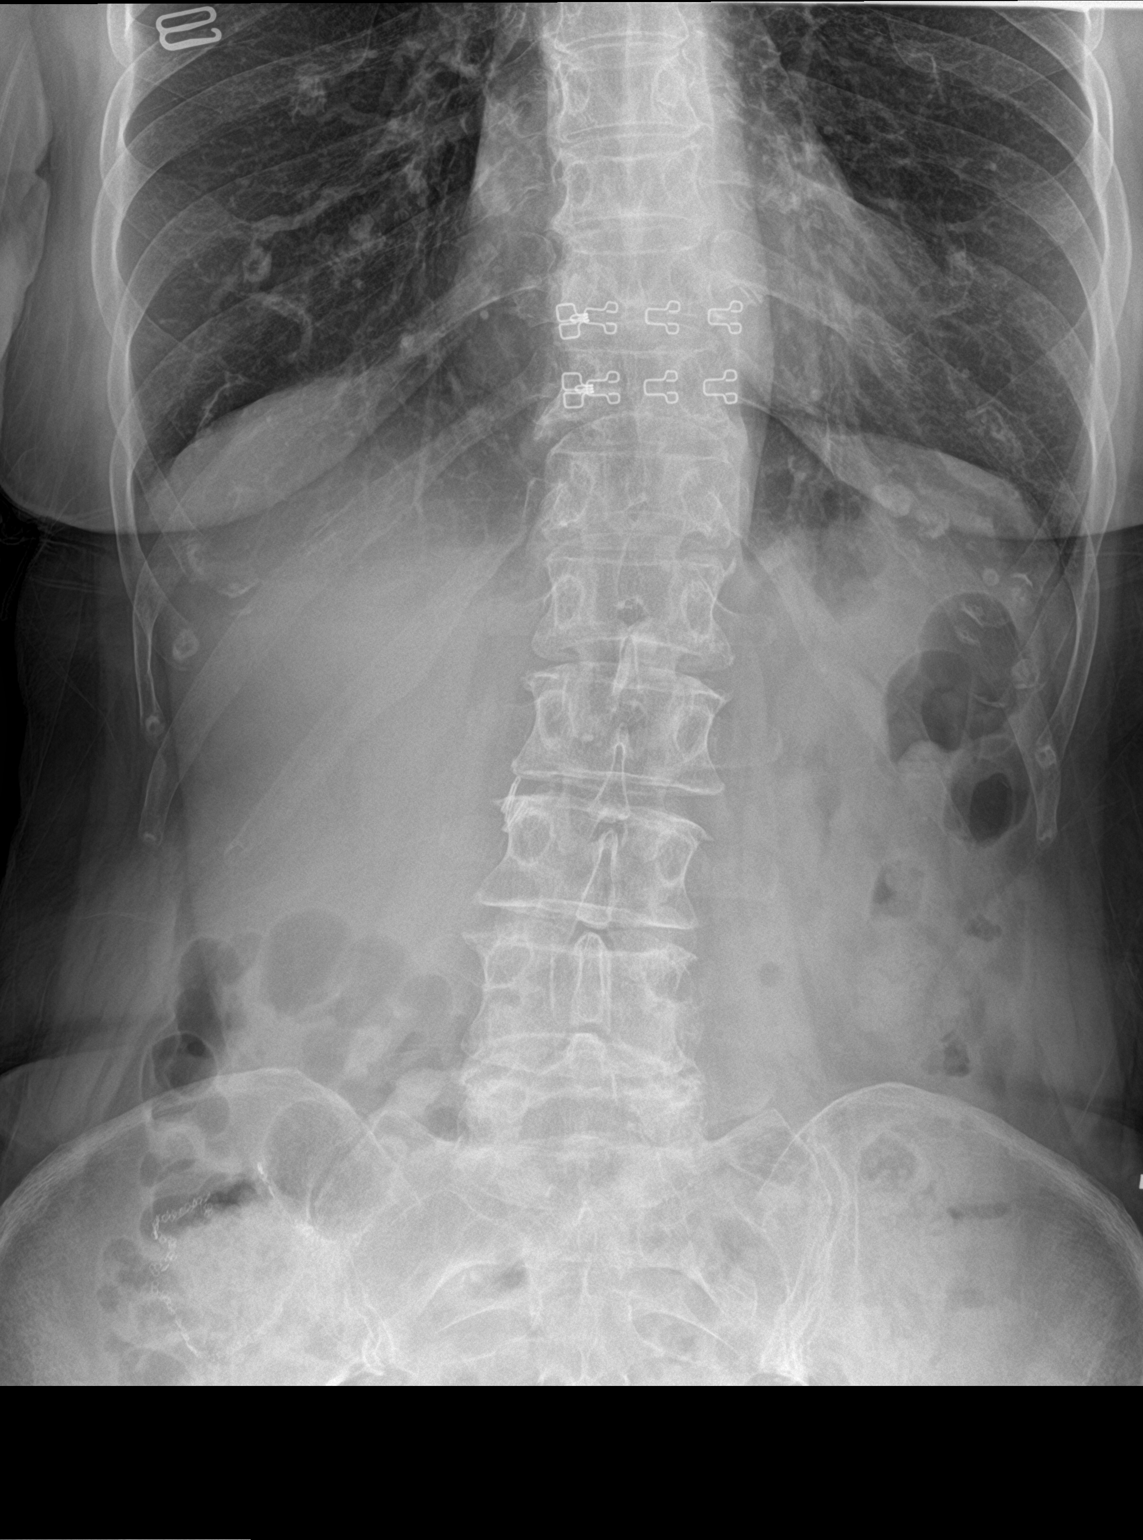

[abdomen supine]
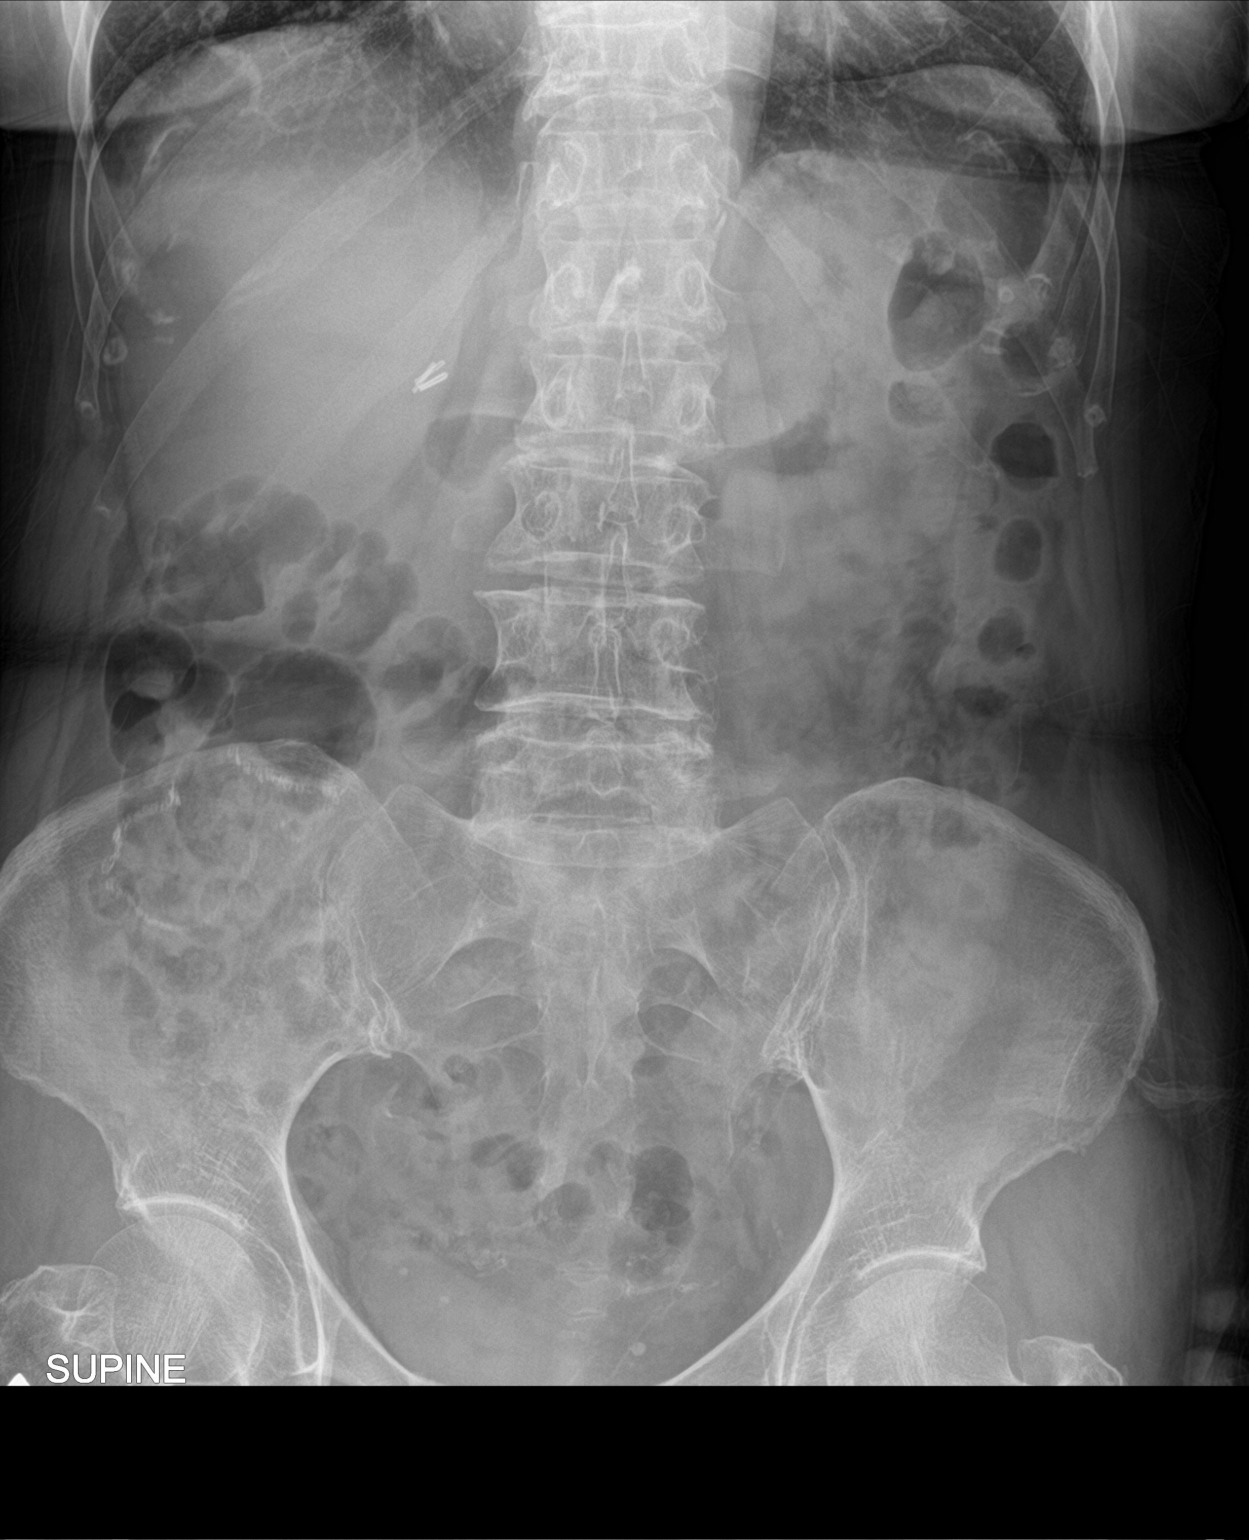

[2 of 2 positions shown; findings below may reference images not displayed]

FINDINGS: The bowel gas pattern is normal. A mild stool burden is seen. There
is no evidence of free air. Radiopaque surgical clips are seen
within the right upper quadrant. Surgical sutures noted within the
lateral aspect of the mid to lower right abdomen. No radio-opaque
calculi or other significant radiographic abnormality is seen.
IMPRESSION: Mild stool burden without evidence of bowel obstruction.

## 2024-03-17 IMAGING — CT CT ABD-PELV W/ CM
2 of 5 series · 16 of 46 positions shown, 18 images · IV contrast (APPLIED)
Comparison: 06/22/2014.

CLINICAL DATA: Abdominal pain, constipation, bloating and poor
appetite. Bleeding following enema.

EXAM:
CT ABDOMEN AND PELVIS WITH CONTRAST
TECHNIQUE: Multidetector CT imaging of the abdomen and pelvis was performed
using the standard protocol following bolus administration of
intravenous contrast.

[Series 4: abdomen 5.0 · axial · 0.71mm/px · z∈[+523,+883]mm · 13 of 82 slices shown, 15 images]
[im 5/82  soft-tissue]
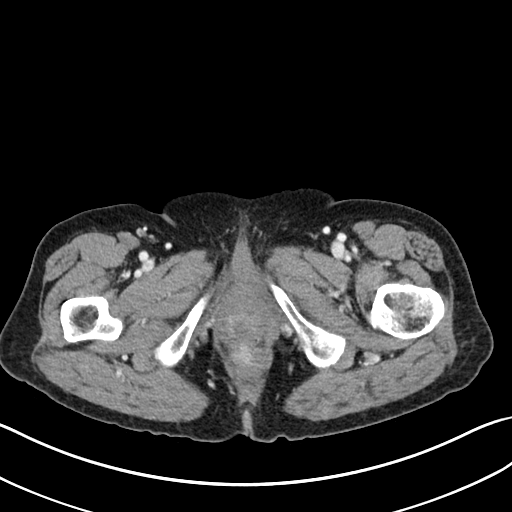
[im 5/82  bone]
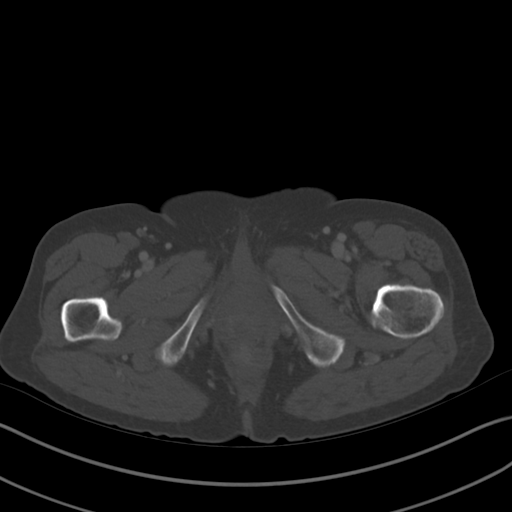
[im 10/82  soft-tissue]
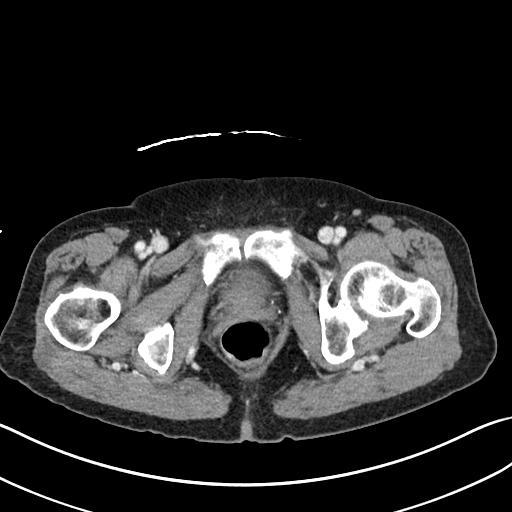
[im 20/82  soft-tissue]
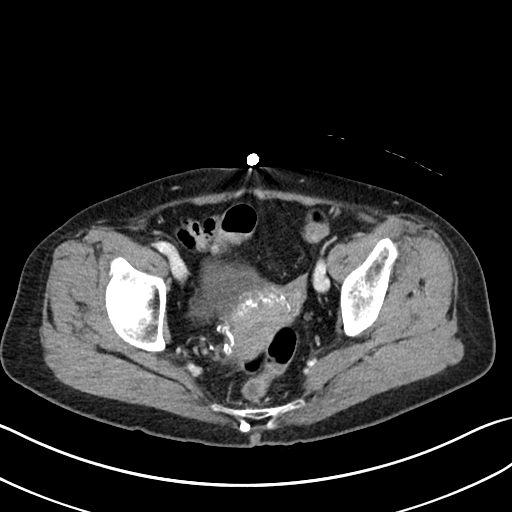
[im 24/82  soft-tissue]
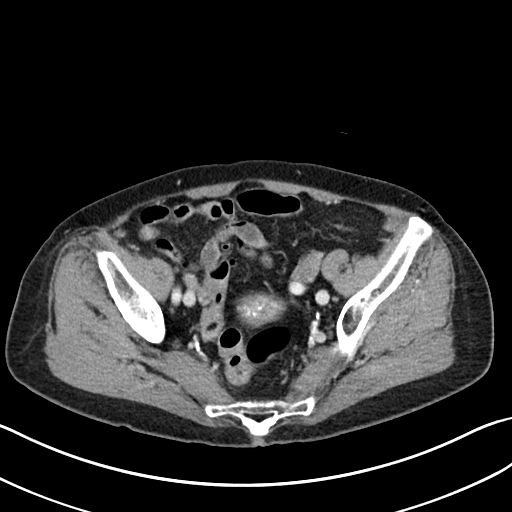
[im 29/82  soft-tissue]
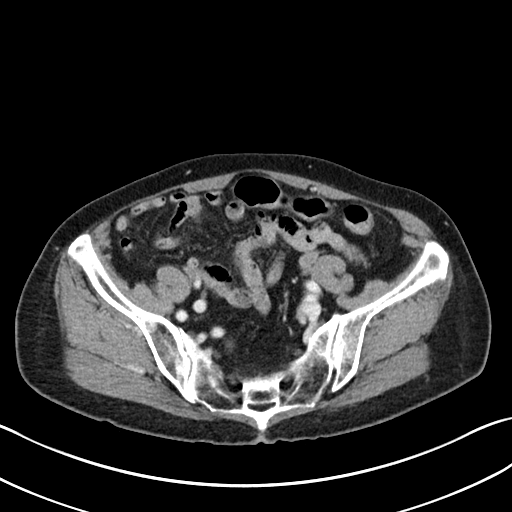
[im 34/82  soft-tissue]
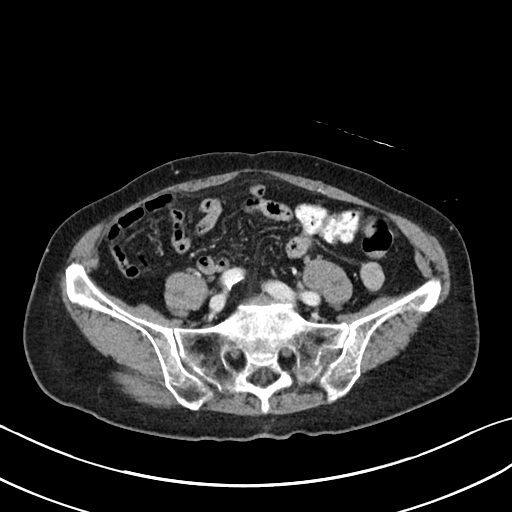
[im 43/82  soft-tissue]
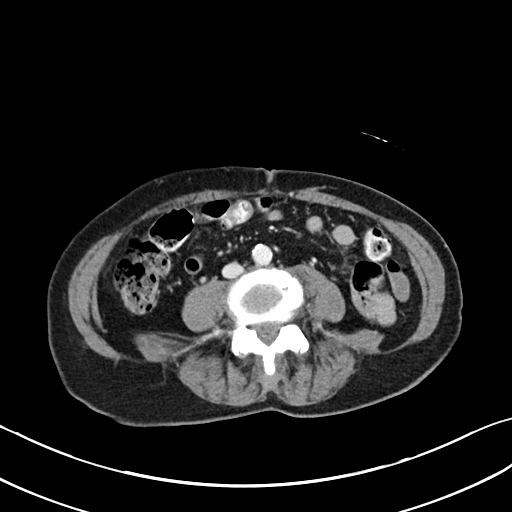
[im 48/82  soft-tissue]
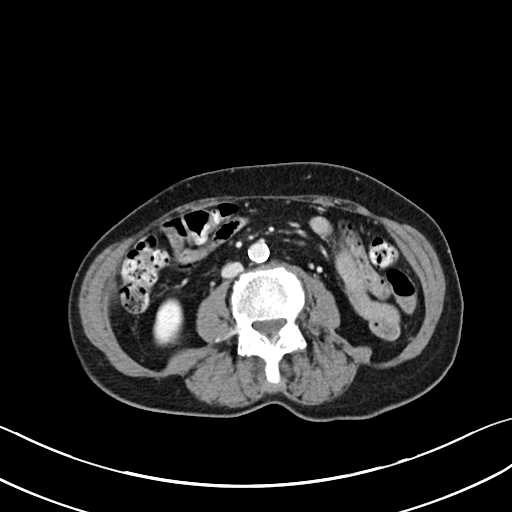
[im 53/82  soft-tissue]
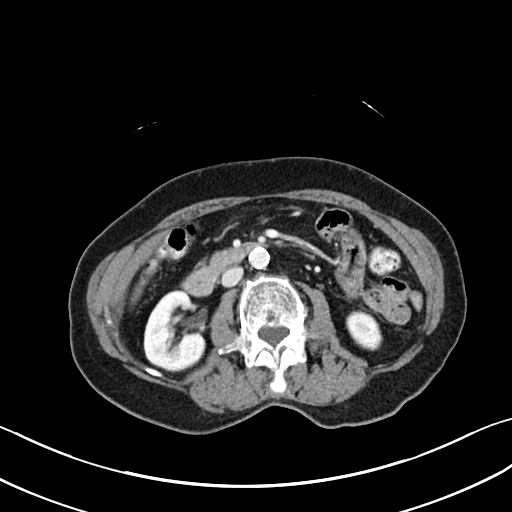
[im 53/82  bone]
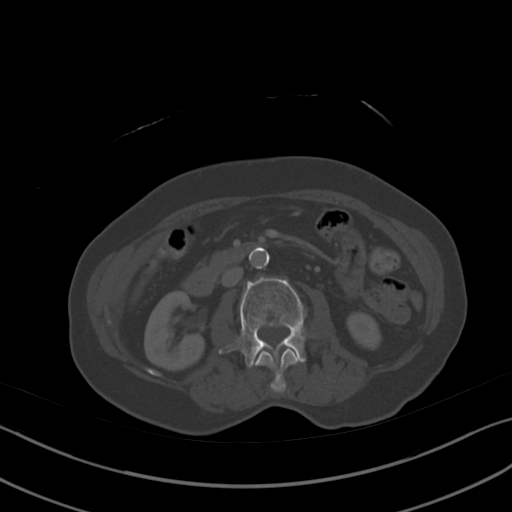
[im 58/82  soft-tissue]
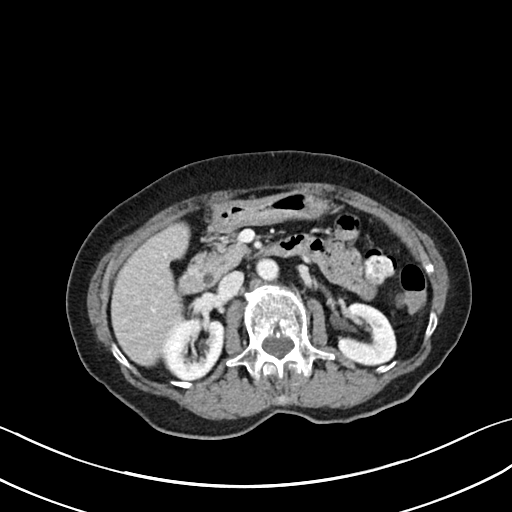
[im 62/82  soft-tissue]
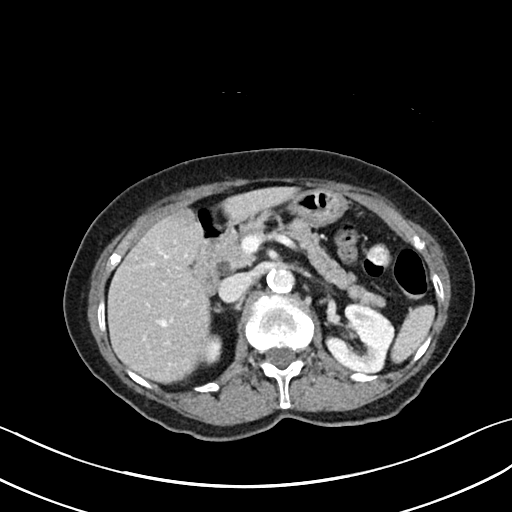
[im 72/82  soft-tissue]
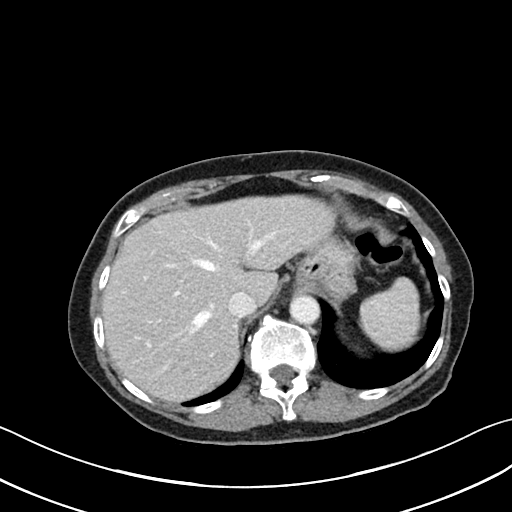
[im 77/82  soft-tissue]
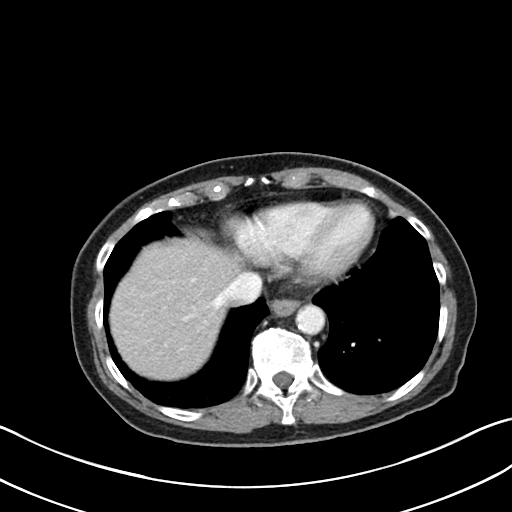

[Series 7: abdomen 3.0 mpr cor · coronal · 0.65mm/px · 3 of 72 slices shown]
[im 24/72  soft-tissue]
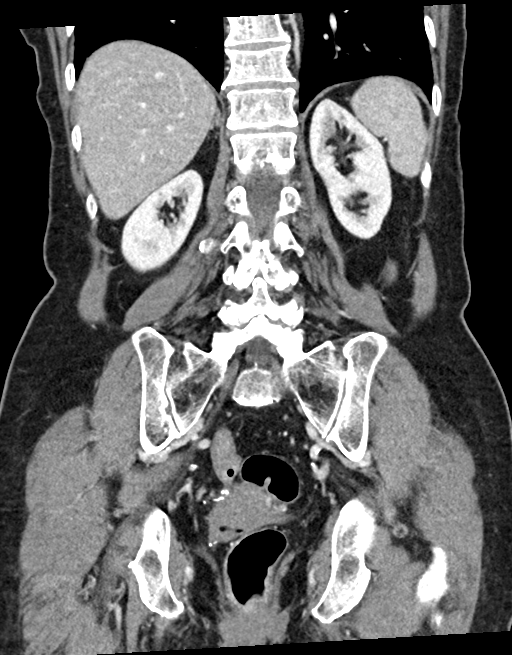
[im 32/72  soft-tissue]
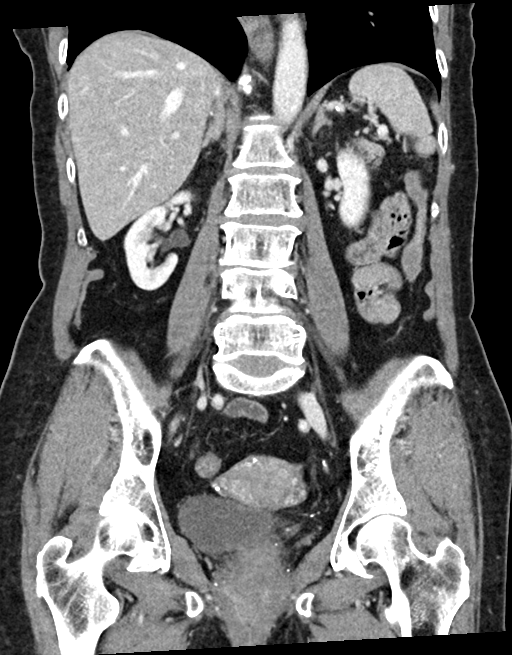
[im 40/72  soft-tissue]
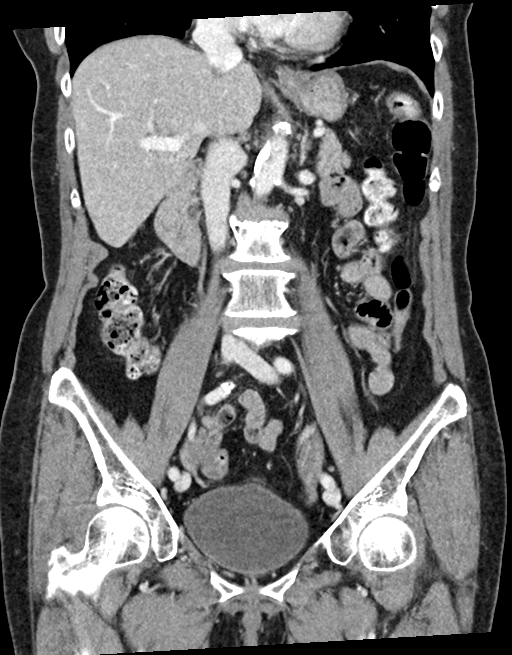

[16 of 46 positions shown; findings below may reference images not displayed]

RADIATION DOSE REDUCTION: This exam was performed according to the
departmental dose-optimization program which includes automated
exposure control, adjustment of the mA and/or kV according to
patient size and/or use of iterative reconstruction technique.

CONTRAST:  80mL OMNIPAQUE IOHEXOL 300 MG/ML  SOLN
FINDINGS: Lower chest: No acute abnormality.

Hepatobiliary: There is focal fatty infiltration in the liver
adjacent to the falciform ligament. The common bile duct is mildly
distended which is likely related to post cholecystectomy status.

Pancreas: Unremarkable. No pancreatic ductal dilatation or
surrounding inflammatory changes.

Spleen: Normal in size without focal abnormality.

Adrenals/Urinary Tract: Adrenal glands are unremarkable. The kidneys
enhance symmetrically. No renal calculus or hydronephrosis.
Subcentimeter hypodensities are present in the kidneys which are too
small to further characterize. Bladder is unremarkable.

Stomach/Bowel: There is a small hiatal hernia. No bowel obstruction,
free air, or pneumatosis. A few scattered diverticula are noted
along the colon without evidence of diverticulitis. Surgical changes
are present at the cecum in the appendix is not visualized on exam.
A mild-to-moderate amount of retained stool is present in the colon.
No focal bowel wall thickening is identified.

Vascular/Lymphatic: Aortic atherosclerosis. No enlarged abdominal or
pelvic lymph nodes.

Reproductive: Uterus and bilateral adnexa are unremarkable.

Other: No abdominal wall hernia or abnormality. No abdominopelvic
ascites.

Musculoskeletal: No acute or suspicious osseous abnormality.
IMPRESSION: 1. No acute intra-abdominal process.
2. Mild moderate amount of residual stool in the colon.
3. Diverticulosis without diverticulitis.
4. Small hiatal hernia.
5. Aortic atherosclerosis.

## 2024-04-10 ENCOUNTER — Other Ambulatory Visit: Payer: Self-pay

## 2024-04-10 ENCOUNTER — Inpatient Hospital Stay (HOSPITAL_COMMUNITY)
Admission: EM | Admit: 2024-04-10 | Discharge: 2024-04-14 | DRG: 520 | Disposition: A | Discharge status: Home / Self Care | Attending: Internal Medicine | Admitting: Internal Medicine

## 2024-04-10 ENCOUNTER — Emergency Department (HOSPITAL_COMMUNITY)

## 2024-04-10 ENCOUNTER — Encounter (HOSPITAL_COMMUNITY): Payer: Self-pay

## 2024-04-10 DIAGNOSIS — Z8249 Family history of ischemic heart disease and other diseases of the circulatory system: Secondary | ICD-10-CM | POA: Diagnosis not present

## 2024-04-10 DIAGNOSIS — E039 Hypothyroidism, unspecified: Secondary | ICD-10-CM | POA: Diagnosis not present

## 2024-04-10 DIAGNOSIS — S32059A Unspecified fracture of fifth lumbar vertebra, initial encounter for closed fracture: Secondary | ICD-10-CM | POA: Diagnosis not present

## 2024-04-10 DIAGNOSIS — Z888 Allergy status to other drugs, medicaments and biological substances status: Secondary | ICD-10-CM | POA: Diagnosis not present

## 2024-04-10 DIAGNOSIS — Z66 Do not resuscitate: Secondary | ICD-10-CM | POA: Diagnosis present

## 2024-04-10 DIAGNOSIS — I493 Ventricular premature depolarization: Secondary | ICD-10-CM | POA: Diagnosis present

## 2024-04-10 DIAGNOSIS — M8088XA Other osteoporosis with current pathological fracture, vertebra(e), initial encounter for fracture: Principal | ICD-10-CM | POA: Diagnosis present

## 2024-04-10 DIAGNOSIS — I70202 Unspecified atherosclerosis of native arteries of extremities, left leg: Secondary | ICD-10-CM | POA: Diagnosis not present

## 2024-04-10 DIAGNOSIS — Z9181 History of falling: Secondary | ICD-10-CM | POA: Diagnosis not present

## 2024-04-10 DIAGNOSIS — G3184 Mild cognitive impairment, so stated: Secondary | ICD-10-CM | POA: Diagnosis present

## 2024-04-10 DIAGNOSIS — Z801 Family history of malignant neoplasm of trachea, bronchus and lung: Secondary | ICD-10-CM

## 2024-04-10 DIAGNOSIS — M47816 Spondylosis without myelopathy or radiculopathy, lumbar region: Secondary | ICD-10-CM | POA: Diagnosis not present

## 2024-04-10 DIAGNOSIS — D638 Anemia in other chronic diseases classified elsewhere: Secondary | ICD-10-CM | POA: Diagnosis not present

## 2024-04-10 DIAGNOSIS — W19XXXA Unspecified fall, initial encounter: Secondary | ICD-10-CM | POA: Diagnosis not present

## 2024-04-10 DIAGNOSIS — S199XXA Unspecified injury of neck, initial encounter: Secondary | ICD-10-CM | POA: Diagnosis not present

## 2024-04-10 DIAGNOSIS — E538 Deficiency of other specified B group vitamins: Secondary | ICD-10-CM | POA: Diagnosis present

## 2024-04-10 DIAGNOSIS — S329XXA Fracture of unspecified parts of lumbosacral spine and pelvis, initial encounter for closed fracture: Secondary | ICD-10-CM | POA: Diagnosis not present

## 2024-04-10 DIAGNOSIS — G4700 Insomnia, unspecified: Secondary | ICD-10-CM | POA: Diagnosis present

## 2024-04-10 DIAGNOSIS — E7800 Pure hypercholesterolemia, unspecified: Secondary | ICD-10-CM | POA: Diagnosis present

## 2024-04-10 DIAGNOSIS — K838 Other specified diseases of biliary tract: Secondary | ICD-10-CM | POA: Diagnosis not present

## 2024-04-10 DIAGNOSIS — Z882 Allergy status to sulfonamides status: Secondary | ICD-10-CM | POA: Diagnosis not present

## 2024-04-10 DIAGNOSIS — R918 Other nonspecific abnormal finding of lung field: Secondary | ICD-10-CM | POA: Diagnosis not present

## 2024-04-10 DIAGNOSIS — Z9049 Acquired absence of other specified parts of digestive tract: Secondary | ICD-10-CM | POA: Diagnosis not present

## 2024-04-10 DIAGNOSIS — I82442 Acute embolism and thrombosis of left tibial vein: Secondary | ICD-10-CM | POA: Diagnosis not present

## 2024-04-10 DIAGNOSIS — I701 Atherosclerosis of renal artery: Secondary | ICD-10-CM | POA: Diagnosis not present

## 2024-04-10 DIAGNOSIS — K589 Irritable bowel syndrome without diarrhea: Secondary | ICD-10-CM | POA: Diagnosis present

## 2024-04-10 DIAGNOSIS — K5730 Diverticulosis of large intestine without perforation or abscess without bleeding: Secondary | ICD-10-CM | POA: Diagnosis not present

## 2024-04-10 DIAGNOSIS — I70209 Unspecified atherosclerosis of native arteries of extremities, unspecified extremity: Secondary | ICD-10-CM | POA: Diagnosis not present

## 2024-04-10 DIAGNOSIS — Z7982 Long term (current) use of aspirin: Secondary | ICD-10-CM | POA: Diagnosis not present

## 2024-04-10 DIAGNOSIS — S299XXA Unspecified injury of thorax, initial encounter: Secondary | ICD-10-CM | POA: Diagnosis not present

## 2024-04-10 DIAGNOSIS — M533 Sacrococcygeal disorders, not elsewhere classified: Secondary | ICD-10-CM | POA: Diagnosis present

## 2024-04-10 DIAGNOSIS — I959 Hypotension, unspecified: Secondary | ICD-10-CM | POA: Diagnosis not present

## 2024-04-10 DIAGNOSIS — I10 Essential (primary) hypertension: Secondary | ICD-10-CM | POA: Diagnosis not present

## 2024-04-10 DIAGNOSIS — S32009A Unspecified fracture of unspecified lumbar vertebra, initial encounter for closed fracture: Secondary | ICD-10-CM

## 2024-04-10 DIAGNOSIS — R55 Syncope and collapse: Secondary | ICD-10-CM | POA: Diagnosis present

## 2024-04-10 DIAGNOSIS — Z79890 Hormone replacement therapy (postmenopausal): Secondary | ICD-10-CM

## 2024-04-10 DIAGNOSIS — S0990XA Unspecified injury of head, initial encounter: Secondary | ICD-10-CM | POA: Diagnosis not present

## 2024-04-10 DIAGNOSIS — I672 Cerebral atherosclerosis: Secondary | ICD-10-CM | POA: Diagnosis not present

## 2024-04-10 DIAGNOSIS — S3210XA Unspecified fracture of sacrum, initial encounter for closed fracture: Secondary | ICD-10-CM | POA: Diagnosis not present

## 2024-04-10 DIAGNOSIS — D72829 Elevated white blood cell count, unspecified: Secondary | ICD-10-CM | POA: Diagnosis not present

## 2024-04-10 DIAGNOSIS — S3993XA Unspecified injury of pelvis, initial encounter: Secondary | ICD-10-CM | POA: Diagnosis not present

## 2024-04-10 DIAGNOSIS — M4316 Spondylolisthesis, lumbar region: Secondary | ICD-10-CM | POA: Diagnosis not present

## 2024-04-10 LAB — CBC
HCT: 37.3 % (ref 36.0–46.0)
Hemoglobin: 12.6 g/dL (ref 12.0–15.0)
MCH: 31.1 pg (ref 26.0–34.0)
MCHC: 33.8 g/dL (ref 30.0–36.0)
MCV: 92.1 fL (ref 80.0–100.0)
Platelets: 249 10*3/uL (ref 150–400)
RBC: 4.05 MIL/uL (ref 3.87–5.11)
RDW: 12.3 % (ref 11.5–15.5)
WBC: 7.9 10*3/uL (ref 4.0–10.5)
nRBC: 0 % (ref 0.0–0.2)

## 2024-04-10 LAB — COMPREHENSIVE METABOLIC PANEL WITH GFR
ALT: 25 U/L (ref 0–44)
AST: 29 U/L (ref 15–41)
Albumin: 4 g/dL (ref 3.5–5.0)
Alkaline Phosphatase: 24 U/L — ABNORMAL LOW (ref 38–126)
Anion gap: 12 (ref 5–15)
BUN: 25 mg/dL — ABNORMAL HIGH (ref 8–23)
CO2: 24 mmol/L (ref 22–32)
Calcium: 9.8 mg/dL (ref 8.9–10.3)
Chloride: 104 mmol/L (ref 98–111)
Creatinine, Ser: 0.9 mg/dL (ref 0.44–1.00)
GFR, Estimated: 60 mL/min — ABNORMAL LOW (ref >60)
Glucose, Bld: 175 mg/dL — ABNORMAL HIGH (ref 70–99)
Potassium: 3.6 mmol/L (ref 3.5–5.1)
Sodium: 140 mmol/L (ref 135–145)
Total Bilirubin: 0.4 mg/dL (ref 0.0–1.2)
Total Protein: 6.7 g/dL (ref 6.5–8.1)

## 2024-04-10 LAB — TROPONIN I (HIGH SENSITIVITY): Troponin I (High Sensitivity): 3 ng/L

## 2024-04-10 LAB — ETHANOL: Alcohol, Ethyl (B): <15 mg/dL

## 2024-04-10 LAB — MAGNESIUM: Magnesium: 2.3 mg/dL (ref 1.7–2.4)

## 2024-04-10 MED ORDER — ONDANSETRON HCL 4 MG PO TABS: 4.0000 mg | ORAL_TABLET | Freq: Four times a day (QID) | ORAL | Status: DC | PRN

## 2024-04-10 MED ORDER — LORAZEPAM 0.5 MG PO TABS
0.5000 mg | ORAL_TABLET | Freq: Every day | ORAL | Status: DC
  Administered 2024-04-10 – 2024-04-13 (×4): 0.5 mg via ORAL
  Filled 2024-04-10 (×4): qty 1

## 2024-04-10 MED ORDER — SENNOSIDES-DOCUSATE SODIUM 8.6-50 MG PO TABS: 1.0000 | ORAL_TABLET | Freq: Every evening | ORAL | Status: DC | PRN

## 2024-04-10 MED ORDER — ACETAMINOPHEN 500 MG PO TABS
1000.0000 mg | ORAL_TABLET | Freq: Three times a day (TID) | ORAL | Status: DC
  Administered 2024-04-11 – 2024-04-14 (×9): 1000 mg via ORAL
  Filled 2024-04-10 (×10): qty 2

## 2024-04-10 MED ORDER — ACETAMINOPHEN 650 MG RE SUPP: 650.0000 mg | Freq: Four times a day (QID) | RECTAL | Status: DC | PRN

## 2024-04-10 MED ORDER — LEVOTHYROXINE SODIUM 25 MCG PO TABS
137.0000 ug | ORAL_TABLET | Freq: Every day | ORAL | Status: DC
  Administered 2024-04-11 – 2024-04-14 (×4): 137 ug via ORAL
  Filled 2024-04-10 (×5): qty 1

## 2024-04-10 MED ORDER — FENTANYL CITRATE PF 50 MCG/ML IJ SOSY
25.0000 ug | PREFILLED_SYRINGE | INTRAMUSCULAR | Status: DC | PRN
  Administered 2024-04-10: 25 ug via INTRAVENOUS
  Filled 2024-04-10: qty 1

## 2024-04-10 MED ORDER — LACTATED RINGERS IV SOLN: INTRAVENOUS | Status: AC

## 2024-04-10 MED ORDER — ACETAMINOPHEN 325 MG PO TABS: 650.0000 mg | ORAL_TABLET | Freq: Four times a day (QID) | ORAL | Status: DC | PRN

## 2024-04-10 MED ORDER — ACETAMINOPHEN 325 MG PO TABS
650.0000 mg | ORAL_TABLET | Freq: Once | ORAL | Status: AC
  Administered 2024-04-10: 650 mg via ORAL
  Filled 2024-04-10: qty 2

## 2024-04-10 MED ORDER — LIDOCAINE 5 % EX PTCH
1.0000 | MEDICATED_PATCH | CUTANEOUS | Status: DC
  Administered 2024-04-10 – 2024-04-13 (×4): 1 via TRANSDERMAL
  Filled 2024-04-10 (×4): qty 1

## 2024-04-10 MED ORDER — HYDROMORPHONE HCL 1 MG/ML IJ SOLN
0.5000 mg | Freq: Four times a day (QID) | INTRAMUSCULAR | Status: DC | PRN
  Administered 2024-04-10: 0.5 mg via INTRAVENOUS
  Filled 2024-04-10: qty 0.5

## 2024-04-10 MED ORDER — ONDANSETRON HCL 4 MG/2ML IJ SOLN
4.0000 mg | Freq: Four times a day (QID) | INTRAMUSCULAR | Status: AC | PRN
Start: 2024-04-10 — End: ?

## 2024-04-10 NOTE — H&P (Incomplete)
 History and Physical  Danielle Proctor FMW:[id number] DOB: 1929-09-27 DOA: 04/10/2024  PCP: Clarice Nottingham, MD   Chief Complaint: Fall, tailbone pain  HPI: Danielle Proctor is a 89 y.o. female with medical history significant for osteoporosis, mild cognitive impairment, IBS, HTN and HLD who presented via EMS for evaluation of tailbone pain after a fall. Patient reports she was reaching out for her kitchen's doorknob when her hand slipped and she fell straight down on her buttocks.  States she was able to get up and call her friend to take her to the ER but the friend recommended she call 911 so she can be transported by the ambulance. She endorsed pain in her lower back and tailbone that are worse with movement. He denies any dizziness, vision changes, chest pain, weakness, abdominal pain or shortness of breath.  Reports she is quite independent with her ADLs and ambulates without assistive device.  ED Course: Initial vitals show patient afebrile with normal heart rate but hypertensive with SBP in the 150s to 170s. Initial labs significant for normal kidney function, glucose 175, normal CBC, ethanol level, magnesium and troponin. EKG shows sinus rhythm with occasional PVCs. CXR shows new calcified right lower lobe nodules but no active disease. CT head and CT cervical spine with no acute findings. CT pelvis show acute bilateral displaced and comminuted sacral ala fractures and likely hematoma formation in the presacral and pelvic sidewall. CT L-spine shows acute minimally displaced left L5 transverse process fractures. Pt received Tylenol 650 mg x 1, lidocaine patch and started on IV LR 75 cc/h. Orthopedic surgery was consulted for evaluation. [facility] was consulted for admission.   Review of Systems: Please see HPI for pertinent positives and negatives. A complete 10 system review of systems are otherwise negative.  Past Medical History:  Diagnosis Date   Atherosclerotic peripheral vascular disease  (HCC)    Diverticulosis    Hypercholesteremia    Hypertension    IBS (irritable bowel syndrome)    Osteoarthritis    Osteoporosis    Thyroid  disease    Past Surgical History:  Procedure Laterality Date   APPENDECTOMY     CHOLECYSTECTOMY     COLON SURGERY     Social History:  reports that she has never smoked. She has never used smokeless tobacco. She reports that she does not currently use alcohol. She reports that she does not use drugs.  Allergies  Allergen Reactions   Sulfa Antibiotics Nausea And Vomiting    Family History  Problem Relation Age of Onset   Heart attack Mother    Heart attack Father    Lung cancer Brother      Prior to Admission medications   Medication Sig Start Date End Date Taking? Authorizing Provider  acetaminophen (TYLENOL) 325 MG tablet Take 650 mg by mouth every 6 (six) hours as needed.    [provider]  ascorbic acid (VITAMIN C) 500 MG tablet Take 500 mg by mouth daily.    [provider]  aspirin EC 81 MG tablet Take 81 mg by mouth daily.    [provider]  b complex vitamins capsule Take 1 capsule by mouth daily.    [provider]  cholecalciferol (VITAMIN D) 1000 UNITS tablet Take 2,000 Units by mouth daily.    [provider]  ibuprofen (ADVIL) 100 MG tablet Take 100 mg by mouth every 6 (six) hours as needed for fever.    [provider]  levothyroxine (SYNTHROID, LEVOTHROID) 88 MCG  tablet Take 88 mcg by mouth daily before breakfast.    [provider]  LORazepam  (ATIVAN ) 0.5 MG tablet Take 0.5 tablets (0.25 mg total) by mouth every 8 (eight) hours as needed for anxiety. 07/29/21   Raford Lenis, MD  Melatonin 3 MG CAPS Take 1 Capful by mouth as needed.    [provider]    Physical Exam: BP (!) 176/95   Pulse 66   Temp 97.9 F (36.6 C) (Oral)   Resp 18   Ht 4' 11 (1.499 m)   Wt 48 kg   SpO2 94%   BMI 21.37 kg/m  General: Pleasant, well-appearing elderly  woman laying in bed. No acute distress. HEENT: [place]/AT. Anicteric sclera CV: RRR. No murmurs, rubs, or gallops. No LE edema Pulmonary: Lungs CTAB. Normal effort. No wheezing or rales. Abdominal: Soft, nontender, nondistended. Normal bowel sounds. MSK: Mild tenderness to palpation of the lower back and sacral area.  Limited ROM of the back due to pain. Neurovascular intact distally Skin: Warm and dry. No obvious rash or lesions. Neuro: A&Ox3. Moves all extremities. Normal sensation to light touch. No focal deficit. Psych: Normal mood and affect          Labs on Admission:  Basic Metabolic Panel: Recent Labs  Lab 04/10/24 1715  NA 140  K 3.6  CL 104  CO2 24  GLUCOSE 175*  BUN 25*  CREATININE 0.90  CALCIUM 9.8  MG 2.3   Liver Function Tests: Recent Labs  Lab 04/10/24 1715  AST 29  ALT 25  ALKPHOS 24*  BILITOT 0.4  PROT 6.7  ALBUMIN 4.0   No results for input(s): LIPASE, AMYLASE in the last 168 hours. No results for input(s): AMMONIA in the last 168 hours. CBC: Recent Labs  Lab 04/10/24 1715  WBC 7.9  HGB 12.6  HCT 37.3  MCV 92.1  PLT 249   Cardiac Enzymes: No results for input(s): CKTOTAL, CKMB, CKMBINDEX, TROPONINI in the last 168 hours. BNP (last 3 results) No results for input(s): BNP in the last 8760 hours.  ProBNP (last 3 results) No results for input(s): PROBNP in the last 8760 hours.  CBG: No results for input(s): GLUCAP in the last 168 hours.  Radiological Exams on Admission: CT PELVIS WO CONTRAST Result Date: 04/10/2024 CLINICAL DATA:  These results were communicated via [email or web address] application on 04/10/2024 at 7:52 pm. EXAM: CT PELVIS WITHOUT CONTRAST TECHNIQUE: Multidetector CT imaging of the pelvis was performed following the standard protocol without intravenous contrast. RADIATION DOSE REDUCTION: This exam was performed according to the departmental dose-optimization program which includes automated exposure control, adjustment of  the mA and/or kV according to patient size and/or use of iterative reconstruction technique. COMPARISON:  CT lumbar spine 04/10/2024. FINDINGS: Urinary Tract:  No abnormality visualized. Bowel: Bowel resection. Colonic diverticulosis. Unremarkable visualized pelvic bowel loops. Vascular/Lymphatic: Atherosclerotic plaque. No pathologically enlarged lymph nodes. No significant vascular abnormality seen. Reproductive:  No mass or other significant abnormality Other: Marked bilateral, left greater than right, presacral and pelvic sidewall fat stranding and hematoma formation. Associated left external iliac vasculature injury not excluded with markedly limited evaluation on this noncontrast study (4:44). No intraperitoneal free fluid. No intraperitoneal free gas. No organized fluid collection. Musculoskeletal: Acute bilateral displaced and comminuted sacral ala fractures extending to the sacroiliac joints as well as S1 and S2 osseous neural foramina. No acute displaced fracture or dislocation of either hips. Slightly asymmetric left iliopsoas muscles with underlying hematoma not excluded. Please see separately dictated CT  lumbar spine 04/10/2024. IMPRESSION: 1. Acute bilateral displaced and comminuted sacral ala fractures extending to the sacroiliac joints as well as S1 and S2 osseous neural foramina. 2. Marked bilateral, left greater than right, presacral and pelvic sidewall fat stranding and hematoma formation. Concern for possible underlying left external iliac vasculature injury-markedly limited evaluation on this noncontrast study. 3.  Aortic Atherosclerosis (ICD10-I70.0). 4. Slightly asymmetric left iliopsoas muscles with underlying hematoma not excluded. 5. Please see separately dictated CT lumbar spine 04/10/2024. Electronically Signed   By: Morgane  Naveau M.D.   On: 04/10/2024 20:01   CT Lumbar Spine Wo Contrast Result Date: 04/10/2024 CLINICAL DATA:  Back trauma, no prior imaging (Age >= 16y) Pt reports a  fall earlier today in her kitchen, C/O left hip pain. EXAM: CT LUMBAR SPINE WITHOUT CONTRAST TECHNIQUE: Multidetector CT imaging of the lumbar spine was performed without intravenous contrast administration. Multiplanar CT image reconstructions were also generated. RADIATION DOSE REDUCTION: This exam was performed according to the departmental dose-optimization program which includes automated exposure control, adjustment of the mA and/or kV according to patient size and/or use of iterative reconstruction technique. COMPARISON:  CT abdomen pelvis 01/13/2022, CT pelvis 04/10/2024 FINDINGS: Segmentation: 5 lumbar type vertebrae. Alignment: Grade 1 anterolisthesis of L4 on L5 and L5 on S1. Vertebrae: Diffusely decreased bone density. Multilevel mild degenerative changes of the spine. Multilevel disc bulge. Acute minimally displaced left L5 transverse process fractures. Similar-appearing chronic mild L5 vertebral body height loss. No aggressive appearing focal pathologic process. Paraspinal and other soft tissues: Negative. Disc levels: Maintained. Other: Sacral fracture noted, please see separately dictated CT pelvis 04/10/2024. Atherosclerotic plaque of the aorta. Fat stranding along the pelvic sidewalls partially visualized. IMPRESSION: 1. Acute minimally displaced left L5 transverse process fractures. 2. Sacral fracture noted, please see separately dictated CT pelvis 04/10/2024. Electronically Signed   By: Morgane  Naveau M.D.   On: 04/10/2024 19:52   CT HEAD WO CONTRAST Result Date: 04/10/2024 CLINICAL DATA:  Head trauma, moderate-severe; Polytrauma, blunt EXAM: CT HEAD WITHOUT CONTRAST CT CERVICAL SPINE WITHOUT CONTRAST TECHNIQUE: Multidetector CT imaging of the head and cervical spine was performed following the standard protocol without intravenous contrast. Multiplanar CT image reconstructions of the cervical spine were also generated. RADIATION DOSE REDUCTION: This exam was performed according to the  departmental dose-optimization program which includes automated exposure control, adjustment of the mA and/or kV according to patient size and/or use of iterative reconstruction technique. COMPARISON:  None Available. FINDINGS: CT HEAD FINDINGS Brain: No evidence of large-territorial acute infarction. No parenchymal hemorrhage. No mass lesion. No extra-axial collection. No mass effect or midline shift. No hydrocephalus. Basilar cisterns are patent. Vascular: No hyperdense vessel. Atherosclerotic calcifications are present within the cavernous internal carotid and vertebral arteries. Skull: No acute fracture or focal lesion. Sinuses/Orbits: Paranasal sinuses and mastoid air cells are clear. The orbits are unremarkable. Other: None. CT CERVICAL SPINE FINDINGS Alignment: Grade 1 anterolisthesis of C2 on C3, C3 on C4, C4 on C5, C5 on C6. Skull base and vertebrae: Multilevel degenerative change of the spine with severe osseous neural foraminal stenosis at the C3-C4 level on the left. No acute fracture. No aggressive appearing focal osseous lesion or focal pathologic process. Soft tissues and spinal canal: No prevertebral fluid or swelling. No visible canal hematoma. Upper chest: Unremarkable. Other: None. IMPRESSION: 1. No acute intracranial abnormality. 2. No acute displaced fracture or traumatic listhesis of the cervical spine. Electronically Signed   By: Morgane  Naveau M.D.   On: 04/10/2024 19:46  CT CERVICAL SPINE WO CONTRAST Result Date: 04/10/2024 CLINICAL DATA:  Head trauma, moderate-severe; Polytrauma, blunt EXAM: CT HEAD WITHOUT CONTRAST CT CERVICAL SPINE WITHOUT CONTRAST TECHNIQUE: Multidetector CT imaging of the head and cervical spine was performed following the standard protocol without intravenous contrast. Multiplanar CT image reconstructions of the cervical spine were also generated. RADIATION DOSE REDUCTION: This exam was performed according to the departmental dose-optimization program which  includes automated exposure control, adjustment of the mA and/or kV according to patient size and/or use of iterative reconstruction technique. COMPARISON:  None Available. FINDINGS: CT HEAD FINDINGS Brain: No evidence of large-territorial acute infarction. No parenchymal hemorrhage. No mass lesion. No extra-axial collection. No mass effect or midline shift. No hydrocephalus. Basilar cisterns are patent. Vascular: No hyperdense vessel. Atherosclerotic calcifications are present within the cavernous internal carotid and vertebral arteries. Skull: No acute fracture or focal lesion. Sinuses/Orbits: Paranasal sinuses and mastoid air cells are clear. The orbits are unremarkable. Other: None. CT CERVICAL SPINE FINDINGS Alignment: Grade 1 anterolisthesis of C2 on C3, C3 on C4, C4 on C5, C5 on C6. Skull base and vertebrae: Multilevel degenerative change of the spine with severe osseous neural foraminal stenosis at the C3-C4 level on the left. No acute fracture. No aggressive appearing focal osseous lesion or focal pathologic process. Soft tissues and spinal canal: No prevertebral fluid or swelling. No visible canal hematoma. Upper chest: Unremarkable. Other: None. IMPRESSION: 1. No acute intracranial abnormality. 2. No acute displaced fracture or traumatic listhesis of the cervical spine. Electronically Signed   By: Morgane  Naveau M.D.   On: 04/10/2024 19:46   DG Pelvis Portable Result Date: 04/10/2024 CLINICAL DATA:  Trauma, fall. EXAM: PORTABLE PELVIS 1-2 VIEWS COMPARISON:  None Available. FINDINGS: There is no evidence of pelvic fracture or diastasis. No pelvic bone lesions are seen. Surgical staple line is seen in the right abdomen. IMPRESSION: Negative. Electronically Signed   By: Greig Pique M.D.   On: 04/10/2024 18:01   DG Chest Port 1 View Result Date: 04/10/2024 CLINICAL DATA:  Trauma EXAM: PORTABLE CHEST 1 VIEW COMPARISON:  Chest x-ray 06/01/2022 FINDINGS: The heart size and mediastinal contours are  within normal limits. There are calcified nodules in the right lower lung, new from prior. The lungs are otherwise clear. There is no pleural effusion or pneumothorax. The visualized skeletal structures are unremarkable. IMPRESSION: 1. No active disease. 2. New calcified right lower lobe nodules. Electronically Signed   By: Greig Pique M.D.   On: 04/10/2024 17:55   Assessment/Plan Danielle Proctor is a 89 y.o. female with medical history significant for osteoporosis, mild cognitive impairment, IBS, HTN and HLD who presented via EMS for evaluation of tailbone pain after a fall and admitted for bilateral sacral fractures.  # Mechanical fall # Sacral fracture # L5 transverse process fractures - Elderly patient with hx of osteoporosis presented after mechanical fall at home - On exam, patient with lower back and sacral pain - CT of the pelvis shows acute bilateral displaced and communicated sacral ala fractures extending to the sacroiliac joints, S1 and S2 osseous neural foramina - CT of the L-spine also shows acute minimally displaced left L5 transverse process fractures - Orthopedic surgery, Dr. Jennye, consulted, plan to see in the morning - Multimodal pain control with scheduled Tylenol, lidocaine patch and as needed oxycodone and IV Dilaudid - N.p.o. at midnight - Fall precautions  # ?Pelvic hematoma - CT of the pelvis also showed marked bilateral, L > R, presacral and pelvic sidewall  fat stranding and hematoma formation, possible underlying left external iliac vasculature injury and lightly asymmetric left iliopsoas muscle with underlying hematoma not excluded - Hemoglobin stable at 12.6 - Follow-up CTA aortobifemoral with and without contrast  # Pulmonary nodules - CXR on admission shows new calcified right lower lobe nodules.  - Follow-up CT for further evaluation  # Hypothyroidism - TSH normal at 0.65 49-month ago - Continue on Synthroid  # Vitamin B-12 deficiency, mild -  Patient with borderline low vitamin B12 of 380 4 weeks ago on labs ordered by PCP - Patient advised to take OTC vitamin B-12 supplements - Start vitamin B12 1000 mcg daily  # Osteoporosis - Check vitamin D levels  # Insomnia - Continue Ativan  at bedtime   DVT prophylaxis: SCDs    Code Status: Limited: Do not attempt resuscitation (DNR) -DNR-LIMITED -Do Not Intubate/DNI   Consults called: Orthopedic surgery  Family Communication: No family at bedside  Severity of Illness: The appropriate patient status for this patient is INPATIENT. Inpatient status is judged to be reasonable and necessary in order to provide the required intensity of service to ensure the patient's safety. The patient's presenting symptoms, physical exam findings, and initial radiographic and laboratory data in the context of their chronic comorbidities is felt to place them at high risk for further clinical deterioration. Furthermore, it is not anticipated that the patient will be medically stable for discharge from the hospital within 2 midnights of admission.   * I certify that at the point of admission it is my clinical judgment that the patient will require inpatient hospital care spanning beyond 2 midnights from the point of admission due to high intensity of service, high risk for further deterioration and high frequency of surveillance required.*  Level of care: Telemetry   This record has been created using Conservation officer, historic buildings. Errors have been sought and corrected, but may not always be located. Such creation errors do not reflect on the standard of care.   Lou Claretta HERO, MD 04/10/2024, 9:52 PM [facility] Pager: [phone] Isaiah 41:10   If 7PM-7AM, please contact night-coverage [email or web address] Password [email or web address]

## 2024-04-10 NOTE — ED Notes (Signed)
 Patient transported to CT

## 2024-04-10 NOTE — ED Triage Notes (Addendum)
 Patient BIB [facility] from home. Patient was sleeping and stood up too fast and fell hitting her head on the door then fell to her buttocks on the floor. Denies LOC. No thinners. Has pain in her tailbone area.   EMS 20G left ac 100mcg fentanyl 

## 2024-04-10 NOTE — ED Provider Notes (Signed)
 [place] [facility] AT [facility] Provider Note   CSN: [id number] Arrival date & time: 04/10/24  1658     Patient presents with: Danielle Proctor DELESHA POHLMAN is a 89 y.o. female.   HPI Patient presents after fall.  Medical history includes HTN, HLD, IBS, osteoporosis, mild cognitive impairment.  She lives independently.  Earlier today, she was in her normal state of health.  Prior to arrival, she got up from her chair.  She lost her balance and fell forward.  She states that she initially struck her head against a door and then fell backwards, landing on her buttocks.  She has since had pain in the sacral area.  With EMS, she was able to stand with assistance.  As she was walking to the stretcher, she had worsened pain in her sacral area.  She had a near syncopal episode.  Currently, she endorses ongoing pain in her sacral area but denies any other areas of discomfort.  She is not on any blood thinners.    Prior to Admission medications   Medication Sig Start Date End Date Taking? Authorizing Provider  acetaminophen (TYLENOL) 325 MG tablet Take 325-650 mg by mouth every 6 (six) hours as needed for mild pain (pain score 1-3) or headache.   Yes [provider]  levothyroxine (SYNTHROID) 137 MCG tablet Take 137 mcg by mouth daily before breakfast.   Yes [provider]  LORazepam  (ATIVAN ) 0.5 MG tablet Take 0.5 tablets (0.25 mg total) by mouth every 8 (eight) hours as needed for anxiety. Patient taking differently: Take 0.5 mg by mouth at bedtime. 07/29/21  Yes Raford Lenis, MD    Allergies: Other, Alendronate, Dicyclomine , Erythromycin, Ezetimibe, Hydroxyzine, Hyoscyamine , Prednisone , Promethazine, Statins, and Sulfa antibiotics    Review of Systems  Musculoskeletal:  Positive for back pain.  All other systems reviewed and are negative.   Updated Vital Signs BP (!) 176/95   Pulse 66   Temp 97.9 F (36.6 C) (Oral)   Resp 18   Ht 4' 11 (1.499  m)   Wt 48 kg   SpO2 94%   BMI 21.37 kg/m   Physical Exam Vitals and nursing note reviewed.  Constitutional:      General: She is not in acute distress.    Appearance: Normal appearance. She is well-developed. She is not ill-appearing, toxic-appearing or diaphoretic.  HENT:     Head: Normocephalic and atraumatic.     Right Ear: External ear normal.     Left Ear: External ear normal.     Nose: Nose normal.     Mouth/Throat:     Mouth: Mucous membranes are moist.   Eyes:     Extraocular Movements: Extraocular movements intact.     Conjunctiva/sclera: Conjunctivae normal.    Cardiovascular:     Rate and Rhythm: Normal rate and regular rhythm.  Pulmonary:     Effort: Pulmonary effort is normal. No respiratory distress.  Abdominal:     General: There is no distension.     Palpations: Abdomen is soft.     Tenderness: There is no abdominal tenderness.   Musculoskeletal:        General: No swelling or deformity. Normal range of motion.     Cervical back: Normal range of motion and neck supple.   Skin:    General: Skin is warm and dry.     Coloration: Skin is not jaundiced or pale.   Neurological:     General: No focal  deficit present.     Mental Status: She is alert and oriented to person, place, and time.     Cranial Nerves: No cranial nerve deficit.     Sensory: No sensory deficit.     Motor: No weakness.     Coordination: Coordination normal.   Psychiatric:        Mood and Affect: Mood normal.        Behavior: Behavior normal.     (all labs ordered are listed, but only abnormal results are displayed) Labs Reviewed  COMPREHENSIVE METABOLIC PANEL WITH GFR - Abnormal; Notable for the following components:      Result Value   Glucose, Bld 175 (*)    BUN 25 (*)    Alkaline Phosphatase 24 (*)    GFR, Estimated 60 (*)    All other components within normal limits  CBC  ETHANOL  MAGNESIUM  URINALYSIS, ROUTINE W REFLEX MICROSCOPIC  TROPONIN I (HIGH SENSITIVITY)     EKG: EKG Interpretation Date/Time:  Wednesday April 10 2024 17:19:46 EDT Ventricular Rate:  67 PR Interval:  177 QRS Duration:  91 QT Interval:  400 QTC Calculation: 423 R Axis:   68  Text Interpretation: Sinus rhythm Ventricular premature complex Confirmed by Melvenia Motto [phone]) on 04/10/2024 5:51:53 PM  Radiology: CT PELVIS WO CONTRAST Result Date: 04/10/2024 CLINICAL DATA:  These results were communicated via [email or web address] application on 04/10/2024 at 7:52 pm. EXAM: CT PELVIS WITHOUT CONTRAST TECHNIQUE: Multidetector CT imaging of the pelvis was performed following the standard protocol without intravenous contrast. RADIATION DOSE REDUCTION: This exam was performed according to the departmental dose-optimization program which includes automated exposure control, adjustment of the mA and/or kV according to patient size and/or use of iterative reconstruction technique. COMPARISON:  CT lumbar spine 04/10/2024. FINDINGS: Urinary Tract:  No abnormality visualized. Bowel: Bowel resection. Colonic diverticulosis. Unremarkable visualized pelvic bowel loops. Vascular/Lymphatic: Atherosclerotic plaque. No pathologically enlarged lymph nodes. No significant vascular abnormality seen. Reproductive:  No mass or other significant abnormality Other: Marked bilateral, left greater than right, presacral and pelvic sidewall fat stranding and hematoma formation. Associated left external iliac vasculature injury not excluded with markedly limited evaluation on this noncontrast study (4:44). No intraperitoneal free fluid. No intraperitoneal free gas. No organized fluid collection. Musculoskeletal: Acute bilateral displaced and comminuted sacral ala fractures extending to the sacroiliac joints as well as S1 and S2 osseous neural foramina. No acute displaced fracture or dislocation of either hips. Slightly asymmetric left iliopsoas muscles with underlying hematoma not excluded. Please see separately dictated CT lumbar spine  04/10/2024. IMPRESSION: 1. Acute bilateral displaced and comminuted sacral ala fractures extending to the sacroiliac joints as well as S1 and S2 osseous neural foramina. 2. Marked bilateral, left greater than right, presacral and pelvic sidewall fat stranding and hematoma formation. Concern for possible underlying left external iliac vasculature injury-markedly limited evaluation on this noncontrast study. 3.  Aortic Atherosclerosis (ICD10-I70.0). 4. Slightly asymmetric left iliopsoas muscles with underlying hematoma not excluded. 5. Please see separately dictated CT lumbar spine 04/10/2024. Electronically Signed   By: Morgane  Naveau M.D.   On: 04/10/2024 20:01   CT Lumbar Spine Wo Contrast Result Date: 04/10/2024 CLINICAL DATA:  Back trauma, no prior imaging (Age >= 16y) Pt reports a fall earlier today in her kitchen, C/O left hip pain. EXAM: CT LUMBAR SPINE WITHOUT CONTRAST TECHNIQUE: Multidetector CT imaging of the lumbar spine was performed without intravenous contrast administration. Multiplanar CT image reconstructions were also generated. RADIATION DOSE REDUCTION: This exam was  performed according to the departmental dose-optimization program which includes automated exposure control, adjustment of the mA and/or kV according to patient size and/or use of iterative reconstruction technique. COMPARISON:  CT abdomen pelvis 01/13/2022, CT pelvis 04/10/2024 FINDINGS: Segmentation: 5 lumbar type vertebrae. Alignment: Grade 1 anterolisthesis of L4 on L5 and L5 on S1. Vertebrae: Diffusely decreased bone density. Multilevel mild degenerative changes of the spine. Multilevel disc bulge. Acute minimally displaced left L5 transverse process fractures. Similar-appearing chronic mild L5 vertebral body height loss. No aggressive appearing focal pathologic process. Paraspinal and other soft tissues: Negative. Disc levels: Maintained. Other: Sacral fracture noted, please see separately dictated CT pelvis 04/10/2024.  Atherosclerotic plaque of the aorta. Fat stranding along the pelvic sidewalls partially visualized. IMPRESSION: 1. Acute minimally displaced left L5 transverse process fractures. 2. Sacral fracture noted, please see separately dictated CT pelvis 04/10/2024. Electronically Signed   By: Morgane  Naveau M.D.   On: 04/10/2024 19:52   CT HEAD WO CONTRAST Result Date: 04/10/2024 CLINICAL DATA:  Head trauma, moderate-severe; Polytrauma, blunt EXAM: CT HEAD WITHOUT CONTRAST CT CERVICAL SPINE WITHOUT CONTRAST TECHNIQUE: Multidetector CT imaging of the head and cervical spine was performed following the standard protocol without intravenous contrast. Multiplanar CT image reconstructions of the cervical spine were also generated. RADIATION DOSE REDUCTION: This exam was performed according to the departmental dose-optimization program which includes automated exposure control, adjustment of the mA and/or kV according to patient size and/or use of iterative reconstruction technique. COMPARISON:  None Available. FINDINGS: CT HEAD FINDINGS Brain: No evidence of large-territorial acute infarction. No parenchymal hemorrhage. No mass lesion. No extra-axial collection. No mass effect or midline shift. No hydrocephalus. Basilar cisterns are patent. Vascular: No hyperdense vessel. Atherosclerotic calcifications are present within the cavernous internal carotid and vertebral arteries. Skull: No acute fracture or focal lesion. Sinuses/Orbits: Paranasal sinuses and mastoid air cells are clear. The orbits are unremarkable. Other: None. CT CERVICAL SPINE FINDINGS Alignment: Grade 1 anterolisthesis of C2 on C3, C3 on C4, C4 on C5, C5 on C6. Skull base and vertebrae: Multilevel degenerative change of the spine with severe osseous neural foraminal stenosis at the C3-C4 level on the left. No acute fracture. No aggressive appearing focal osseous lesion or focal pathologic process. Soft tissues and spinal canal: No prevertebral fluid or  swelling. No visible canal hematoma. Upper chest: Unremarkable. Other: None. IMPRESSION: 1. No acute intracranial abnormality. 2. No acute displaced fracture or traumatic listhesis of the cervical spine. Electronically Signed   By: Morgane  Naveau M.D.   On: 04/10/2024 19:46   CT CERVICAL SPINE WO CONTRAST Result Date: 04/10/2024 CLINICAL DATA:  Head trauma, moderate-severe; Polytrauma, blunt EXAM: CT HEAD WITHOUT CONTRAST CT CERVICAL SPINE WITHOUT CONTRAST TECHNIQUE: Multidetector CT imaging of the head and cervical spine was performed following the standard protocol without intravenous contrast. Multiplanar CT image reconstructions of the cervical spine were also generated. RADIATION DOSE REDUCTION: This exam was performed according to the departmental dose-optimization program which includes automated exposure control, adjustment of the mA and/or kV according to patient size and/or use of iterative reconstruction technique. COMPARISON:  None Available. FINDINGS: CT HEAD FINDINGS Brain: No evidence of large-territorial acute infarction. No parenchymal hemorrhage. No mass lesion. No extra-axial collection. No mass effect or midline shift. No hydrocephalus. Basilar cisterns are patent. Vascular: No hyperdense vessel. Atherosclerotic calcifications are present within the cavernous internal carotid and vertebral arteries. Skull: No acute fracture or focal lesion. Sinuses/Orbits: Paranasal sinuses and mastoid air cells are clear. The orbits are unremarkable. Other:  None. CT CERVICAL SPINE FINDINGS Alignment: Grade 1 anterolisthesis of C2 on C3, C3 on C4, C4 on C5, C5 on C6. Skull base and vertebrae: Multilevel degenerative change of the spine with severe osseous neural foraminal stenosis at the C3-C4 level on the left. No acute fracture. No aggressive appearing focal osseous lesion or focal pathologic process. Soft tissues and spinal canal: No prevertebral fluid or swelling. No visible canal hematoma. Upper chest:  Unremarkable. Other: None. IMPRESSION: 1. No acute intracranial abnormality. 2. No acute displaced fracture or traumatic listhesis of the cervical spine. Electronically Signed   By: Morgane  Naveau M.D.   On: 04/10/2024 19:46   DG Pelvis Portable Result Date: 04/10/2024 CLINICAL DATA:  Trauma, fall. EXAM: PORTABLE PELVIS 1-2 VIEWS COMPARISON:  None Available. FINDINGS: There is no evidence of pelvic fracture or diastasis. No pelvic bone lesions are seen. Surgical staple line is seen in the right abdomen. IMPRESSION: Negative. Electronically Signed   By: Greig Pique M.D.   On: 04/10/2024 18:01   DG Chest Port 1 View Result Date: 04/10/2024 CLINICAL DATA:  Trauma EXAM: PORTABLE CHEST 1 VIEW COMPARISON:  Chest x-ray 06/01/2022 FINDINGS: The heart size and mediastinal contours are within normal limits. There are calcified nodules in the right lower lung, new from prior. The lungs are otherwise clear. There is no pleural effusion or pneumothorax. The visualized skeletal structures are unremarkable. IMPRESSION: 1. No active disease. 2. New calcified right lower lobe nodules. Electronically Signed   By: Greig Pique M.D.   On: 04/10/2024 17:55     Procedures   Medications Ordered in the ED  lidocaine (LIDODERM) 5 % 1 patch (1 patch Transdermal Patch Applied 04/10/24 1726)  lactated ringers infusion ( Intravenous New Bag/Given 04/10/24 2157)  senna-docusate (Senokot-S) tablet 1 tablet (has no administration in time range)  ondansetron  (ZOFRAN ) tablet 4 mg (has no administration in time range)    Or  ondansetron  (ZOFRAN ) injection 4 mg (has no administration in time range)  HYDROmorphone (DILAUDID) injection 0.5 mg (has no administration in time range)  acetaminophen (TYLENOL) tablet 1,000 mg (has no administration in time range)  levothyroxine (SYNTHROID) tablet 137 mcg (has no administration in time range)  LORazepam  (ATIVAN ) tablet 0.5 mg (has no administration in time range)  acetaminophen  (TYLENOL) tablet 650 mg (650 mg Oral Given 04/10/24 1726)                                    Medical Decision Making Amount and/or Complexity of Data Reviewed Labs: ordered. Radiology: ordered.  Risk OTC drugs. Prescription drug management. Decision regarding hospitalization.   This patient presents to the ED for concern of fall, this involves an extensive number of treatment options, and is a complaint that carries with it a high risk of complications and morbidity.  The differential diagnosis includes acute injuries   Co morbidities / Chronic conditions that complicate the patient evaluation  HTN, HLD, IBS, osteoporosis, mild cognitive impairment   Additional history obtained:  Additional history obtained from EMR External records from outside source obtained and reviewed including EMS   Lab Tests:  I Ordered, and personally interpreted labs.  The pertinent results include: Normal hemoglobin, no leukocytosis, normal troponin   Imaging Studies ordered:  I ordered imaging studies including x-ray of chest and pelvis, CT of head, cervical spine, pelvis, lumbar spine I independently visualized and interpreted imaging which showed bilateral comminuted and displaced  sacral alar fractures I agree with the radiologist interpretation   Cardiac Monitoring: / EKG:  The patient was maintained on a cardiac monitor.  I personally viewed and interpreted the cardiac monitored which showed an underlying rhythm of: Sinus rhythm   Problem List / ED Course / Critical interventions / Medication management  Patient presents after a ground-level mechanical fall at home.  During this fall, she did strike her head and then landed on her buttocks.  She has since had pain in her sacral area.  On arrival, she is alert and oriented.  There is no scalp or forehead evidence of trauma.  She has no focal neurologic deficits.  She is able to flex her hips.  She does have worsened pain in her lower  back, particular with left hip flexion.  Multimodal pain control was ordered.  Workup was initiated.  Patient's initial imaging studies notable for bilateral comminuted and displaced sacral ala fractures.  There was concern of possible vascular injury.  CTA with runoff was ordered.  I spoke with orthopedist, Dr. Jennye, who recommends hospitalist admission and n.p.o. at midnight.  As needed pain medications ordered.  Patient to be admitted for further management. I ordered medication including Tylenol, lidocaine patch, fentanyl  for analgesia; IV fluids for hydration Reevaluation of the patient after these medicines showed that the patient improved I have reviewed the patients home medicines and have made adjustments as needed   Consultations Obtained:  I requested consultation with the orthopedist, McBane,  and discussed lab and imaging findings as well as pertinent plan - they recommend: Admission to hospitalist, n.p.o. at midnight   Social Determinants of Health:  Lives independently     Final diagnoses:  Fall, initial encounter  Closed fracture of sacrum, unspecified portion of sacrum, initial encounter [facility])    ED Discharge Orders     None          Melvenia Motto, MD 04/10/24 2237

## 2024-04-10 NOTE — ED Notes (Signed)
 Patient said she cannot provide urine sample right now but knows to use call bell when able.

## 2024-04-11 ENCOUNTER — Encounter (HOSPITAL_COMMUNITY): Payer: Self-pay | Admitting: Student

## 2024-04-11 ENCOUNTER — Inpatient Hospital Stay (HOSPITAL_COMMUNITY)

## 2024-04-11 DIAGNOSIS — S32009A Unspecified fracture of unspecified lumbar vertebra, initial encounter for closed fracture: Secondary | ICD-10-CM

## 2024-04-11 DIAGNOSIS — S3210XA Unspecified fracture of sacrum, initial encounter for closed fracture: Secondary | ICD-10-CM | POA: Diagnosis not present

## 2024-04-11 DIAGNOSIS — W19XXXA Unspecified fall, initial encounter: Secondary | ICD-10-CM

## 2024-04-11 LAB — CBC
HCT: 35 % — ABNORMAL LOW (ref 36.0–46.0)
Hemoglobin: 11.8 g/dL — ABNORMAL LOW (ref 12.0–15.0)
MCH: 31.1 pg (ref 26.0–34.0)
MCHC: 33.7 g/dL (ref 30.0–36.0)
MCV: 92.1 fL (ref 80.0–100.0)
Platelets: 172 10*3/uL (ref 150–400)
RBC: 3.8 MIL/uL — ABNORMAL LOW (ref 3.87–5.11)
RDW: 12.5 % (ref 11.5–15.5)
WBC: 13.7 10*3/uL — ABNORMAL HIGH (ref 4.0–10.5)
nRBC: 0 % (ref 0.0–0.2)

## 2024-04-11 LAB — BASIC METABOLIC PANEL WITH GFR
Anion gap: 8 (ref 5–15)
BUN: 23 mg/dL (ref 8–23)
CO2: 24 mmol/L (ref 22–32)
Calcium: 9.1 mg/dL (ref 8.9–10.3)
Chloride: 103 mmol/L (ref 98–111)
Creatinine, Ser: 0.86 mg/dL (ref 0.44–1.00)
GFR, Estimated: >60 mL/min (ref >60)
Glucose, Bld: 147 mg/dL — ABNORMAL HIGH (ref 70–99)
Potassium: 4.6 mmol/L (ref 3.5–5.1)
Sodium: 135 mmol/L (ref 135–145)

## 2024-04-11 LAB — VITAMIN D 25 HYDROXY (VIT D DEFICIENCY, FRACTURES): Vit D, 25-Hydroxy: 46.42 ng/mL (ref 30–100)

## 2024-04-11 LAB — SURGICAL PCR SCREEN
MRSA, PCR: NEGATIVE
Staphylococcus aureus: NEGATIVE

## 2024-04-11 MED ORDER — OXYCODONE HCL 5 MG PO TABS
5.0000 mg | ORAL_TABLET | Freq: Four times a day (QID) | ORAL | Status: DC | PRN
  Administered 2024-04-11 – 2024-04-12 (×4): 5 mg via ORAL
  Filled 2024-04-11 (×5): qty 1

## 2024-04-11 MED ORDER — VITAMIN B-12 1000 MCG PO TABS
1000.0000 ug | ORAL_TABLET | Freq: Every day | ORAL | Status: DC
  Administered 2024-04-11 – 2024-04-14 (×4): 1000 ug via ORAL
  Filled 2024-04-11 (×4): qty 1

## 2024-04-11 MED ORDER — CEFAZOLIN SODIUM-DEXTROSE 2-4 GM/100ML-% IV SOLN
2.0000 g | INTRAVENOUS | Status: AC
  Administered 2024-04-12: 2 g via INTRAVENOUS
  Filled 2024-04-11: qty 100

## 2024-04-11 MED ORDER — POVIDONE-IODINE 10 % EX SWAB
2.0000 | Freq: Once | CUTANEOUS | Status: AC
Start: 2024-04-12 — End: 2024-04-12
  Administered 2024-04-12: 2 via TOPICAL

## 2024-04-11 MED ORDER — IOHEXOL 350 MG/ML SOLN
100.0000 mL | Freq: Once | INTRAVENOUS | Status: AC | PRN
  Administered 2024-04-11: 100 mL via INTRAVENOUS

## 2024-04-11 MED ORDER — MUPIROCIN 2 % EX OINT
1.0000 | TOPICAL_OINTMENT | Freq: Two times a day (BID) | CUTANEOUS | Status: DC
Start: 2024-04-12 — End: 2024-04-16
  Administered 2024-04-12 – 2024-04-14 (×6): 1 via NASAL
  Filled 2024-04-11 (×2): qty 22

## 2024-04-11 MED ORDER — CHLORHEXIDINE GLUCONATE 4 % EX SOLN
60.0000 mL | Freq: Once | CUTANEOUS | Status: AC
  Administered 2024-04-12: 4 via TOPICAL
  Filled 2024-04-11: qty 60

## 2024-04-11 MED ORDER — HYDROMORPHONE HCL 1 MG/ML IJ SOLN
0.5000 mg | Freq: Four times a day (QID) | INTRAMUSCULAR | Status: DC | PRN
  Administered 2024-04-11 – 2024-04-12 (×2): 0.5 mg via INTRAVENOUS
  Filled 2024-04-11 (×3): qty 0.5

## 2024-04-11 MED ORDER — IOHEXOL 300 MG/ML  SOLN: 100.0000 mL | Freq: Once | INTRAMUSCULAR | Status: DC | PRN

## 2024-04-11 NOTE — Consult Note (Signed)
 Reason for Consult:Sacral fx Referring Physician: Ivonne Mustache Time called: [id number] Time at bedside: 1135   Danielle Proctor is an 89 y.o. female.  HPI: Danielle Proctor was about to go through a door and had her hand on the panel. She's unsure what happened but fell and landed on her butt/back. She had immediate left hip and back pain. She managed to get help and got up. Her friend convinced her to come to the ED via ambulance and workup showed a left sacral fx and orthopedic surgery was consulted. She lives at home (just took in the daughter of a friend) and does not use any assistive devices to ambulate.  Past Medical History:  Diagnosis Date   Atherosclerotic peripheral vascular disease (HCC)    Diverticulosis    Hypercholesteremia    Hypertension    IBS (irritable bowel syndrome)    Osteoarthritis    Osteoporosis    Thyroid  disease     Past Surgical History:  Procedure Laterality Date   APPENDECTOMY     CHOLECYSTECTOMY     COLON SURGERY      Family History  Problem Relation Age of Onset   Heart attack Mother    Heart attack Father    Lung cancer Brother     Social History:  reports that she has never smoked. She has never used smokeless tobacco. She reports that she does not currently use alcohol. She reports that she does not use drugs.  Allergies:  Allergies  Allergen Reactions   Other Other (See Comments)    NO NUTS, SEEDED FOODS, PEANUTS, ETC..  Mepergan Plus Phenergan = Blacked out   Alendronate Other (See Comments)    GI upset   Dicyclomine  Other (See Comments)    Dizziness   Erythromycin Diarrhea   Ezetimibe Other (See Comments)    Hands ached   Hydroxyzine Other (See Comments)    Excessive drowsiness   Hyoscyamine  Other (See Comments)    Weak   Prednisone  Other (See Comments)    Dizziness    Promethazine Other (See Comments)    Dizziness   Statins Other (See Comments)    These are too strong for me.   Sulfa Antibiotics Nausea And Vomiting     Medications: I have reviewed the patient's current medications.  Results for orders placed or performed during the hospital encounter of 04/10/24 (from the past 48 hours)  Comprehensive metabolic panel     Status: Abnormal   Collection Time: 04/10/24  5:15 PM  Result Value Ref Range   Sodium 140 135 - 145 mmol/L   Potassium 3.6 3.5 - 5.1 mmol/L   Chloride 104 98 - 111 mmol/L   CO2 24 22 - 32 mmol/L   Glucose, Bld 175 (H) 70 - 99 mg/dL    Comment: Glucose reference range applies only to samples taken after fasting for at least 8 hours.   BUN 25 (H) 8 - 23 mg/dL   Creatinine, Ser 9.09 0.44 - 1.00 mg/dL   Calcium 9.8 8.9 - 89.6 mg/dL   Total Protein 6.7 6.5 - 8.1 g/dL   Albumin 4.0 3.5 - 5.0 g/dL   AST 29 15 - 41 U/L   ALT 25 0 - 44 U/L   Alkaline Phosphatase 24 (L) 38 - 126 U/L   Total Bilirubin 0.4 0.0 - 1.2 mg/dL   GFR, Estimated 60 (L) >60 mL/min    Comment: (NOTE) Calculated using the CKD-EPI Creatinine Equation (2021)    Anion gap 12 5 - 15  Comment: Performed at [facility], [address] [place], [place] [address]  CBC     Status: None   Collection Time: 04/10/24  5:15 PM  Result Value Ref Range   WBC 7.9 4.0 - 10.5 K/uL   RBC 4.05 3.87 - 5.11 MIL/uL   Hemoglobin 12.6 12.0 - 15.0 g/dL   HCT 62.6 63.9 - 53.9 %   MCV 92.1 80.0 - 100.0 fL   MCH 31.1 26.0 - 34.0 pg   MCHC 33.8 30.0 - 36.0 g/dL   RDW 87.6 88.4 - 84.4 %   Platelets 249 150 - 400 K/uL   nRBC 0.0 0.0 - 0.2 %    Comment: Performed at [facility], [address] [place], [place] [address]  Troponin I (High Sensitivity)     Status: None   Collection Time: 04/10/24  5:15 PM  Result Value Ref Range   Troponin I (High Sensitivity) 3 <18 ng/L    Comment: (NOTE) Elevated high sensitivity troponin I (hsTnI) values and significant  changes across serial measurements may suggest ACS but many other  chronic and acute conditions are known to elevate hsTnI results.   Refer to the Links section for chest pain algorithms and additional  guidance. Performed at [facility], [address], [place], [place] [address]   Magnesium     Status: None   Collection Time: 04/10/24  5:15 PM  Result Value Ref Range   Magnesium 2.3 1.7 - 2.4 mg/dL    Comment: Performed at [facility], [address], [place], [place] [address]  Ethanol     Status: None   Collection Time: 04/10/24  5:16 PM  Result Value Ref Range   Alcohol, Ethyl (B) <15 <15 mg/dL    Comment: (NOTE) For medical purposes only. Performed at [facility], [address], [place], [place] [address]   Basic metabolic panel     Status: Abnormal   Collection Time: 04/11/24  3:29 AM  Result Value Ref Range   Sodium 135 135 - 145 mmol/L   Potassium 4.6 3.5 - 5.1 mmol/L   Chloride 103 98 - 111 mmol/L   CO2 24 22 - 32 mmol/L   Glucose, Bld 147 (H) 70 - 99 mg/dL    Comment: Glucose reference range applies only to samples taken after fasting for at least 8 hours.   BUN 23 8 - 23 mg/dL   Creatinine, Ser 9.13 0.44 - 1.00 mg/dL   Calcium 9.1 8.9 - 89.6 mg/dL   GFR, Estimated >39 >39 mL/min    Comment: (NOTE) Calculated using the CKD-EPI Creatinine Equation (2021)    Anion gap 8 5 - 15    Comment: Performed at [facility], [address], [place], [place] [address]  CBC     Status: Abnormal   Collection Time: 04/11/24  3:29 AM  Result Value Ref Range   WBC 13.7 (H) 4.0 - 10.5 K/uL   RBC 3.80 (L) 3.87 - 5.11 MIL/uL   Hemoglobin 11.8 (L) 12.0 - 15.0 g/dL   HCT 64.9 (L) 63.9 - 53.9 %   MCV 92.1 80.0 - 100.0 fL   MCH 31.1 26.0 - 34.0 pg   MCHC 33.7 30.0 - 36.0 g/dL   RDW 87.4 88.4 - 84.4 %   Platelets 172 150 - 400 K/uL   nRBC 0.0 0.0 - 0.2 %    Comment: Performed at [facility], [address] [place], [place] [address]  VITAMIN D 25 Hydroxy (Vit-D Deficiency, Fractures)     Status: None    Collection Time: 04/11/24  3:29 AM  Result Value Ref Range   Vit D, 25-Hydroxy 46.42 30 - 100 ng/mL    Comment: (NOTE) Vitamin D deficiency has been defined by the Institute of Medicine  and an Endocrine Society practice guideline as a level of serum 25-OH  vitamin D less than 20 ng/mL (1,2). The Endocrine Society went on to  further define vitamin D insufficiency as a level between 21 and 29  ng/mL (2).  1. IOM (Institute of Medicine). 2010. Dietary reference intakes for  calcium and D. [place]: [employer]. 2. Holick MF, Binkley Webb, Bischoff-Ferrari HA, et al. Evaluation,  treatment, and prevention of vitamin D deficiency: an Endocrine  Society clinical practice guideline, JCEM. 2011 Jul; 96(7): 1911-30.  Performed at [facility], [address], [place], [place] [address]   Surgical pcr screen     Status: None   Collection Time: 04/11/24  6:10 AM   Specimen: Nasal Mucosa; Nasal Swab  Result Value Ref Range   MRSA, PCR NEGATIVE NEGATIVE   Staphylococcus aureus NEGATIVE NEGATIVE    Comment: (NOTE) The Xpert SA Assay (FDA approved for NASAL specimens in patients 42 years of age and older), is one component of a comprehensive surveillance program. It is not intended to diagnose infection nor to guide or monitor treatment. Performed at [facility], [address], [place], [place] [address]     CT Angio Aortobifemoral W and/or Wo Contrast Result Date: 04/11/2024 CLINICAL DATA:  Acute bilateral displaced comminuted sacral ala fractures extending to the SI joints and S1 and S2 neural foramina. There is associated pelvic hemorrhage. We are evaluating for arterial injury. EXAM: CT ANGIOGRAPHY OF ABDOMINAL AORTA WITH ILIOFEMORAL RUNOFF TECHNIQUE: Multidetector CT imaging of the abdomen, pelvis and lower extremities was performed using the standard protocol during bolus administration of intravenous contrast. Multiplanar CT image  reconstructions and MIPs were obtained to evaluate the vascular anatomy. RADIATION DOSE REDUCTION: This exam was performed according to the departmental dose-optimization program which includes automated exposure control, adjustment of the mA and/or kV according to patient size and/or use of iterative reconstruction technique. CONTRAST:  OMNIPAQUE  IOHEXOL  350 MG/ML SOLN COMPARISON:  CT pelvis without contrast yesterday, CT abdomen and pelvis with IV contrast 01/13/2022. FINDINGS: VASCULAR Aorta: Moderate to heavy patchy calcific plaque. No stenosis, dissection or aneurysm. Celiac: There is a 75% vessel origin stenosis, partly due to ostial calcific plaques and partly due to compression by the median arcuate ligament of the diaphragm. There is no poststenotic dilatation. The vessel and its branches otherwise opacified well. The branching appears standard. No visible branch occlusion. SMA: Patent without evidence of aneurysm, dissection, vasculitis or significant stenosis. There are nonstenosing ostial calcific plaques along the superior wall. Renals: Both are single, both demonstrate moderate ostial calcific plaques. The calcifications are non stenosing on the left and on the right, the calcifications cause a 50% vessel origin stenosis. No other stenoses are seen.  No branch occlusions. IMA: There is an 80% calcific vessel origin stenosis. The vessel otherwise opacifies well. RIGHT Lower Extremity Inflow: No inflow arterial contrast extravasation is seen. There are scattered calcific plaques in the common iliac and internal iliac arteries. The external iliac artery is tortuous but plaque free. The inflow vessels do not show flow-limiting narrowing. Outflow: Common, superficial and profunda femoral arteries and the  popliteal artery are patent without evidence of aneurysm, dissection, vasculitis or significant stenosis. There are mild scattered nonstenosing calcific plaques in the superficial femoral and  popliteal artery. Runoff: Patent three vessel runoff to the ankle. There are mild scattered nonstenosing calcific plaques in the tibioperoneal trunk and anterior tibial artery. LEFT Lower Extremity Inflow: Common, internal and external iliac arteries are patent without evidence of aneurysm, dissection, vasculitis or significant stenosis. No inflow arterial contrast extravasation is seen. Small amount of scattered calcific plaque in the distal common iliac and internal iliac arteries. Outflow: Common, superficial and profunda femoral arteries and the popliteal artery are patent without evidence of aneurysm, dissection, vasculitis or significant stenosis. There are mild scattered nonstenosing calcific plaques in the superficial femoral artery. Runoff: The proximal anterior tibial artery is moderately irregularly stenotic, with scattered plaque and thrombosis. It occludes in the lower calf region and then reconstitutes again just above the transition to the dorsalis pedis. The peroneal and posterior tibial arteries both runoff into the foot. Veins: No obvious venous abnormality within the limitations of this arterial phase study. Review of the MIP images confirms the above findings. NON-VASCULAR Lower chest: The cardiac size is upper limit of normal. There is calcification in the right coronary artery. There is no substantial pericardial effusion. The lung bases clear. Hepatobiliary: No focal liver abnormality is seen. Stable post cholecystectomy common bile duct dilatation up to 12 mm is again noted. No visible filling defect. Pancreas: There is mild ductal dilatation without mass enhancement or inflammatory change. Spleen: No abnormality. Adrenals/Urinary Tract: There is no adrenal or renal mass enhancement. There is no urinary stone or obstruction. The bladder is unremarkable. Stomach/Bowel:  Negative stomach.  Negative unopacified small bowel. Evidence of previous ileocecectomy. Mild fecal stasis. No bowel  obstruction or inflammation. Lymphatic: No adenopathy is seen. Reproductive: Vascular calcifications in the uterine wall. Uterus and ovaries are not enlarged. Other: Moderate retroperitoneal hemorrhage tracking along the distal paracolic gutters collecting in posterior deep pelvis, increased from yesterday's CT. Presumably venous blood since no arterial extravasation is identified. Numerous pelvic phleboliths. Musculoskeletal: Comminuted sacral ala fractures are again noted as described previously. No other regional skeletal fracture is seen. Osteopenia and degenerative change lumbar spine. IMPRESSION: VASCULAR 1. No inflow arterial contrast extravasation bilaterally. 2. No aortic branch arterial occlusions are seen. 3. Atherosclerosis. 4. 75% celiac artery origin stenosis, partly due to ostial calcific plaques and partly due to compression by the median arcuate ligament of the diaphragm. 5. 80% IMA calcific vessel origin stenosis. 6. 50% right renal artery origin stenosis. 7. LEFT lower extremity runoff: The proximal anterior tibial artery is moderately irregularly stenotic, with scattered plaque and thrombosis. It occludes in the lower calf region and then reconstitutes again just above the transition to the dorsalis pedis. The peroneal and posterior tibial arteries both runoff into the foot. 8. RIGHT lower extremity runoff: Patent three-vessel runoff with mild atherosclerotic plaques. NON-VASCULAR 1. Moderate retroperitoneal hemorrhage in the distal paracolic gutters and posterior deep pelvis increased from yesterday's CT. Presumably venous blood since no arterial contrast extravasation is seen. 2. Comminuted sacral ala fractures as described previously. 3. Coronary artery disease. 4. Constipation. 5. Stable post cholecystectomy common bile duct dilatation up to 12 mm. 6. Mild pancreatic ductal dilatation without mass enhancement or inflammatory change. 7. Osteopenia and degenerative change. Aortic  Atherosclerosis (ICD10-I70.0). Electronically Signed   By: Francis Quam M.D.   On: 04/11/2024 03:23   CT PELVIS WO CONTRAST Result Date: 04/10/2024 CLINICAL DATA:  These  results were communicated via [email or web address] application on 04/10/2024 at 7:52 pm. EXAM: CT PELVIS WITHOUT CONTRAST TECHNIQUE: Multidetector CT imaging of the pelvis was performed following the standard protocol without intravenous contrast. RADIATION DOSE REDUCTION: This exam was performed according to the departmental dose-optimization program which includes automated exposure control, adjustment of the mA and/or kV according to patient size and/or use of iterative reconstruction technique. COMPARISON:  CT lumbar spine 04/10/2024. FINDINGS: Urinary Tract:  No abnormality visualized. Bowel: Bowel resection. Colonic diverticulosis. Unremarkable visualized pelvic bowel loops. Vascular/Lymphatic: Atherosclerotic plaque. No pathologically enlarged lymph nodes. No significant vascular abnormality seen. Reproductive:  No mass or other significant abnormality Other: Marked bilateral, left greater than right, presacral and pelvic sidewall fat stranding and hematoma formation. Associated left external iliac vasculature injury not excluded with markedly limited evaluation on this noncontrast study (4:44). No intraperitoneal free fluid. No intraperitoneal free gas. No organized fluid collection. Musculoskeletal: Acute bilateral displaced and comminuted sacral ala fractures extending to the sacroiliac joints as well as S1 and S2 osseous neural foramina. No acute displaced fracture or dislocation of either hips. Slightly asymmetric left iliopsoas muscles with underlying hematoma not excluded. Please see separately dictated CT lumbar spine 04/10/2024. IMPRESSION: 1. Acute bilateral displaced and comminuted sacral ala fractures extending to the sacroiliac joints as well as S1 and S2 osseous neural foramina. 2. Marked bilateral, left greater than right, presacral and  pelvic sidewall fat stranding and hematoma formation. Concern for possible underlying left external iliac vasculature injury-markedly limited evaluation on this noncontrast study. 3.  Aortic Atherosclerosis (ICD10-I70.0). 4. Slightly asymmetric left iliopsoas muscles with underlying hematoma not excluded. 5. Please see separately dictated CT lumbar spine 04/10/2024. Electronically Signed   By: Morgane  Naveau M.D.   On: 04/10/2024 20:01   CT Lumbar Spine Wo Contrast Result Date: 04/10/2024 CLINICAL DATA:  Back trauma, no prior imaging (Age >= 16y) Pt reports a fall earlier today in her kitchen, C/O left hip pain. EXAM: CT LUMBAR SPINE WITHOUT CONTRAST TECHNIQUE: Multidetector CT imaging of the lumbar spine was performed without intravenous contrast administration. Multiplanar CT image reconstructions were also generated. RADIATION DOSE REDUCTION: This exam was performed according to the departmental dose-optimization program which includes automated exposure control, adjustment of the mA and/or kV according to patient size and/or use of iterative reconstruction technique. COMPARISON:  CT abdomen pelvis 01/13/2022, CT pelvis 04/10/2024 FINDINGS: Segmentation: 5 lumbar type vertebrae. Alignment: Grade 1 anterolisthesis of L4 on L5 and L5 on S1. Vertebrae: Diffusely decreased bone density. Multilevel mild degenerative changes of the spine. Multilevel disc bulge. Acute minimally displaced left L5 transverse process fractures. Similar-appearing chronic mild L5 vertebral body height loss. No aggressive appearing focal pathologic process. Paraspinal and other soft tissues: Negative. Disc levels: Maintained. Other: Sacral fracture noted, please see separately dictated CT pelvis 04/10/2024. Atherosclerotic plaque of the aorta. Fat stranding along the pelvic sidewalls partially visualized. IMPRESSION: 1. Acute minimally displaced left L5 transverse process fractures. 2. Sacral fracture noted, please see separately dictated  CT pelvis 04/10/2024. Electronically Signed   By: Morgane  Naveau M.D.   On: 04/10/2024 19:52   CT HEAD WO CONTRAST Result Date: 04/10/2024 CLINICAL DATA:  Head trauma, moderate-severe; Polytrauma, blunt EXAM: CT HEAD WITHOUT CONTRAST CT CERVICAL SPINE WITHOUT CONTRAST TECHNIQUE: Multidetector CT imaging of the head and cervical spine was performed following the standard protocol without intravenous contrast. Multiplanar CT image reconstructions of the cervical spine were also generated. RADIATION DOSE REDUCTION: This exam was performed according to the departmental dose-optimization program which includes  automated exposure control, adjustment of the mA and/or kV according to patient size and/or use of iterative reconstruction technique. COMPARISON:  None Available. FINDINGS: CT HEAD FINDINGS Brain: No evidence of large-territorial acute infarction. No parenchymal hemorrhage. No mass lesion. No extra-axial collection. No mass effect or midline shift. No hydrocephalus. Basilar cisterns are patent. Vascular: No hyperdense vessel. Atherosclerotic calcifications are present within the cavernous internal carotid and vertebral arteries. Skull: No acute fracture or focal lesion. Sinuses/Orbits: Paranasal sinuses and mastoid air cells are clear. The orbits are unremarkable. Other: None. CT CERVICAL SPINE FINDINGS Alignment: Grade 1 anterolisthesis of C2 on C3, C3 on C4, C4 on C5, C5 on C6. Skull base and vertebrae: Multilevel degenerative change of the spine with severe osseous neural foraminal stenosis at the C3-C4 level on the left. No acute fracture. No aggressive appearing focal osseous lesion or focal pathologic process. Soft tissues and spinal canal: No prevertebral fluid or swelling. No visible canal hematoma. Upper chest: Unremarkable. Other: None. IMPRESSION: 1. No acute intracranial abnormality. 2. No acute displaced fracture or traumatic listhesis of the cervical spine. Electronically Signed   By: Morgane   Naveau M.D.   On: 04/10/2024 19:46   CT CERVICAL SPINE WO CONTRAST Result Date: 04/10/2024 CLINICAL DATA:  Head trauma, moderate-severe; Polytrauma, blunt EXAM: CT HEAD WITHOUT CONTRAST CT CERVICAL SPINE WITHOUT CONTRAST TECHNIQUE: Multidetector CT imaging of the head and cervical spine was performed following the standard protocol without intravenous contrast. Multiplanar CT image reconstructions of the cervical spine were also generated. RADIATION DOSE REDUCTION: This exam was performed according to the departmental dose-optimization program which includes automated exposure control, adjustment of the mA and/or kV according to patient size and/or use of iterative reconstruction technique. COMPARISON:  None Available. FINDINGS: CT HEAD FINDINGS Brain: No evidence of large-territorial acute infarction. No parenchymal hemorrhage. No mass lesion. No extra-axial collection. No mass effect or midline shift. No hydrocephalus. Basilar cisterns are patent. Vascular: No hyperdense vessel. Atherosclerotic calcifications are present within the cavernous internal carotid and vertebral arteries. Skull: No acute fracture or focal lesion. Sinuses/Orbits: Paranasal sinuses and mastoid air cells are clear. The orbits are unremarkable. Other: None. CT CERVICAL SPINE FINDINGS Alignment: Grade 1 anterolisthesis of C2 on C3, C3 on C4, C4 on C5, C5 on C6. Skull base and vertebrae: Multilevel degenerative change of the spine with severe osseous neural foraminal stenosis at the C3-C4 level on the left. No acute fracture. No aggressive appearing focal osseous lesion or focal pathologic process. Soft tissues and spinal canal: No prevertebral fluid or swelling. No visible canal hematoma. Upper chest: Unremarkable. Other: None. IMPRESSION: 1. No acute intracranial abnormality. 2. No acute displaced fracture or traumatic listhesis of the cervical spine. Electronically Signed   By: Morgane  Naveau M.D.   On: 04/10/2024 19:46   DG Pelvis  Portable Result Date: 04/10/2024 CLINICAL DATA:  Trauma, fall. EXAM: PORTABLE PELVIS 1-2 VIEWS COMPARISON:  None Available. FINDINGS: There is no evidence of pelvic fracture or diastasis. No pelvic bone lesions are seen. Surgical staple line is seen in the right abdomen. IMPRESSION: Negative. Electronically Signed   By: Greig Pique M.D.   On: 04/10/2024 18:01   DG Chest Port 1 View Result Date: 04/10/2024 CLINICAL DATA:  Trauma EXAM: PORTABLE CHEST 1 VIEW COMPARISON:  Chest x-ray 06/01/2022 FINDINGS: The heart size and mediastinal contours are within normal limits. There are calcified nodules in the right lower lung, new from prior. The lungs are otherwise clear. There is no pleural effusion or pneumothorax. The  visualized skeletal structures are unremarkable. IMPRESSION: 1. No active disease. 2. New calcified right lower lobe nodules. Electronically Signed   By: Greig Pique M.D.   On: 04/10/2024 17:55    Review of Systems  HENT:  Negative for ear discharge, ear pain, hearing loss and tinnitus.   Eyes:  Negative for photophobia and pain.  Respiratory:  Negative for cough and shortness of breath.   Cardiovascular:  Negative for chest pain.  Gastrointestinal:  Negative for abdominal pain, nausea and vomiting.  Genitourinary:  Negative for dysuria, flank pain, frequency and urgency.  Musculoskeletal:  Positive for back pain. Negative for myalgias and neck pain.  Neurological:  Negative for dizziness and headaches.  Hematological:  Does not bruise/bleed easily.  Psychiatric/Behavioral:  The patient is not nervous/anxious.    Blood pressure 138/68, pulse 65, temperature 97.7 F (36.5 C), temperature source Oral, resp. rate 18, height 4' 11 (1.499 m), weight 48 kg, SpO2 97%. Physical Exam Constitutional:      General: She is not in acute distress.    Appearance: She is well-developed. She is not diaphoretic.  HENT:     Head: Normocephalic and atraumatic.   Eyes:     General: No scleral  icterus.       Right eye: No discharge.        Left eye: No discharge.     Conjunctiva/sclera: Conjunctivae normal.    Cardiovascular:     Rate and Rhythm: Normal rate and regular rhythm.  Pulmonary:     Effort: Pulmonary effort is normal. No respiratory distress.   Musculoskeletal:     Cervical back: Normal range of motion.     Comments: Pelvis--no traumatic wounds or rash, no ecchymosis, stable to manual stress, nontender  BLE No traumatic wounds, ecchymosis, or rash  Nontender  No knee or ankle effusion  Knee stable to varus/ valgus and anterior/posterior stress  Sens DPN, SPN, TN intact  Motor EHL, ext, flex, evers 5/5  DP 1+, PT 1+, No significant edema   Skin:    General: Skin is warm and dry.   Neurological:     Mental Status: She is alert.   Psychiatric:        Mood and Affect: Mood normal.        Behavior: Behavior normal.     Assessment/Plan: Sacral fx -- Plan SI screw fixation tomorrow morning with Dr. Kendal. Awaiting transfer to [facility]. NPO after MN.    Ozell DOROTHA Ned, PA-C Orthopedic Surgery [phone] 04/11/2024, 1:10 PM

## 2024-04-11 NOTE — Progress Notes (Addendum)
 PROGRESS NOTE  Danielle Proctor  FMW:[id number] DOB: 1928/12/16 DOA: 04/10/2024 PCP: Clarice Nottingham, MD   Brief Narrative: Patient is a 89 year old female with history of osteoporosis, mild cognitive impairment, IBS, hypertension, hyperlipidemia who presented after a fall from home.  She subsequently developed pain on the lower back and tailbone after the fall.  Patient is quite independent with her ADLs and ambulates without assistive device.  On presentation, she was mildly hypertensive.  Chest x-ray showed new calcified right lower lobe nodules but no active disease.  CT head/CT cervical spine did not show any acute findings.  CT pelvis showed acute bilateral displaced/comminuted sacral alla fractures and likely hematoma formation in the presacral and pelvic sidewall .  CT lumbar spine showed acute minimally displaced left L5 transverse process fractures.  Orthopedics consulted.Plan for SI screw fixation tomorrow morning with Dr. Kendal at [facility].  Assessment & Plan:  Principal Problem:   Sacral fracture [facility]) Active Problems:   Fall   Closed fracture of transverse process of lumbar vertebra (HCC)   Mechanical fall/sacral fracture/L5 transverse process fractures: History of fall at home.  Presented with low back pain, tailbone pain.CT pelvis showed acute bilateral displaced/comminuted sacral alla fractures and likely hematoma formation in the presacral and pelvic sidewall .  CT lumbar spine showed acute minimally displaced left L5 transverse process fractures.  Orthopedics consulted.  Continue pain management, supportive care.  Plan for SI screw fixation tomorrow morning with Dr. Kendal at [facility].  Pelvic hematoma: CT of the pelvis also showed marked bilateral, left more than right, presacral and pelvic sidewall fat stranding and hematoma formation, possible underlying left external iliac vasculature injury and likely asymmetric left iliopsoas muscle with underlying hematoma not excluded.   Hemoglobin stable.  CTA aorta bifemoral did not show any arterial injury, showed scattered arterial stenosis,moderate retroperitoneal hemorrhage in the distal paracolicgutters and posterior deep pelvis increased from prior  CT,presumably venous blood .  Denies any significant pain on the lower abdomen.  Will continue to monitor.  Hopefully the hematoma will resolve by itself.  This is secondary to trauma from fall  Pulmonary nodules: Possible new calcified right lower lobe nodules.  Unclear if we need to investigate more on this 89 year old female.  She denies any respiratory symptoms  Hypothyroidism: Continue Synthyroid  Vitamin B12 deficiency: Vitamin B12 of  380.  Started on supplementation  Osteoporosis:Vitamin D level pending  Insomnia: Ordered home Ativan   Leukocytosis: Most likely reactive.  Continue to monitor       DVT prophylaxis:SCD     Code Status: Limited: Do not attempt resuscitation (DNR) -DNR-LIMITED -Do Not Intubate/DNI   Family Communication: Called and discussed with Gilmer Lunger on phone on 6/26  Patient status:Inpatient  Patient is from :Home  Anticipated discharge to:SNF  Estimated DC date:2-3 days   Consultants: Orthopedics  Procedures:None yet  Antimicrobials:  Anti-infectives (From admission, onward)    None       Subjective: Patient seen and examined at bedside today.  She was lying on the bed.  Appears comfortable.  Pain on the tailbone and lower back is not significantly bad.  She is completely alert and oriented, denies any abdominal pain, nausea or vomiting.  No chest pain or shortness of breath.  Objective: Vitals:   04/10/24 1715 04/10/24 2251 04/11/24 0127 04/11/24 0613  BP: (!) 176/95 (!) 161/59 (!) 155/63 138/68  Pulse: 66 74 76 65  Resp: 18 18 18 18   Temp: 97.9 F (36.6 C) 97.9 F (36.6 C) 97.6  F (36.4 C) 97.7 F (36.5 C)  TempSrc: Oral Oral Axillary Oral  SpO2: 94% 96% 94% 97%  Weight:      Height:         Intake/Output Summary (Last 24 hours) at 04/11/2024 0741 Last data filed at 04/11/2024 0600 Gross per 24 hour  Intake 759.08 ml  Output --  Net 759.08 ml   Filed Weights   04/10/24 1711  Weight: 48 kg    Examination:  General exam: Overall comfortable, not in distress, pleasant elderly female HEENT: PERRL Respiratory system:  no wheezes or crackles  Cardiovascular system: S1 & S2 heard, RRR.  Gastrointestinal system: Abdomen is nondistended, soft and nontender. Central nervous system: Alert and oriented Extremities: No edema, no clubbing ,no cyanosis Skin: No rashes, no ulcers,no icterus     Data Reviewed: I have personally reviewed following labs and imaging studies  CBC: Recent Labs  Lab 04/10/24 1715 04/11/24 0329  WBC 7.9 13.7*  HGB 12.6 11.8*  HCT 37.3 35.0*  MCV 92.1 92.1  PLT 249 172   Basic Metabolic Panel: Recent Labs  Lab 04/10/24 1715 04/11/24 0329  NA 140 135  K 3.6 4.6  CL 104 103  CO2 24 24  GLUCOSE 175* 147*  BUN 25* 23  CREATININE 0.90 0.86  CALCIUM 9.8 9.1  MG 2.3  --      Recent Results (from the past 240 hours)  Surgical pcr screen     Status: None   Collection Time: 04/11/24  6:10 AM   Specimen: Nasal Mucosa; Nasal Swab  Result Value Ref Range Status   MRSA, PCR NEGATIVE NEGATIVE Final   Staphylococcus aureus NEGATIVE NEGATIVE Final    Comment: (NOTE) The Xpert SA Assay (FDA approved for NASAL specimens in patients 54 years of age and older), is one component of a comprehensive surveillance program. It is not intended to diagnose infection nor to guide or monitor treatment. Performed at [facility], [address], [place], [place] [address]      Radiology Studies: CT Angio Aortobifemoral W and/or Wo Contrast Result Date: 04/11/2024 CLINICAL DATA:  Acute bilateral displaced comminuted sacral ala fractures extending to the SI joints and S1 and S2 neural foramina. There is associated pelvic hemorrhage.  We are evaluating for arterial injury. EXAM: CT ANGIOGRAPHY OF ABDOMINAL AORTA WITH ILIOFEMORAL RUNOFF TECHNIQUE: Multidetector CT imaging of the abdomen, pelvis and lower extremities was performed using the standard protocol during bolus administration of intravenous contrast. Multiplanar CT image reconstructions and MIPs were obtained to evaluate the vascular anatomy. RADIATION DOSE REDUCTION: This exam was performed according to the departmental dose-optimization program which includes automated exposure control, adjustment of the mA and/or kV according to patient size and/or use of iterative reconstruction technique. CONTRAST:  OMNIPAQUE  IOHEXOL  350 MG/ML SOLN COMPARISON:  CT pelvis without contrast yesterday, CT abdomen and pelvis with IV contrast 01/13/2022. FINDINGS: VASCULAR Aorta: Moderate to heavy patchy calcific plaque. No stenosis, dissection or aneurysm. Celiac: There is a 75% vessel origin stenosis, partly due to ostial calcific plaques and partly due to compression by the median arcuate ligament of the diaphragm. There is no poststenotic dilatation. The vessel and its branches otherwise opacified well. The branching appears standard. No visible branch occlusion. SMA: Patent without evidence of aneurysm, dissection, vasculitis or significant stenosis. There are nonstenosing ostial calcific plaques along the superior wall. Renals: Both are single, both demonstrate moderate ostial calcific plaques. The calcifications are non stenosing on the left and on the  right, the calcifications cause a 50% vessel origin stenosis. No other stenoses are seen.  No branch occlusions. IMA: There is an 80% calcific vessel origin stenosis. The vessel otherwise opacifies well. RIGHT Lower Extremity Inflow: No inflow arterial contrast extravasation is seen. There are scattered calcific plaques in the common iliac and internal iliac arteries. The external iliac artery is tortuous but plaque free. The inflow vessels do  not show flow-limiting narrowing. Outflow: Common, superficial and profunda femoral arteries and the popliteal artery are patent without evidence of aneurysm, dissection, vasculitis or significant stenosis. There are mild scattered nonstenosing calcific plaques in the superficial femoral and popliteal artery. Runoff: Patent three vessel runoff to the ankle. There are mild scattered nonstenosing calcific plaques in the tibioperoneal trunk and anterior tibial artery. LEFT Lower Extremity Inflow: Common, internal and external iliac arteries are patent without evidence of aneurysm, dissection, vasculitis or significant stenosis. No inflow arterial contrast extravasation is seen. Small amount of scattered calcific plaque in the distal common iliac and internal iliac arteries. Outflow: Common, superficial and profunda femoral arteries and the popliteal artery are patent without evidence of aneurysm, dissection, vasculitis or significant stenosis. There are mild scattered nonstenosing calcific plaques in the superficial femoral artery. Runoff: The proximal anterior tibial artery is moderately irregularly stenotic, with scattered plaque and thrombosis. It occludes in the lower calf region and then reconstitutes again just above the transition to the dorsalis pedis. The peroneal and posterior tibial arteries both runoff into the foot. Veins: No obvious venous abnormality within the limitations of this arterial phase study. Review of the MIP images confirms the above findings. NON-VASCULAR Lower chest: The cardiac size is upper limit of normal. There is calcification in the right coronary artery. There is no substantial pericardial effusion. The lung bases clear. Hepatobiliary: No focal liver abnormality is seen. Stable post cholecystectomy common bile duct dilatation up to 12 mm is again noted. No visible filling defect. Pancreas: There is mild ductal dilatation without mass enhancement or inflammatory change. Spleen: No  abnormality. Adrenals/Urinary Tract: There is no adrenal or renal mass enhancement. There is no urinary stone or obstruction. The bladder is unremarkable. Stomach/Bowel:  Negative stomach.  Negative unopacified small bowel. Evidence of previous ileocecectomy. Mild fecal stasis. No bowel obstruction or inflammation. Lymphatic: No adenopathy is seen. Reproductive: Vascular calcifications in the uterine wall. Uterus and ovaries are not enlarged. Other: Moderate retroperitoneal hemorrhage tracking along the distal paracolic gutters collecting in posterior deep pelvis, increased from yesterday's CT. Presumably venous blood since no arterial extravasation is identified. Numerous pelvic phleboliths. Musculoskeletal: Comminuted sacral ala fractures are again noted as described previously. No other regional skeletal fracture is seen. Osteopenia and degenerative change lumbar spine. IMPRESSION: VASCULAR 1. No inflow arterial contrast extravasation bilaterally. 2. No aortic branch arterial occlusions are seen. 3. Atherosclerosis. 4. 75% celiac artery origin stenosis, partly due to ostial calcific plaques and partly due to compression by the median arcuate ligament of the diaphragm. 5. 80% IMA calcific vessel origin stenosis. 6. 50% right renal artery origin stenosis. 7. LEFT lower extremity runoff: The proximal anterior tibial artery is moderately irregularly stenotic, with scattered plaque and thrombosis. It occludes in the lower calf region and then reconstitutes again just above the transition to the dorsalis pedis. The peroneal and posterior tibial arteries both runoff into the foot. 8. RIGHT lower extremity runoff: Patent three-vessel runoff with mild atherosclerotic plaques. NON-VASCULAR 1. Moderate retroperitoneal hemorrhage in the distal paracolic gutters and posterior deep pelvis increased from yesterday's CT. Presumably  venous blood since no arterial contrast extravasation is seen. 2. Comminuted sacral ala fractures  as described previously. 3. Coronary artery disease. 4. Constipation. 5. Stable post cholecystectomy common bile duct dilatation up to 12 mm. 6. Mild pancreatic ductal dilatation without mass enhancement or inflammatory change. 7. Osteopenia and degenerative change. Aortic Atherosclerosis (ICD10-I70.0). Electronically Signed   By: Francis Quam M.D.   On: 04/11/2024 03:23   CT PELVIS WO CONTRAST Result Date: 04/10/2024 CLINICAL DATA:  These results were communicated via [email or web address] application on 04/10/2024 at 7:52 pm. EXAM: CT PELVIS WITHOUT CONTRAST TECHNIQUE: Multidetector CT imaging of the pelvis was performed following the standard protocol without intravenous contrast. RADIATION DOSE REDUCTION: This exam was performed according to the departmental dose-optimization program which includes automated exposure control, adjustment of the mA and/or kV according to patient size and/or use of iterative reconstruction technique. COMPARISON:  CT lumbar spine 04/10/2024. FINDINGS: Urinary Tract:  No abnormality visualized. Bowel: Bowel resection. Colonic diverticulosis. Unremarkable visualized pelvic bowel loops. Vascular/Lymphatic: Atherosclerotic plaque. No pathologically enlarged lymph nodes. No significant vascular abnormality seen. Reproductive:  No mass or other significant abnormality Other: Marked bilateral, left greater than right, presacral and pelvic sidewall fat stranding and hematoma formation. Associated left external iliac vasculature injury not excluded with markedly limited evaluation on this noncontrast study (4:44). No intraperitoneal free fluid. No intraperitoneal free gas. No organized fluid collection. Musculoskeletal: Acute bilateral displaced and comminuted sacral ala fractures extending to the sacroiliac joints as well as S1 and S2 osseous neural foramina. No acute displaced fracture or dislocation of either hips. Slightly asymmetric left iliopsoas muscles with underlying hematoma not excluded.  Please see separately dictated CT lumbar spine 04/10/2024. IMPRESSION: 1. Acute bilateral displaced and comminuted sacral ala fractures extending to the sacroiliac joints as well as S1 and S2 osseous neural foramina. 2. Marked bilateral, left greater than right, presacral and pelvic sidewall fat stranding and hematoma formation. Concern for possible underlying left external iliac vasculature injury-markedly limited evaluation on this noncontrast study. 3.  Aortic Atherosclerosis (ICD10-I70.0). 4. Slightly asymmetric left iliopsoas muscles with underlying hematoma not excluded. 5. Please see separately dictated CT lumbar spine 04/10/2024. Electronically Signed   By: Morgane  Naveau M.D.   On: 04/10/2024 20:01   CT Lumbar Spine Wo Contrast Result Date: 04/10/2024 CLINICAL DATA:  Back trauma, no prior imaging (Age >= 16y) Pt reports a fall earlier today in her kitchen, C/O left hip pain. EXAM: CT LUMBAR SPINE WITHOUT CONTRAST TECHNIQUE: Multidetector CT imaging of the lumbar spine was performed without intravenous contrast administration. Multiplanar CT image reconstructions were also generated. RADIATION DOSE REDUCTION: This exam was performed according to the departmental dose-optimization program which includes automated exposure control, adjustment of the mA and/or kV according to patient size and/or use of iterative reconstruction technique. COMPARISON:  CT abdomen pelvis 01/13/2022, CT pelvis 04/10/2024 FINDINGS: Segmentation: 5 lumbar type vertebrae. Alignment: Grade 1 anterolisthesis of L4 on L5 and L5 on S1. Vertebrae: Diffusely decreased bone density. Multilevel mild degenerative changes of the spine. Multilevel disc bulge. Acute minimally displaced left L5 transverse process fractures. Similar-appearing chronic mild L5 vertebral body height loss. No aggressive appearing focal pathologic process. Paraspinal and other soft tissues: Negative. Disc levels: Maintained. Other: Sacral fracture noted, please see  separately dictated CT pelvis 04/10/2024. Atherosclerotic plaque of the aorta. Fat stranding along the pelvic sidewalls partially visualized. IMPRESSION: 1. Acute minimally displaced left L5 transverse process fractures. 2. Sacral fracture noted, please see separately dictated CT pelvis 04/10/2024. Electronically Signed  By: Morgane  Naveau M.D.   On: 04/10/2024 19:52   CT HEAD WO CONTRAST Result Date: 04/10/2024 CLINICAL DATA:  Head trauma, moderate-severe; Polytrauma, blunt EXAM: CT HEAD WITHOUT CONTRAST CT CERVICAL SPINE WITHOUT CONTRAST TECHNIQUE: Multidetector CT imaging of the head and cervical spine was performed following the standard protocol without intravenous contrast. Multiplanar CT image reconstructions of the cervical spine were also generated. RADIATION DOSE REDUCTION: This exam was performed according to the departmental dose-optimization program which includes automated exposure control, adjustment of the mA and/or kV according to patient size and/or use of iterative reconstruction technique. COMPARISON:  None Available. FINDINGS: CT HEAD FINDINGS Brain: No evidence of large-territorial acute infarction. No parenchymal hemorrhage. No mass lesion. No extra-axial collection. No mass effect or midline shift. No hydrocephalus. Basilar cisterns are patent. Vascular: No hyperdense vessel. Atherosclerotic calcifications are present within the cavernous internal carotid and vertebral arteries. Skull: No acute fracture or focal lesion. Sinuses/Orbits: Paranasal sinuses and mastoid air cells are clear. The orbits are unremarkable. Other: None. CT CERVICAL SPINE FINDINGS Alignment: Grade 1 anterolisthesis of C2 on C3, C3 on C4, C4 on C5, C5 on C6. Skull base and vertebrae: Multilevel degenerative change of the spine with severe osseous neural foraminal stenosis at the C3-C4 level on the left. No acute fracture. No aggressive appearing focal osseous lesion or focal pathologic process. Soft tissues and  spinal canal: No prevertebral fluid or swelling. No visible canal hematoma. Upper chest: Unremarkable. Other: None. IMPRESSION: 1. No acute intracranial abnormality. 2. No acute displaced fracture or traumatic listhesis of the cervical spine. Electronically Signed   By: Morgane  Naveau M.D.   On: 04/10/2024 19:46   CT CERVICAL SPINE WO CONTRAST Result Date: 04/10/2024 CLINICAL DATA:  Head trauma, moderate-severe; Polytrauma, blunt EXAM: CT HEAD WITHOUT CONTRAST CT CERVICAL SPINE WITHOUT CONTRAST TECHNIQUE: Multidetector CT imaging of the head and cervical spine was performed following the standard protocol without intravenous contrast. Multiplanar CT image reconstructions of the cervical spine were also generated. RADIATION DOSE REDUCTION: This exam was performed according to the departmental dose-optimization program which includes automated exposure control, adjustment of the mA and/or kV according to patient size and/or use of iterative reconstruction technique. COMPARISON:  None Available. FINDINGS: CT HEAD FINDINGS Brain: No evidence of large-territorial acute infarction. No parenchymal hemorrhage. No mass lesion. No extra-axial collection. No mass effect or midline shift. No hydrocephalus. Basilar cisterns are patent. Vascular: No hyperdense vessel. Atherosclerotic calcifications are present within the cavernous internal carotid and vertebral arteries. Skull: No acute fracture or focal lesion. Sinuses/Orbits: Paranasal sinuses and mastoid air cells are clear. The orbits are unremarkable. Other: None. CT CERVICAL SPINE FINDINGS Alignment: Grade 1 anterolisthesis of C2 on C3, C3 on C4, C4 on C5, C5 on C6. Skull base and vertebrae: Multilevel degenerative change of the spine with severe osseous neural foraminal stenosis at the C3-C4 level on the left. No acute fracture. No aggressive appearing focal osseous lesion or focal pathologic process. Soft tissues and spinal canal: No prevertebral fluid or swelling. No  visible canal hematoma. Upper chest: Unremarkable. Other: None. IMPRESSION: 1. No acute intracranial abnormality. 2. No acute displaced fracture or traumatic listhesis of the cervical spine. Electronically Signed   By: Morgane  Naveau M.D.   On: 04/10/2024 19:46   DG Pelvis Portable Result Date: 04/10/2024 CLINICAL DATA:  Trauma, fall. EXAM: PORTABLE PELVIS 1-2 VIEWS COMPARISON:  None Available. FINDINGS: There is no evidence of pelvic fracture or diastasis. No pelvic bone lesions are seen. Surgical staple line  is seen in the right abdomen. IMPRESSION: Negative. Electronically Signed   By: Greig Pique M.D.   On: 04/10/2024 18:01   DG Chest Port 1 View Result Date: 04/10/2024 CLINICAL DATA:  Trauma EXAM: PORTABLE CHEST 1 VIEW COMPARISON:  Chest x-ray 06/01/2022 FINDINGS: The heart size and mediastinal contours are within normal limits. There are calcified nodules in the right lower lung, new from prior. The lungs are otherwise clear. There is no pleural effusion or pneumothorax. The visualized skeletal structures are unremarkable. IMPRESSION: 1. No active disease. 2. New calcified right lower lobe nodules. Electronically Signed   By: Greig Pique M.D.   On: 04/10/2024 17:55    Scheduled Meds:  acetaminophen  1,000 mg Oral TID   vitamin B-12  1,000 mcg Oral Daily   levothyroxine  137 mcg Oral Q0600   lidocaine  1 patch Transdermal Q24H   LORazepam   0.5 mg Oral QHS   Continuous Infusions:   LOS: 1 day   Ivonne Mustache, MD [facility] P6/26/2025, 7:41 AM

## 2024-04-11 NOTE — H&P (View-Only) (Signed)
 Reason for Consult:Sacral fx Referring Physician: Ivonne Mustache Time called: [id number] Time at bedside: 1135   Danielle Proctor is an 89 y.o. female.  HPI: Danielle Proctor was about to go through a door and had her hand on the panel. She's unsure what happened but fell and landed on her butt/back. She had immediate left hip and back pain. She managed to get help and got up. Her friend convinced her to come to the ED via ambulance and workup showed a left sacral fx and orthopedic surgery was consulted. She lives at home (just took in the daughter of a friend) and does not use any assistive devices to ambulate.  Past Medical History:  Diagnosis Date   Atherosclerotic peripheral vascular disease (HCC)    Diverticulosis    Hypercholesteremia    Hypertension    IBS (irritable bowel syndrome)    Osteoarthritis    Osteoporosis    Thyroid  disease     Past Surgical History:  Procedure Laterality Date   APPENDECTOMY     CHOLECYSTECTOMY     COLON SURGERY      Family History  Problem Relation Age of Onset   Heart attack Mother    Heart attack Father    Lung cancer Brother     Social History:  reports that she has never smoked. She has never used smokeless tobacco. She reports that she does not currently use alcohol. She reports that she does not use drugs.  Allergies:  Allergies  Allergen Reactions   Other Other (See Comments)    NO NUTS, SEEDED FOODS, PEANUTS, ETC..  Mepergan Plus Phenergan = Blacked out   Alendronate Other (See Comments)    GI upset   Dicyclomine  Other (See Comments)    Dizziness   Erythromycin Diarrhea   Ezetimibe Other (See Comments)    Hands ached   Hydroxyzine Other (See Comments)    Excessive drowsiness   Hyoscyamine  Other (See Comments)    Weak   Prednisone  Other (See Comments)    Dizziness    Promethazine Other (See Comments)    Dizziness   Statins Other (See Comments)    These are too strong for me.   Sulfa Antibiotics Nausea And Vomiting     Medications: I have reviewed the patient's current medications.  Results for orders placed or performed during the hospital encounter of 04/10/24 (from the past 48 hours)  Comprehensive metabolic panel     Status: Abnormal   Collection Time: 04/10/24  5:15 PM  Result Value Ref Range   Sodium 140 135 - 145 mmol/L   Potassium 3.6 3.5 - 5.1 mmol/L   Chloride 104 98 - 111 mmol/L   CO2 24 22 - 32 mmol/L   Glucose, Bld 175 (H) 70 - 99 mg/dL    Comment: Glucose reference range applies only to samples taken after fasting for at least 8 hours.   BUN 25 (H) 8 - 23 mg/dL   Creatinine, Ser 9.09 0.44 - 1.00 mg/dL   Calcium 9.8 8.9 - 89.6 mg/dL   Total Protein 6.7 6.5 - 8.1 g/dL   Albumin 4.0 3.5 - 5.0 g/dL   AST 29 15 - 41 U/L   ALT 25 0 - 44 U/L   Alkaline Phosphatase 24 (L) 38 - 126 U/L   Total Bilirubin 0.4 0.0 - 1.2 mg/dL   GFR, Estimated 60 (L) >60 mL/min    Comment: (NOTE) Calculated using the CKD-EPI Creatinine Equation (2021)    Anion gap 12 5 - 15  Comment: Performed at [facility], [address] [place], [place] [address]  CBC     Status: None   Collection Time: 04/10/24  5:15 PM  Result Value Ref Range   WBC 7.9 4.0 - 10.5 K/uL   RBC 4.05 3.87 - 5.11 MIL/uL   Hemoglobin 12.6 12.0 - 15.0 g/dL   HCT 62.6 63.9 - 53.9 %   MCV 92.1 80.0 - 100.0 fL   MCH 31.1 26.0 - 34.0 pg   MCHC 33.8 30.0 - 36.0 g/dL   RDW 87.6 88.4 - 84.4 %   Platelets 249 150 - 400 K/uL   nRBC 0.0 0.0 - 0.2 %    Comment: Performed at [facility], [address] [place], [place] [address]  Troponin I (High Sensitivity)     Status: None   Collection Time: 04/10/24  5:15 PM  Result Value Ref Range   Troponin I (High Sensitivity) 3 <18 ng/L    Comment: (NOTE) Elevated high sensitivity troponin I (hsTnI) values and significant  changes across serial measurements may suggest ACS but many other  chronic and acute conditions are known to elevate hsTnI results.   Refer to the Links section for chest pain algorithms and additional  guidance. Performed at [facility], [address], [place], [place] [address]   Magnesium     Status: None   Collection Time: 04/10/24  5:15 PM  Result Value Ref Range   Magnesium 2.3 1.7 - 2.4 mg/dL    Comment: Performed at [facility], [address], [place], [place] [address]  Ethanol     Status: None   Collection Time: 04/10/24  5:16 PM  Result Value Ref Range   Alcohol, Ethyl (B) <15 <15 mg/dL    Comment: (NOTE) For medical purposes only. Performed at [facility], [address], [place], [place] [address]   Basic metabolic panel     Status: Abnormal   Collection Time: 04/11/24  3:29 AM  Result Value Ref Range   Sodium 135 135 - 145 mmol/L   Potassium 4.6 3.5 - 5.1 mmol/L   Chloride 103 98 - 111 mmol/L   CO2 24 22 - 32 mmol/L   Glucose, Bld 147 (H) 70 - 99 mg/dL    Comment: Glucose reference range applies only to samples taken after fasting for at least 8 hours.   BUN 23 8 - 23 mg/dL   Creatinine, Ser 9.13 0.44 - 1.00 mg/dL   Calcium 9.1 8.9 - 89.6 mg/dL   GFR, Estimated >39 >39 mL/min    Comment: (NOTE) Calculated using the CKD-EPI Creatinine Equation (2021)    Anion gap 8 5 - 15    Comment: Performed at [facility], [address], [place], [place] [address]  CBC     Status: Abnormal   Collection Time: 04/11/24  3:29 AM  Result Value Ref Range   WBC 13.7 (H) 4.0 - 10.5 K/uL   RBC 3.80 (L) 3.87 - 5.11 MIL/uL   Hemoglobin 11.8 (L) 12.0 - 15.0 g/dL   HCT 64.9 (L) 63.9 - 53.9 %   MCV 92.1 80.0 - 100.0 fL   MCH 31.1 26.0 - 34.0 pg   MCHC 33.7 30.0 - 36.0 g/dL   RDW 87.4 88.4 - 84.4 %   Platelets 172 150 - 400 K/uL   nRBC 0.0 0.0 - 0.2 %    Comment: Performed at [facility], [address] [place], [place] [address]  VITAMIN D 25 Hydroxy (Vit-D Deficiency, Fractures)     Status: None    Collection Time: 04/11/24  3:29 AM  Result Value Ref Range   Vit D, 25-Hydroxy 46.42 30 - 100 ng/mL    Comment: (NOTE) Vitamin D deficiency has been defined by the Institute of Medicine  and an Endocrine Society practice guideline as a level of serum 25-OH  vitamin D less than 20 ng/mL (1,2). The Endocrine Society went on to  further define vitamin D insufficiency as a level between 21 and 29  ng/mL (2).  1. IOM (Institute of Medicine). 2010. Dietary reference intakes for  calcium and D. [place]: [employer]. 2. Holick MF, Binkley Webb, Bischoff-Ferrari HA, et al. Evaluation,  treatment, and prevention of vitamin D deficiency: an Endocrine  Society clinical practice guideline, JCEM. 2011 Jul; 96(7): 1911-30.  Performed at [facility], [address], [place], [place] [address]   Surgical pcr screen     Status: None   Collection Time: 04/11/24  6:10 AM   Specimen: Nasal Mucosa; Nasal Swab  Result Value Ref Range   MRSA, PCR NEGATIVE NEGATIVE   Staphylococcus aureus NEGATIVE NEGATIVE    Comment: (NOTE) The Xpert SA Assay (FDA approved for NASAL specimens in patients 42 years of age and older), is one component of a comprehensive surveillance program. It is not intended to diagnose infection nor to guide or monitor treatment. Performed at [facility], [address], [place], [place] [address]     CT Angio Aortobifemoral W and/or Wo Contrast Result Date: 04/11/2024 CLINICAL DATA:  Acute bilateral displaced comminuted sacral ala fractures extending to the SI joints and S1 and S2 neural foramina. There is associated pelvic hemorrhage. We are evaluating for arterial injury. EXAM: CT ANGIOGRAPHY OF ABDOMINAL AORTA WITH ILIOFEMORAL RUNOFF TECHNIQUE: Multidetector CT imaging of the abdomen, pelvis and lower extremities was performed using the standard protocol during bolus administration of intravenous contrast. Multiplanar CT image  reconstructions and MIPs were obtained to evaluate the vascular anatomy. RADIATION DOSE REDUCTION: This exam was performed according to the departmental dose-optimization program which includes automated exposure control, adjustment of the mA and/or kV according to patient size and/or use of iterative reconstruction technique. CONTRAST:  OMNIPAQUE  IOHEXOL  350 MG/ML SOLN COMPARISON:  CT pelvis without contrast yesterday, CT abdomen and pelvis with IV contrast 01/13/2022. FINDINGS: VASCULAR Aorta: Moderate to heavy patchy calcific plaque. No stenosis, dissection or aneurysm. Celiac: There is a 75% vessel origin stenosis, partly due to ostial calcific plaques and partly due to compression by the median arcuate ligament of the diaphragm. There is no poststenotic dilatation. The vessel and its branches otherwise opacified well. The branching appears standard. No visible branch occlusion. SMA: Patent without evidence of aneurysm, dissection, vasculitis or significant stenosis. There are nonstenosing ostial calcific plaques along the superior wall. Renals: Both are single, both demonstrate moderate ostial calcific plaques. The calcifications are non stenosing on the left and on the right, the calcifications cause a 50% vessel origin stenosis. No other stenoses are seen.  No branch occlusions. IMA: There is an 80% calcific vessel origin stenosis. The vessel otherwise opacifies well. RIGHT Lower Extremity Inflow: No inflow arterial contrast extravasation is seen. There are scattered calcific plaques in the common iliac and internal iliac arteries. The external iliac artery is tortuous but plaque free. The inflow vessels do not show flow-limiting narrowing. Outflow: Common, superficial and profunda femoral arteries and the  popliteal artery are patent without evidence of aneurysm, dissection, vasculitis or significant stenosis. There are mild scattered nonstenosing calcific plaques in the superficial femoral and  popliteal artery. Runoff: Patent three vessel runoff to the ankle. There are mild scattered nonstenosing calcific plaques in the tibioperoneal trunk and anterior tibial artery. LEFT Lower Extremity Inflow: Common, internal and external iliac arteries are patent without evidence of aneurysm, dissection, vasculitis or significant stenosis. No inflow arterial contrast extravasation is seen. Small amount of scattered calcific plaque in the distal common iliac and internal iliac arteries. Outflow: Common, superficial and profunda femoral arteries and the popliteal artery are patent without evidence of aneurysm, dissection, vasculitis or significant stenosis. There are mild scattered nonstenosing calcific plaques in the superficial femoral artery. Runoff: The proximal anterior tibial artery is moderately irregularly stenotic, with scattered plaque and thrombosis. It occludes in the lower calf region and then reconstitutes again just above the transition to the dorsalis pedis. The peroneal and posterior tibial arteries both runoff into the foot. Veins: No obvious venous abnormality within the limitations of this arterial phase study. Review of the MIP images confirms the above findings. NON-VASCULAR Lower chest: The cardiac size is upper limit of normal. There is calcification in the right coronary artery. There is no substantial pericardial effusion. The lung bases clear. Hepatobiliary: No focal liver abnormality is seen. Stable post cholecystectomy common bile duct dilatation up to 12 mm is again noted. No visible filling defect. Pancreas: There is mild ductal dilatation without mass enhancement or inflammatory change. Spleen: No abnormality. Adrenals/Urinary Tract: There is no adrenal or renal mass enhancement. There is no urinary stone or obstruction. The bladder is unremarkable. Stomach/Bowel:  Negative stomach.  Negative unopacified small bowel. Evidence of previous ileocecectomy. Mild fecal stasis. No bowel  obstruction or inflammation. Lymphatic: No adenopathy is seen. Reproductive: Vascular calcifications in the uterine wall. Uterus and ovaries are not enlarged. Other: Moderate retroperitoneal hemorrhage tracking along the distal paracolic gutters collecting in posterior deep pelvis, increased from yesterday's CT. Presumably venous blood since no arterial extravasation is identified. Numerous pelvic phleboliths. Musculoskeletal: Comminuted sacral ala fractures are again noted as described previously. No other regional skeletal fracture is seen. Osteopenia and degenerative change lumbar spine. IMPRESSION: VASCULAR 1. No inflow arterial contrast extravasation bilaterally. 2. No aortic branch arterial occlusions are seen. 3. Atherosclerosis. 4. 75% celiac artery origin stenosis, partly due to ostial calcific plaques and partly due to compression by the median arcuate ligament of the diaphragm. 5. 80% IMA calcific vessel origin stenosis. 6. 50% right renal artery origin stenosis. 7. LEFT lower extremity runoff: The proximal anterior tibial artery is moderately irregularly stenotic, with scattered plaque and thrombosis. It occludes in the lower calf region and then reconstitutes again just above the transition to the dorsalis pedis. The peroneal and posterior tibial arteries both runoff into the foot. 8. RIGHT lower extremity runoff: Patent three-vessel runoff with mild atherosclerotic plaques. NON-VASCULAR 1. Moderate retroperitoneal hemorrhage in the distal paracolic gutters and posterior deep pelvis increased from yesterday's CT. Presumably venous blood since no arterial contrast extravasation is seen. 2. Comminuted sacral ala fractures as described previously. 3. Coronary artery disease. 4. Constipation. 5. Stable post cholecystectomy common bile duct dilatation up to 12 mm. 6. Mild pancreatic ductal dilatation without mass enhancement or inflammatory change. 7. Osteopenia and degenerative change. Aortic  Atherosclerosis (ICD10-I70.0). Electronically Signed   By: Francis Quam M.D.   On: 04/11/2024 03:23   CT PELVIS WO CONTRAST Result Date: 04/10/2024 CLINICAL DATA:  These  results were communicated via [email or web address] application on 04/10/2024 at 7:52 pm. EXAM: CT PELVIS WITHOUT CONTRAST TECHNIQUE: Multidetector CT imaging of the pelvis was performed following the standard protocol without intravenous contrast. RADIATION DOSE REDUCTION: This exam was performed according to the departmental dose-optimization program which includes automated exposure control, adjustment of the mA and/or kV according to patient size and/or use of iterative reconstruction technique. COMPARISON:  CT lumbar spine 04/10/2024. FINDINGS: Urinary Tract:  No abnormality visualized. Bowel: Bowel resection. Colonic diverticulosis. Unremarkable visualized pelvic bowel loops. Vascular/Lymphatic: Atherosclerotic plaque. No pathologically enlarged lymph nodes. No significant vascular abnormality seen. Reproductive:  No mass or other significant abnormality Other: Marked bilateral, left greater than right, presacral and pelvic sidewall fat stranding and hematoma formation. Associated left external iliac vasculature injury not excluded with markedly limited evaluation on this noncontrast study (4:44). No intraperitoneal free fluid. No intraperitoneal free gas. No organized fluid collection. Musculoskeletal: Acute bilateral displaced and comminuted sacral ala fractures extending to the sacroiliac joints as well as S1 and S2 osseous neural foramina. No acute displaced fracture or dislocation of either hips. Slightly asymmetric left iliopsoas muscles with underlying hematoma not excluded. Please see separately dictated CT lumbar spine 04/10/2024. IMPRESSION: 1. Acute bilateral displaced and comminuted sacral ala fractures extending to the sacroiliac joints as well as S1 and S2 osseous neural foramina. 2. Marked bilateral, left greater than right, presacral and  pelvic sidewall fat stranding and hematoma formation. Concern for possible underlying left external iliac vasculature injury-markedly limited evaluation on this noncontrast study. 3.  Aortic Atherosclerosis (ICD10-I70.0). 4. Slightly asymmetric left iliopsoas muscles with underlying hematoma not excluded. 5. Please see separately dictated CT lumbar spine 04/10/2024. Electronically Signed   By: Morgane  Naveau M.D.   On: 04/10/2024 20:01   CT Lumbar Spine Wo Contrast Result Date: 04/10/2024 CLINICAL DATA:  Back trauma, no prior imaging (Age >= 16y) Pt reports a fall earlier today in her kitchen, C/O left hip pain. EXAM: CT LUMBAR SPINE WITHOUT CONTRAST TECHNIQUE: Multidetector CT imaging of the lumbar spine was performed without intravenous contrast administration. Multiplanar CT image reconstructions were also generated. RADIATION DOSE REDUCTION: This exam was performed according to the departmental dose-optimization program which includes automated exposure control, adjustment of the mA and/or kV according to patient size and/or use of iterative reconstruction technique. COMPARISON:  CT abdomen pelvis 01/13/2022, CT pelvis 04/10/2024 FINDINGS: Segmentation: 5 lumbar type vertebrae. Alignment: Grade 1 anterolisthesis of L4 on L5 and L5 on S1. Vertebrae: Diffusely decreased bone density. Multilevel mild degenerative changes of the spine. Multilevel disc bulge. Acute minimally displaced left L5 transverse process fractures. Similar-appearing chronic mild L5 vertebral body height loss. No aggressive appearing focal pathologic process. Paraspinal and other soft tissues: Negative. Disc levels: Maintained. Other: Sacral fracture noted, please see separately dictated CT pelvis 04/10/2024. Atherosclerotic plaque of the aorta. Fat stranding along the pelvic sidewalls partially visualized. IMPRESSION: 1. Acute minimally displaced left L5 transverse process fractures. 2. Sacral fracture noted, please see separately dictated  CT pelvis 04/10/2024. Electronically Signed   By: Morgane  Naveau M.D.   On: 04/10/2024 19:52   CT HEAD WO CONTRAST Result Date: 04/10/2024 CLINICAL DATA:  Head trauma, moderate-severe; Polytrauma, blunt EXAM: CT HEAD WITHOUT CONTRAST CT CERVICAL SPINE WITHOUT CONTRAST TECHNIQUE: Multidetector CT imaging of the head and cervical spine was performed following the standard protocol without intravenous contrast. Multiplanar CT image reconstructions of the cervical spine were also generated. RADIATION DOSE REDUCTION: This exam was performed according to the departmental dose-optimization program which includes  automated exposure control, adjustment of the mA and/or kV according to patient size and/or use of iterative reconstruction technique. COMPARISON:  None Available. FINDINGS: CT HEAD FINDINGS Brain: No evidence of large-territorial acute infarction. No parenchymal hemorrhage. No mass lesion. No extra-axial collection. No mass effect or midline shift. No hydrocephalus. Basilar cisterns are patent. Vascular: No hyperdense vessel. Atherosclerotic calcifications are present within the cavernous internal carotid and vertebral arteries. Skull: No acute fracture or focal lesion. Sinuses/Orbits: Paranasal sinuses and mastoid air cells are clear. The orbits are unremarkable. Other: None. CT CERVICAL SPINE FINDINGS Alignment: Grade 1 anterolisthesis of C2 on C3, C3 on C4, C4 on C5, C5 on C6. Skull base and vertebrae: Multilevel degenerative change of the spine with severe osseous neural foraminal stenosis at the C3-C4 level on the left. No acute fracture. No aggressive appearing focal osseous lesion or focal pathologic process. Soft tissues and spinal canal: No prevertebral fluid or swelling. No visible canal hematoma. Upper chest: Unremarkable. Other: None. IMPRESSION: 1. No acute intracranial abnormality. 2. No acute displaced fracture or traumatic listhesis of the cervical spine. Electronically Signed   By: Morgane   Naveau M.D.   On: 04/10/2024 19:46   CT CERVICAL SPINE WO CONTRAST Result Date: 04/10/2024 CLINICAL DATA:  Head trauma, moderate-severe; Polytrauma, blunt EXAM: CT HEAD WITHOUT CONTRAST CT CERVICAL SPINE WITHOUT CONTRAST TECHNIQUE: Multidetector CT imaging of the head and cervical spine was performed following the standard protocol without intravenous contrast. Multiplanar CT image reconstructions of the cervical spine were also generated. RADIATION DOSE REDUCTION: This exam was performed according to the departmental dose-optimization program which includes automated exposure control, adjustment of the mA and/or kV according to patient size and/or use of iterative reconstruction technique. COMPARISON:  None Available. FINDINGS: CT HEAD FINDINGS Brain: No evidence of large-territorial acute infarction. No parenchymal hemorrhage. No mass lesion. No extra-axial collection. No mass effect or midline shift. No hydrocephalus. Basilar cisterns are patent. Vascular: No hyperdense vessel. Atherosclerotic calcifications are present within the cavernous internal carotid and vertebral arteries. Skull: No acute fracture or focal lesion. Sinuses/Orbits: Paranasal sinuses and mastoid air cells are clear. The orbits are unremarkable. Other: None. CT CERVICAL SPINE FINDINGS Alignment: Grade 1 anterolisthesis of C2 on C3, C3 on C4, C4 on C5, C5 on C6. Skull base and vertebrae: Multilevel degenerative change of the spine with severe osseous neural foraminal stenosis at the C3-C4 level on the left. No acute fracture. No aggressive appearing focal osseous lesion or focal pathologic process. Soft tissues and spinal canal: No prevertebral fluid or swelling. No visible canal hematoma. Upper chest: Unremarkable. Other: None. IMPRESSION: 1. No acute intracranial abnormality. 2. No acute displaced fracture or traumatic listhesis of the cervical spine. Electronically Signed   By: Morgane  Naveau M.D.   On: 04/10/2024 19:46   DG Pelvis  Portable Result Date: 04/10/2024 CLINICAL DATA:  Trauma, fall. EXAM: PORTABLE PELVIS 1-2 VIEWS COMPARISON:  None Available. FINDINGS: There is no evidence of pelvic fracture or diastasis. No pelvic bone lesions are seen. Surgical staple line is seen in the right abdomen. IMPRESSION: Negative. Electronically Signed   By: Greig Pique M.D.   On: 04/10/2024 18:01   DG Chest Port 1 View Result Date: 04/10/2024 CLINICAL DATA:  Trauma EXAM: PORTABLE CHEST 1 VIEW COMPARISON:  Chest x-ray 06/01/2022 FINDINGS: The heart size and mediastinal contours are within normal limits. There are calcified nodules in the right lower lung, new from prior. The lungs are otherwise clear. There is no pleural effusion or pneumothorax. The  visualized skeletal structures are unremarkable. IMPRESSION: 1. No active disease. 2. New calcified right lower lobe nodules. Electronically Signed   By: Greig Pique M.D.   On: 04/10/2024 17:55    Review of Systems  HENT:  Negative for ear discharge, ear pain, hearing loss and tinnitus.   Eyes:  Negative for photophobia and pain.  Respiratory:  Negative for cough and shortness of breath.   Cardiovascular:  Negative for chest pain.  Gastrointestinal:  Negative for abdominal pain, nausea and vomiting.  Genitourinary:  Negative for dysuria, flank pain, frequency and urgency.  Musculoskeletal:  Positive for back pain. Negative for myalgias and neck pain.  Neurological:  Negative for dizziness and headaches.  Hematological:  Does not bruise/bleed easily.  Psychiatric/Behavioral:  The patient is not nervous/anxious.    Blood pressure 138/68, pulse 65, temperature 97.7 F (36.5 C), temperature source Oral, resp. rate 18, height 4' 11 (1.499 m), weight 48 kg, SpO2 97%. Physical Exam Constitutional:      General: She is not in acute distress.    Appearance: She is well-developed. She is not diaphoretic.  HENT:     Head: Normocephalic and atraumatic.   Eyes:     General: No scleral  icterus.       Right eye: No discharge.        Left eye: No discharge.     Conjunctiva/sclera: Conjunctivae normal.    Cardiovascular:     Rate and Rhythm: Normal rate and regular rhythm.  Pulmonary:     Effort: Pulmonary effort is normal. No respiratory distress.   Musculoskeletal:     Cervical back: Normal range of motion.     Comments: Pelvis--no traumatic wounds or rash, no ecchymosis, stable to manual stress, nontender  BLE No traumatic wounds, ecchymosis, or rash  Nontender  No knee or ankle effusion  Knee stable to varus/ valgus and anterior/posterior stress  Sens DPN, SPN, TN intact  Motor EHL, ext, flex, evers 5/5  DP 1+, PT 1+, No significant edema   Skin:    General: Skin is warm and dry.   Neurological:     Mental Status: She is alert.   Psychiatric:        Mood and Affect: Mood normal.        Behavior: Behavior normal.     Assessment/Plan: Sacral fx -- Plan SI screw fixation tomorrow morning with Dr. Kendal. Awaiting transfer to [facility]. NPO after MN.    Ozell DOROTHA Ned, PA-C Orthopedic Surgery [phone] 04/11/2024, 1:10 PM

## 2024-04-11 NOTE — Progress Notes (Signed)
 Report given to receiving nurse @5N  Joseph. [facility] called @[phone] 

## 2024-04-11 NOTE — Progress Notes (Signed)
 Patient left with [facility] for [facility]

## 2024-04-12 ENCOUNTER — Encounter (HOSPITAL_COMMUNITY): Payer: Self-pay | Admitting: Student

## 2024-04-12 ENCOUNTER — Inpatient Hospital Stay (HOSPITAL_COMMUNITY)

## 2024-04-12 ENCOUNTER — Encounter (HOSPITAL_COMMUNITY)
Admission: EM | Disposition: A | Discharge status: Home / Self Care | Payer: Self-pay | Source: Home / Self Care | Attending: Internal Medicine

## 2024-04-12 DIAGNOSIS — E039 Hypothyroidism, unspecified: Secondary | ICD-10-CM | POA: Diagnosis not present

## 2024-04-12 DIAGNOSIS — I10 Essential (primary) hypertension: Secondary | ICD-10-CM | POA: Diagnosis not present

## 2024-04-12 DIAGNOSIS — S3210XA Unspecified fracture of sacrum, initial encounter for closed fracture: Secondary | ICD-10-CM

## 2024-04-12 HISTORY — PX: ORIF PELVIC FRACTURE WITH PERCUTANEOUS SCREWS: SHX6800

## 2024-04-12 LAB — URINALYSIS, ROUTINE W REFLEX MICROSCOPIC
Bacteria, UA: NONE SEEN
Bilirubin Urine: NEGATIVE
Glucose, UA: 50 mg/dL — AB
Hgb urine dipstick: NEGATIVE
Ketones, ur: NEGATIVE mg/dL
Nitrite: NEGATIVE
Protein, ur: NEGATIVE mg/dL
Specific Gravity, Urine: 1.034 — ABNORMAL HIGH (ref 1.005–1.030)
pH: 6 (ref 5.0–8.0)

## 2024-04-12 LAB — CBC
HCT: 31.7 % — ABNORMAL LOW (ref 36.0–46.0)
Hemoglobin: 10.7 g/dL — ABNORMAL LOW (ref 12.0–15.0)
MCH: 31 pg (ref 26.0–34.0)
MCHC: 33.8 g/dL (ref 30.0–36.0)
MCV: 91.9 fL (ref 80.0–100.0)
Platelets: 127 10*3/uL — ABNORMAL LOW (ref 150–400)
RBC: 3.45 MIL/uL — ABNORMAL LOW (ref 3.87–5.11)
RDW: 12.6 % (ref 11.5–15.5)
WBC: 8.9 10*3/uL (ref 4.0–10.5)
nRBC: 0 % (ref 0.0–0.2)

## 2024-04-12 LAB — BASIC METABOLIC PANEL WITH GFR
Anion gap: 7 (ref 5–15)
BUN: 18 mg/dL (ref 8–23)
CO2: 28 mmol/L (ref 22–32)
Calcium: 9.2 mg/dL (ref 8.9–10.3)
Chloride: 101 mmol/L (ref 98–111)
Creatinine, Ser: 0.97 mg/dL (ref 0.44–1.00)
GFR, Estimated: 55 mL/min — ABNORMAL LOW (ref >60)
Glucose, Bld: 131 mg/dL — ABNORMAL HIGH (ref 70–99)
Potassium: 4 mmol/L (ref 3.5–5.1)
Sodium: 136 mmol/L (ref 135–145)

## 2024-04-12 LAB — SURGICAL PCR SCREEN
MRSA, PCR: NEGATIVE
Staphylococcus aureus: NEGATIVE

## 2024-04-12 SURGERY — CLOSED REDUCTION, PELVIS, WITH PERCUTANEOUS FIXATION
Anesthesia: General | Laterality: Bilateral

## 2024-04-12 MED ORDER — PROPOFOL 10 MG/ML IV BOLUS
INTRAVENOUS | Status: DC | PRN
  Administered 2024-04-12: 20 mg via INTRAVENOUS
  Administered 2024-04-12: 60 mg via INTRAVENOUS
  Administered 2024-04-12: 100 ug/kg/min via INTRAVENOUS

## 2024-04-12 MED ORDER — AMISULPRIDE (ANTIEMETIC) 5 MG/2ML IV SOLN: 5.0000 mg | Freq: Once | INTRAVENOUS | Status: DC | PRN

## 2024-04-12 MED ORDER — DEXAMETHASONE SODIUM PHOSPHATE 10 MG/ML IJ SOLN
INTRAMUSCULAR | Status: AC
  Filled 2024-04-12: qty 1

## 2024-04-12 MED ORDER — FENTANYL CITRATE (PF) 250 MCG/5ML IJ SOLN
INTRAMUSCULAR | Status: AC
  Filled 2024-04-12: qty 5

## 2024-04-12 MED ORDER — CHLORHEXIDINE GLUCONATE 0.12 % MT SOLN: 15.0000 mL | Freq: Once | OROMUCOSAL | Status: AC

## 2024-04-12 MED ORDER — PHENYLEPHRINE 80 MCG/ML (10ML) SYRINGE FOR IV PUSH (FOR BLOOD PRESSURE SUPPORT)
PREFILLED_SYRINGE | INTRAVENOUS | Status: DC | PRN
  Administered 2024-04-12 (×2): 80 ug via INTRAVENOUS

## 2024-04-12 MED ORDER — LIDOCAINE 2% (20 MG/ML) 5 ML SYRINGE
INTRAMUSCULAR | Status: DC | PRN
  Administered 2024-04-12: 50 mg via INTRAVENOUS

## 2024-04-12 MED ORDER — FENTANYL CITRATE (PF) 100 MCG/2ML IJ SOLN: 25.0000 ug | INTRAMUSCULAR | Status: DC | PRN

## 2024-04-12 MED ORDER — ONDANSETRON HCL 4 MG/2ML IJ SOLN
INTRAMUSCULAR | Status: AC
  Filled 2024-04-12: qty 2

## 2024-04-12 MED ORDER — LACTATED RINGERS IV SOLN: INTRAVENOUS | Status: DC

## 2024-04-12 MED ORDER — CEFAZOLIN SODIUM-DEXTROSE 2-4 GM/100ML-% IV SOLN: 2.0000 g | Freq: Three times a day (TID) | INTRAVENOUS | Status: DC

## 2024-04-12 MED ORDER — HYDRALAZINE HCL 25 MG PO TABS
25.0000 mg | ORAL_TABLET | Freq: Four times a day (QID) | ORAL | Status: DC | PRN
  Administered 2024-04-12: 25 mg via ORAL
  Filled 2024-04-12: qty 1

## 2024-04-12 MED ORDER — ROCURONIUM BROMIDE 10 MG/ML (PF) SYRINGE
PREFILLED_SYRINGE | INTRAVENOUS | Status: AC
  Filled 2024-04-12: qty 10

## 2024-04-12 MED ORDER — PHENYLEPHRINE 80 MCG/ML (10ML) SYRINGE FOR IV PUSH (FOR BLOOD PRESSURE SUPPORT)
PREFILLED_SYRINGE | INTRAVENOUS | Status: AC
  Filled 2024-04-12: qty 10

## 2024-04-12 MED ORDER — FENTANYL CITRATE (PF) 250 MCG/5ML IJ SOLN
INTRAMUSCULAR | Status: DC | PRN
  Administered 2024-04-12 (×2): 50 ug via INTRAVENOUS

## 2024-04-12 MED ORDER — VANCOMYCIN HCL 1000 MG IV SOLR
INTRAVENOUS | Status: AC
  Filled 2024-04-12: qty 20

## 2024-04-12 MED ORDER — LIDOCAINE 2% (20 MG/ML) 5 ML SYRINGE
INTRAMUSCULAR | Status: AC
  Filled 2024-04-12: qty 5

## 2024-04-12 MED ORDER — ONDANSETRON HCL 4 MG/2ML IJ SOLN
INTRAMUSCULAR | Status: DC | PRN
  Administered 2024-04-12: 4 mg via INTRAVENOUS

## 2024-04-12 MED ORDER — PHENYLEPHRINE HCL-NACL 20-0.9 MG/250ML-% IV SOLN
INTRAVENOUS | Status: DC | PRN
  Administered 2024-04-12: 50 ug/min via INTRAVENOUS

## 2024-04-12 MED ORDER — CHLORHEXIDINE GLUCONATE 0.12 % MT SOLN
OROMUCOSAL | Status: AC
  Administered 2024-04-12: 15 mL via OROMUCOSAL
  Filled 2024-04-12: qty 15

## 2024-04-12 MED ORDER — 0.9 % SODIUM CHLORIDE (POUR BTL) OPTIME
TOPICAL | Status: DC | PRN
  Administered 2024-04-12: 1000 mL

## 2024-04-12 MED ORDER — SUGAMMADEX SODIUM 200 MG/2ML IV SOLN
INTRAVENOUS | Status: DC | PRN
  Administered 2024-04-12: 200 mg via INTRAVENOUS

## 2024-04-12 MED ORDER — ROCURONIUM BROMIDE 10 MG/ML (PF) SYRINGE
PREFILLED_SYRINGE | INTRAVENOUS | Status: DC | PRN
  Administered 2024-04-12: 40 mg via INTRAVENOUS

## 2024-04-12 MED ORDER — PROPOFOL 10 MG/ML IV BOLUS
INTRAVENOUS | Status: AC
  Filled 2024-04-12: qty 20

## 2024-04-12 MED ORDER — DEXAMETHASONE SODIUM PHOSPHATE 10 MG/ML IJ SOLN
INTRAMUSCULAR | Status: DC | PRN
  Administered 2024-04-12: 10 mg via INTRAVENOUS

## 2024-04-12 MED ORDER — CEFAZOLIN SODIUM-DEXTROSE 2-4 GM/100ML-% IV SOLN
2.0000 g | Freq: Two times a day (BID) | INTRAVENOUS | Status: AC
  Administered 2024-04-12 – 2024-04-13 (×2): 2 g via INTRAVENOUS
  Filled 2024-04-12 (×2): qty 100

## 2024-04-12 MED ORDER — HYDROCODONE-ACETAMINOPHEN 5-325 MG PO TABS
1.0000 | ORAL_TABLET | Freq: Four times a day (QID) | ORAL | Status: DC | PRN
  Administered 2024-04-12 – 2024-04-14 (×6): 1 via ORAL
  Filled 2024-04-12 (×6): qty 1

## 2024-04-12 MED ORDER — ORAL CARE MOUTH RINSE: 15.0000 mL | Freq: Once | OROMUCOSAL | Status: AC

## 2024-04-12 MED ORDER — ENOXAPARIN SODIUM 30 MG/0.3ML IJ SOSY
30.0000 mg | PREFILLED_SYRINGE | INTRAMUSCULAR | Status: DC
  Administered 2024-04-13 – 2024-04-14 (×2): 30 mg via SUBCUTANEOUS
  Filled 2024-04-12 (×2): qty 0.3

## 2024-04-12 SURGICAL SUPPLY — 38 items
BAG COUNTER SPONGE SURGICOUNT (BAG) ×1 IMPLANT
BIT DRILL CANN 4.5MM (BIT) IMPLANT
BLADE CLIPPER SURG (BLADE) IMPLANT
BLADE SURG 11 STRL SS (BLADE) ×1 IMPLANT
CHLORAPREP W/TINT 26 (MISCELLANEOUS) ×1 IMPLANT
DERMABOND ADVANCED .7 DNX12 (GAUZE/BANDAGES/DRESSINGS) IMPLANT
DRAPE C-ARM 42X72 X-RAY (DRAPES) ×1 IMPLANT
DRAPE C-ARMOR (DRAPES) ×1 IMPLANT
DRAPE HALF SHEET 40X57 (DRAPES) ×1 IMPLANT
DRAPE INCISE IOBAN 66X45 STRL (DRAPES) ×1 IMPLANT
DRAPE SURG 17X23 STRL (DRAPES) ×6 IMPLANT
DRAPE U-SHAPE 47X51 STRL (DRAPES) ×1 IMPLANT
DRESSING MEPILEX FLEX 4X4 (GAUZE/BANDAGES/DRESSINGS) IMPLANT
DRSG MEPILEX POST OP 4X8 (GAUZE/BANDAGES/DRESSINGS) IMPLANT
ELECTRODE REM PT RTRN 9FT ADLT (ELECTROSURGICAL) ×1 IMPLANT
GLOVE BIO SURGEON STRL SZ 6.5 (GLOVE) ×3 IMPLANT
GLOVE BIO SURGEON STRL SZ7.5 (GLOVE) ×4 IMPLANT
GLOVE BIOGEL PI IND STRL 6.5 (GLOVE) ×1 IMPLANT
GLOVE BIOGEL PI IND STRL 7.5 (GLOVE) ×1 IMPLANT
GOWN STRL REUS W/ TWL LRG LVL3 (GOWN DISPOSABLE) ×2 IMPLANT
GUIDEWIRE 2.0MM (WIRE) IMPLANT
GUIDEWIRE 2.8 THREAD 450 L ST (WIRE) IMPLANT
KIT BASIN OR (CUSTOM PROCEDURE TRAY) ×1 IMPLANT
KIT TURNOVER KIT B (KITS) ×1 IMPLANT
MANIFOLD NEPTUNE II (INSTRUMENTS) ×1 IMPLANT
NS IRRIG 1000ML POUR BTL (IV SOLUTION) ×1 IMPLANT
PACK TOTAL JOINT (CUSTOM PROCEDURE TRAY) ×1 IMPLANT
PACK UNIVERSAL I (CUSTOM PROCEDURE TRAY) ×1 IMPLANT
PAD ARMBOARD POSITIONER FOAM (MISCELLANEOUS) ×2 IMPLANT
SCREW CANN FT 7.5X155 STL (Screw) IMPLANT
SPONGE T-LAP 18X18 ~~LOC~~+RFID (SPONGE) IMPLANT
STAPLER SKIN PROX 35W (STAPLE) ×1 IMPLANT
SUCTION TUBE FRAZIER 10FR DISP (SUCTIONS) ×1 IMPLANT
SUT MNCRL AB 3-0 PS2 18 (SUTURE) ×1 IMPLANT
SUT MON AB 2-0 CT1 36 (SUTURE) ×1 IMPLANT
TRAY FOLEY MTR SLVR 16FR STAT (SET/KITS/TRAYS/PACK) IMPLANT
WASHER F/7.5 CANN SCREW (Washer) IMPLANT
WATER STERILE IRR 1000ML POUR (IV SOLUTION) ×1 IMPLANT

## 2024-04-12 NOTE — Transfer of Care (Signed)
 Immediate Anesthesia Transfer of Care Note  Patient: Danielle Proctor  Procedure(s) Performed: CLOSED REDUCTION, PELVIS, WITH PERCUTANEOUS FIXATION (Bilateral)  Patient Location: PACU  Anesthesia Type:General  Level of Consciousness: drowsy  Airway & Oxygen Therapy: Patient Spontanous Breathing and Patient connected to nasal cannula oxygen  Post-op Assessment: Report given to RN and Post -op Vital signs reviewed and stable  Post vital signs: Reviewed and stable  Last Vitals:  Vitals Value Taken Time  BP    Temp    Pulse    Resp    SpO2      Last Pain:  Vitals:   04/12/24 0712  TempSrc:   PainSc: 0-No pain         Complications: No notable events documented.

## 2024-04-12 NOTE — Progress Notes (Signed)
 PROGRESS NOTE    Danielle Proctor  FMW:[id number] DOB: 06-Feb-1929 DOA: 04/10/2024 PCP: Clarice Nottingham, MD   Brief Narrative:  89 year old female with history of osteoporosis, mild cognitive impairment, IBS, hypertension, hyperlipidemia presented after a fall from home along with lower back pain.  On presentation, Chest x-ray showed new calcified right lower lobe nodules but no active disease.  CT head/CT cervical spine did not show any acute findings.  CT pelvis showed acute bilateral displaced/comminuted sacral alla fractures and likely hematoma formation in the presacral and pelvic sidewall .  CT lumbar spine showed acute minimally displaced left L5 transverse process fractures.  Orthopedics consulted: she was transferred to [facility]  Assessment & Plan:   L5 transverse process fractures Commuted sacral ala fractures Mechanical fall Pelvic hematoma -Imaging as above.  Fall precautions.  N.p.o. for possible surgical intervention by orthopedics today.  Hemoglobin stable on presentation.  No labs today. - Continue pain management.  Will need PT eval after surgery  Pulmonary nodules -Possible new calcified right lower lobe nodules.  Unclear if we need to investigate more on this 89 year old female.  She denies any respiratory symptoms  Hypothyroidism -Continue Synthroid   Probable vitamin B12 deficiency--continue oral supplementation  Insomnia - Continue Ativan  at bedtime  Leukocytosis - Possibly reactive.  No labs today.  Monitor intermittently  Anemia of chronic disease - From chronic illnesses.  Hemoglobin stable on presentation.  Monitor intermittently   DVT prophylaxis: SCDs Code Status: DNR Family Communication: None at bedside Disposition Plan: Status is: Inpatient Remains inpatient appropriate because: Of severity of illness  Consultants: Orthopedics  Procedures: None  Antimicrobials: None   Subjective: Patient seen and examined at bedside.  Poor  historian.  Complains of back pain.  No fever, agitation, vomiting reported.  Objective: Vitals:   04/11/24 1328 04/11/24 2003 04/12/24 0528 04/12/24 0648  BP: (!) 150/53 (!) 145/62 (!) 149/54 (!) (P) 145/57  Pulse: 83 75 87 (P) 92  Resp: 17 18  (P) 16  Temp: 98 F (36.7 C) 98.1 F (36.7 C) 98.2 F (36.8 C) (P) 98.6 F (37 C)  TempSrc:  Oral Oral (P) Oral  SpO2: 96% 95% 98% (P) 95%  Weight:    (P) 48 kg  Height:    (P) 4' 11 (1.499 m)    Intake/Output Summary (Last 24 hours) at 04/12/2024 0835 Last data filed at 04/12/2024 0500 Gross per 24 hour  Intake --  Output 1950 ml  Net -1950 ml   Filed Weights   04/10/24 1711 04/12/24 0648  Weight: 48 kg (P) 48 kg    Examination:  General exam: Appears calm and comfortable.  Elderly female lying in bed. Respiratory system: Bilateral decreased breath sounds at bases, no wheezing Cardiovascular system: S1 & S2 heard, Rate controlled Gastrointestinal system: Abdomen is nondistended, soft and nontender. Normal bowel sounds heard. Extremities: No cyanosis, clubbing, edema  Central nervous system: Slow to respond.  Poor historian.  No focal neurological deficits. Moving extremities Skin: No rashes, lesions or ulcers Psychiatry: Flat affect.  Not agitated    Data Reviewed: I have personally reviewed following labs and imaging studies  CBC: Recent Labs  Lab 04/10/24 1715 04/11/24 0329  WBC 7.9 13.7*  HGB 12.6 11.8*  HCT 37.3 35.0*  MCV 92.1 92.1  PLT 249 172   Basic Metabolic Panel: Recent Labs  Lab 04/10/24 1715 04/11/24 0329  NA 140 135  K 3.6 4.6  CL 104 103  CO2 24 24  GLUCOSE 175* 147*  BUN 25* 23  CREATININE 0.90 0.86  CALCIUM 9.8 9.1  MG 2.3  --    GFR: Estimated Creatinine Clearance: 28.5 mL/min (by C-G formula based on SCr of 0.86 mg/dL). Liver Function Tests: Recent Labs  Lab 04/10/24 1715  AST 29  ALT 25  ALKPHOS 24*  BILITOT 0.4  PROT 6.7  ALBUMIN 4.0   No results for input(s): LIPASE,  AMYLASE in the last 168 hours. No results for input(s): AMMONIA in the last 168 hours. Coagulation Profile: No results for input(s): INR, PROTIME in the last 168 hours. Cardiac Enzymes: No results for input(s): CKTOTAL, CKMB, CKMBINDEX, TROPONINI in the last 168 hours. BNP (last 3 results) No results for input(s): PROBNP in the last 8760 hours. HbA1C: No results for input(s): HGBA1C in the last 72 hours. CBG: No results for input(s): GLUCAP in the last 168 hours. Lipid Profile: No results for input(s): CHOL, HDL, LDLCALC, TRIG, CHOLHDL, LDLDIRECT in the last 72 hours. Thyroid  Function Tests: No results for input(s): TSH, T4TOTAL, FREET4, T3FREE, THYROIDAB in the last 72 hours. Anemia Panel: No results for input(s): VITAMINB12, FOLATE, FERRITIN, TIBC, IRON, RETICCTPCT in the last 72 hours. Sepsis Labs: No results for input(s): PROCALCITON, LATICACIDVEN in the last 168 hours.  Recent Results (from the past 240 hours)  Surgical pcr screen     Status: None   Collection Time: 04/11/24  6:10 AM   Specimen: Nasal Mucosa; Nasal Swab  Result Value Ref Range Status   MRSA, PCR NEGATIVE NEGATIVE Final   Staphylococcus aureus NEGATIVE NEGATIVE Final    Comment: (NOTE) The Xpert SA Assay (FDA approved for NASAL specimens in patients 37 years of age and older), is one component of a comprehensive surveillance program. It is not intended to diagnose infection nor to guide or monitor treatment. Performed at [facility], [address], [place], [place] [address]   Surgical PCR screen     Status: None   Collection Time: 04/12/24 12:00 AM   Specimen: Nasal Mucosa; Nasal Swab  Result Value Ref Range Status   MRSA, PCR NEGATIVE NEGATIVE Final   Staphylococcus aureus NEGATIVE NEGATIVE Final    Comment: (NOTE) The Xpert SA Assay (FDA approved for NASAL specimens in patients 48 years of age and older), is one  component of a comprehensive surveillance program. It is not intended to diagnose infection nor to guide or monitor treatment. Performed at [facility], [address] [place], [place] [address]          Radiology Studies: CT Angio Aortobifemoral W and/or Wo Contrast Result Date: 04/11/2024 CLINICAL DATA:  Acute bilateral displaced comminuted sacral ala fractures extending to the SI joints and S1 and S2 neural foramina. There is associated pelvic hemorrhage. We are evaluating for arterial injury. EXAM: CT ANGIOGRAPHY OF ABDOMINAL AORTA WITH ILIOFEMORAL RUNOFF TECHNIQUE: Multidetector CT imaging of the abdomen, pelvis and lower extremities was performed using the standard protocol during bolus administration of intravenous contrast. Multiplanar CT image reconstructions and MIPs were obtained to evaluate the vascular anatomy. RADIATION DOSE REDUCTION: This exam was performed according to the departmental dose-optimization program which includes automated exposure control, adjustment of the mA and/or kV according to patient size and/or use of iterative reconstruction technique. CONTRAST:  OMNIPAQUE  IOHEXOL  350 MG/ML SOLN COMPARISON:  CT pelvis without contrast yesterday, CT abdomen and pelvis with IV contrast 01/13/2022. FINDINGS: VASCULAR Aorta: Moderate to heavy patchy calcific plaque. No stenosis, dissection or aneurysm. Celiac: There is a 75% vessel origin  stenosis, partly due to ostial calcific plaques and partly due to compression by the median arcuate ligament of the diaphragm. There is no poststenotic dilatation. The vessel and its branches otherwise opacified well. The branching appears standard. No visible branch occlusion. SMA: Patent without evidence of aneurysm, dissection, vasculitis or significant stenosis. There are nonstenosing ostial calcific plaques along the superior wall. Renals: Both are single, both demonstrate moderate ostial calcific plaques. The calcifications are  non stenosing on the left and on the right, the calcifications cause a 50% vessel origin stenosis. No other stenoses are seen.  No branch occlusions. IMA: There is an 80% calcific vessel origin stenosis. The vessel otherwise opacifies well. RIGHT Lower Extremity Inflow: No inflow arterial contrast extravasation is seen. There are scattered calcific plaques in the common iliac and internal iliac arteries. The external iliac artery is tortuous but plaque free. The inflow vessels do not show flow-limiting narrowing. Outflow: Common, superficial and profunda femoral arteries and the popliteal artery are patent without evidence of aneurysm, dissection, vasculitis or significant stenosis. There are mild scattered nonstenosing calcific plaques in the superficial femoral and popliteal artery. Runoff: Patent three vessel runoff to the ankle. There are mild scattered nonstenosing calcific plaques in the tibioperoneal trunk and anterior tibial artery. LEFT Lower Extremity Inflow: Common, internal and external iliac arteries are patent without evidence of aneurysm, dissection, vasculitis or significant stenosis. No inflow arterial contrast extravasation is seen. Small amount of scattered calcific plaque in the distal common iliac and internal iliac arteries. Outflow: Common, superficial and profunda femoral arteries and the popliteal artery are patent without evidence of aneurysm, dissection, vasculitis or significant stenosis. There are mild scattered nonstenosing calcific plaques in the superficial femoral artery. Runoff: The proximal anterior tibial artery is moderately irregularly stenotic, with scattered plaque and thrombosis. It occludes in the lower calf region and then reconstitutes again just above the transition to the dorsalis pedis. The peroneal and posterior tibial arteries both runoff into the foot. Veins: No obvious venous abnormality within the limitations of this arterial phase study. Review of the MIP images  confirms the above findings. NON-VASCULAR Lower chest: The cardiac size is upper limit of normal. There is calcification in the right coronary artery. There is no substantial pericardial effusion. The lung bases clear. Hepatobiliary: No focal liver abnormality is seen. Stable post cholecystectomy common bile duct dilatation up to 12 mm is again noted. No visible filling defect. Pancreas: There is mild ductal dilatation without mass enhancement or inflammatory change. Spleen: No abnormality. Adrenals/Urinary Tract: There is no adrenal or renal mass enhancement. There is no urinary stone or obstruction. The bladder is unremarkable. Stomach/Bowel:  Negative stomach.  Negative unopacified small bowel. Evidence of previous ileocecectomy. Mild fecal stasis. No bowel obstruction or inflammation. Lymphatic: No adenopathy is seen. Reproductive: Vascular calcifications in the uterine wall. Uterus and ovaries are not enlarged. Other: Moderate retroperitoneal hemorrhage tracking along the distal paracolic gutters collecting in posterior deep pelvis, increased from yesterday's CT. Presumably venous blood since no arterial extravasation is identified. Numerous pelvic phleboliths. Musculoskeletal: Comminuted sacral ala fractures are again noted as described previously. No other regional skeletal fracture is seen. Osteopenia and degenerative change lumbar spine. IMPRESSION: VASCULAR 1. No inflow arterial contrast extravasation bilaterally. 2. No aortic branch arterial occlusions are seen. 3. Atherosclerosis. 4. 75% celiac artery origin stenosis, partly due to ostial calcific plaques and partly due to compression by the median arcuate ligament of the diaphragm. 5. 80% IMA calcific vessel origin stenosis. 6. 50% right renal  artery origin stenosis. 7. LEFT lower extremity runoff: The proximal anterior tibial artery is moderately irregularly stenotic, with scattered plaque and thrombosis. It occludes in the lower calf region and then  reconstitutes again just above the transition to the dorsalis pedis. The peroneal and posterior tibial arteries both runoff into the foot. 8. RIGHT lower extremity runoff: Patent three-vessel runoff with mild atherosclerotic plaques. NON-VASCULAR 1. Moderate retroperitoneal hemorrhage in the distal paracolic gutters and posterior deep pelvis increased from yesterday's CT. Presumably venous blood since no arterial contrast extravasation is seen. 2. Comminuted sacral ala fractures as described previously. 3. Coronary artery disease. 4. Constipation. 5. Stable post cholecystectomy common bile duct dilatation up to 12 mm. 6. Mild pancreatic ductal dilatation without mass enhancement or inflammatory change. 7. Osteopenia and degenerative change. Aortic Atherosclerosis (ICD10-I70.0). Electronically Signed   By: Francis Quam M.D.   On: 04/11/2024 03:23   CT PELVIS WO CONTRAST Result Date: 04/10/2024 CLINICAL DATA:  These results were communicated via [email or web address] application on 04/10/2024 at 7:52 pm. EXAM: CT PELVIS WITHOUT CONTRAST TECHNIQUE: Multidetector CT imaging of the pelvis was performed following the standard protocol without intravenous contrast. RADIATION DOSE REDUCTION: This exam was performed according to the departmental dose-optimization program which includes automated exposure control, adjustment of the mA and/or kV according to patient size and/or use of iterative reconstruction technique. COMPARISON:  CT lumbar spine 04/10/2024. FINDINGS: Urinary Tract:  No abnormality visualized. Bowel: Bowel resection. Colonic diverticulosis. Unremarkable visualized pelvic bowel loops. Vascular/Lymphatic: Atherosclerotic plaque. No pathologically enlarged lymph nodes. No significant vascular abnormality seen. Reproductive:  No mass or other significant abnormality Other: Marked bilateral, left greater than right, presacral and pelvic sidewall fat stranding and hematoma formation. Associated left external iliac  vasculature injury not excluded with markedly limited evaluation on this noncontrast study (4:44). No intraperitoneal free fluid. No intraperitoneal free gas. No organized fluid collection. Musculoskeletal: Acute bilateral displaced and comminuted sacral ala fractures extending to the sacroiliac joints as well as S1 and S2 osseous neural foramina. No acute displaced fracture or dislocation of either hips. Slightly asymmetric left iliopsoas muscles with underlying hematoma not excluded. Please see separately dictated CT lumbar spine 04/10/2024. IMPRESSION: 1. Acute bilateral displaced and comminuted sacral ala fractures extending to the sacroiliac joints as well as S1 and S2 osseous neural foramina. 2. Marked bilateral, left greater than right, presacral and pelvic sidewall fat stranding and hematoma formation. Concern for possible underlying left external iliac vasculature injury-markedly limited evaluation on this noncontrast study. 3.  Aortic Atherosclerosis (ICD10-I70.0). 4. Slightly asymmetric left iliopsoas muscles with underlying hematoma not excluded. 5. Please see separately dictated CT lumbar spine 04/10/2024. Electronically Signed   By: Morgane  Naveau M.D.   On: 04/10/2024 20:01   CT Lumbar Spine Wo Contrast Result Date: 04/10/2024 CLINICAL DATA:  Back trauma, no prior imaging (Age >= 16y) Pt reports a fall earlier today in her kitchen, C/O left hip pain. EXAM: CT LUMBAR SPINE WITHOUT CONTRAST TECHNIQUE: Multidetector CT imaging of the lumbar spine was performed without intravenous contrast administration. Multiplanar CT image reconstructions were also generated. RADIATION DOSE REDUCTION: This exam was performed according to the departmental dose-optimization program which includes automated exposure control, adjustment of the mA and/or kV according to patient size and/or use of iterative reconstruction technique. COMPARISON:  CT abdomen pelvis 01/13/2022, CT pelvis 04/10/2024 FINDINGS: Segmentation: 5  lumbar type vertebrae. Alignment: Grade 1 anterolisthesis of L4 on L5 and L5 on S1. Vertebrae: Diffusely decreased bone density. Multilevel mild degenerative changes of the spine.  Multilevel disc bulge. Acute minimally displaced left L5 transverse process fractures. Similar-appearing chronic mild L5 vertebral body height loss. No aggressive appearing focal pathologic process. Paraspinal and other soft tissues: Negative. Disc levels: Maintained. Other: Sacral fracture noted, please see separately dictated CT pelvis 04/10/2024. Atherosclerotic plaque of the aorta. Fat stranding along the pelvic sidewalls partially visualized. IMPRESSION: 1. Acute minimally displaced left L5 transverse process fractures. 2. Sacral fracture noted, please see separately dictated CT pelvis 04/10/2024. Electronically Signed   By: Morgane  Naveau M.D.   On: 04/10/2024 19:52   CT HEAD WO CONTRAST Result Date: 04/10/2024 CLINICAL DATA:  Head trauma, moderate-severe; Polytrauma, blunt EXAM: CT HEAD WITHOUT CONTRAST CT CERVICAL SPINE WITHOUT CONTRAST TECHNIQUE: Multidetector CT imaging of the head and cervical spine was performed following the standard protocol without intravenous contrast. Multiplanar CT image reconstructions of the cervical spine were also generated. RADIATION DOSE REDUCTION: This exam was performed according to the departmental dose-optimization program which includes automated exposure control, adjustment of the mA and/or kV according to patient size and/or use of iterative reconstruction technique. COMPARISON:  None Available. FINDINGS: CT HEAD FINDINGS Brain: No evidence of large-territorial acute infarction. No parenchymal hemorrhage. No mass lesion. No extra-axial collection. No mass effect or midline shift. No hydrocephalus. Basilar cisterns are patent. Vascular: No hyperdense vessel. Atherosclerotic calcifications are present within the cavernous internal carotid and vertebral arteries. Skull: No acute fracture or  focal lesion. Sinuses/Orbits: Paranasal sinuses and mastoid air cells are clear. The orbits are unremarkable. Other: None. CT CERVICAL SPINE FINDINGS Alignment: Grade 1 anterolisthesis of C2 on C3, C3 on C4, C4 on C5, C5 on C6. Skull base and vertebrae: Multilevel degenerative change of the spine with severe osseous neural foraminal stenosis at the C3-C4 level on the left. No acute fracture. No aggressive appearing focal osseous lesion or focal pathologic process. Soft tissues and spinal canal: No prevertebral fluid or swelling. No visible canal hematoma. Upper chest: Unremarkable. Other: None. IMPRESSION: 1. No acute intracranial abnormality. 2. No acute displaced fracture or traumatic listhesis of the cervical spine. Electronically Signed   By: Morgane  Naveau M.D.   On: 04/10/2024 19:46   CT CERVICAL SPINE WO CONTRAST Result Date: 04/10/2024 CLINICAL DATA:  Head trauma, moderate-severe; Polytrauma, blunt EXAM: CT HEAD WITHOUT CONTRAST CT CERVICAL SPINE WITHOUT CONTRAST TECHNIQUE: Multidetector CT imaging of the head and cervical spine was performed following the standard protocol without intravenous contrast. Multiplanar CT image reconstructions of the cervical spine were also generated. RADIATION DOSE REDUCTION: This exam was performed according to the departmental dose-optimization program which includes automated exposure control, adjustment of the mA and/or kV according to patient size and/or use of iterative reconstruction technique. COMPARISON:  None Available. FINDINGS: CT HEAD FINDINGS Brain: No evidence of large-territorial acute infarction. No parenchymal hemorrhage. No mass lesion. No extra-axial collection. No mass effect or midline shift. No hydrocephalus. Basilar cisterns are patent. Vascular: No hyperdense vessel. Atherosclerotic calcifications are present within the cavernous internal carotid and vertebral arteries. Skull: No acute fracture or focal lesion. Sinuses/Orbits: Paranasal sinuses and  mastoid air cells are clear. The orbits are unremarkable. Other: None. CT CERVICAL SPINE FINDINGS Alignment: Grade 1 anterolisthesis of C2 on C3, C3 on C4, C4 on C5, C5 on C6. Skull base and vertebrae: Multilevel degenerative change of the spine with severe osseous neural foraminal stenosis at the C3-C4 level on the left. No acute fracture. No aggressive appearing focal osseous lesion or focal pathologic process. Soft tissues and spinal canal: No prevertebral fluid or  swelling. No visible canal hematoma. Upper chest: Unremarkable. Other: None. IMPRESSION: 1. No acute intracranial abnormality. 2. No acute displaced fracture or traumatic listhesis of the cervical spine. Electronically Signed   By: Morgane  Naveau M.D.   On: 04/10/2024 19:46   DG Pelvis Portable Result Date: 04/10/2024 CLINICAL DATA:  Trauma, fall. EXAM: PORTABLE PELVIS 1-2 VIEWS COMPARISON:  None Available. FINDINGS: There is no evidence of pelvic fracture or diastasis. No pelvic bone lesions are seen. Surgical staple line is seen in the right abdomen. IMPRESSION: Negative. Electronically Signed   By: Greig Pique M.D.   On: 04/10/2024 18:01   DG Chest Port 1 View Result Date: 04/10/2024 CLINICAL DATA:  Trauma EXAM: PORTABLE CHEST 1 VIEW COMPARISON:  Chest x-ray 06/01/2022 FINDINGS: The heart size and mediastinal contours are within normal limits. There are calcified nodules in the right lower lung, new from prior. The lungs are otherwise clear. There is no pleural effusion or pneumothorax. The visualized skeletal structures are unremarkable. IMPRESSION: 1. No active disease. 2. New calcified right lower lobe nodules. Electronically Signed   By: Greig Pique M.D.   On: 04/10/2024 17:55        Scheduled Meds:  [MAR Hold] acetaminophen   1,000 mg Oral TID   [MAR Hold] vitamin B-12  1,000 mcg Oral Daily   [MAR Hold] levothyroxine   137 mcg Oral Q0600   [MAR Hold] lidocaine   1 patch Transdermal Q24H   [MAR Hold] LORazepam   0.5 mg Oral  QHS   [MAR Hold] mupirocin  ointment  1 Application Nasal BID   Continuous Infusions:  lactated ringers  10 mL/hr at 04/12/24 0743          Sophie Mao, MD [facility] 04/12/2024, 8:35 AM

## 2024-04-12 NOTE — Anesthesia Postprocedure Evaluation (Signed)
 Anesthesia Post Note  Patient: Danielle Proctor  Procedure(s) Performed: CLOSED REDUCTION, PELVIS, WITH PERCUTANEOUS FIXATION (Bilateral)     Patient location during evaluation: PACU Anesthesia Type: General Level of consciousness: awake and alert Pain management: pain level controlled Vital Signs Assessment: post-procedure vital signs reviewed and stable Respiratory status: spontaneous breathing, nonlabored ventilation, respiratory function stable and patient connected to nasal cannula oxygen Cardiovascular status: blood pressure returned to baseline and stable Postop Assessment: no apparent nausea or vomiting Anesthetic complications: no  No notable events documented.  Last Vitals:  Vitals:   04/12/24 0930 04/12/24 0945  BP:  (!) 150/63  Pulse: 95 96  Resp: 14 14  Temp: (!) 36.3 C 36.9 C  SpO2: 94% 92%    Last Pain:  Vitals:   04/12/24 0953  TempSrc:   PainSc: 0-No pain                 Epifanio Lamar BRAVO

## 2024-04-12 NOTE — Progress Notes (Signed)
 PT Cancellation Note  Patient Details Name: SANTANNA WHITFORD MRN: [id number] DOB: 1929/10/08   Cancelled Treatment:    Reason Eval/Treat Not Completed: Patient at procedure or test/unavailable. Pt currently in OR. Will check back as schedule allows to initiate PT evaluation.    Leita JONETTA Sable 04/12/2024, 6:58 AM  Leita Sable, PT, DPT Acute Rehabilitation Services Secure Chat Preferred Office: [phone]

## 2024-04-12 NOTE — Anesthesia Procedure Notes (Signed)
 Procedure Name: Intubation Date/Time: 04/12/2024 7:58 AM  Performed by: Alen Motto D, CRNAPre-anesthesia Checklist: Patient identified, Emergency Drugs available, Suction available and Patient being monitored Patient Re-evaluated:Patient Re-evaluated prior to induction Oxygen Delivery Method: Circle System Utilized Preoxygenation: Pre-oxygenation with 100% oxygen Induction Type: IV induction Ventilation: Mask ventilation without difficulty Laryngoscope Size: Mac and 3 Grade View: Grade II Tube type: Oral Tube size: 7.0 mm Number of attempts: 1 Airway Equipment and Method: Stylet and Oral airway Placement Confirmation: ETT inserted through vocal cords under direct vision, positive ETCO2 and breath sounds checked- equal and bilateral Secured at: 20 cm Tube secured with: Tape Dental Injury: Teeth and Oropharynx as per pre-operative assessment

## 2024-04-12 NOTE — Op Note (Signed)
 Orthopaedic Surgery Operative Note (CSN: [id number] ) Date of Surgery: 04/12/2024  Admit Date: 04/10/2024   Diagnoses: Pre-Op Diagnoses: Bilateral sacral insufficiency fractures  Post-Op Diagnosis: Same  Procedures: CPT 27216x2-Percutaneous fixation of bilateral sacral fractures  Surgeons : Primary: Kendal Franky SQUIBB, MD  Assistant: Jon Hurst, RNFA  Location: OR 3   Anesthesia: General   Antibiotics: Ancef  2g preop    Tourniquet time: None    Estimated Blood Loss: Minimal  Complications:* No complications entered in OR log *   Specimens:* No specimens in log *   Implants: Implant Name Type Inv. Item Serial No. Manufacturer Lot No. LRB No. Used Action  WASHER F/7.5 CANN SCREW - [id number] Washer WASHER F/7.5 CANN SCREW  [employer]  Left 1 Implanted  SCREW CANN FT 7.5X155 STL - [id number] Screw SCREW CANN FT 7.5X155 STL  [employer] [id number] Left 1 Implanted     Indications for Surgery: 89 year old female who sustained a fall on her bottom sustaining a U-type sacral insufficiency fracture.  Due to the potential of nonhealing as well as limited mobilization I recommend proceeding with percutaneous fixation.  Risks and benefits were discussed with the patient and her son.  Risks include but not limited to bleeding, infection, malunion, nonunion, hardware failure, hardware rotation, nerve and blood vessel injury, continued pain, DVT, even the possible anesthetic complications.  They agreed to proceed with surgery and consent was obtained.  Operative Findings: 1.  Percutaneous fixation of bilateral sacral insufficiency fractures using Synthes 7.5 mm fully threaded cannulated screw with a washer in the S1 transsacral transiliac corridor  Procedure: The patient was identified in the preoperative holding area. Consent was confirmed with the patient and their family and all questions were answered. The operative extremity was marked after confirmation with the patient.  she was then brought back to the operating room by our anesthesia colleagues.  She was placed under general anesthetic and carefully transferred over to radiolucent flattop table.  Sacral bump was placed under pelvis to elevated to allow for appropriate positioning of the screws.  Fluoroscopic imaging was obtained to show the fracture.  The pelvis was then prepped and draped in usual sterile fashion.  A timeout was performed to verify the patient, the procedure, and the extremity.  Preoperative antibiotics were dosed.  Using inlet and outlet views I identified appropriate starting point with a percutaneous 2.0 mm guidewire.  I advanced it into the bone approximately 1 cm.  I cut down on this with an 11 blade.  I then used a 4.5 mm cannulated drill bit to oscillate across the bone for a transsacral transiliac screw at S1.  I advanced the 4.5 mm cannulated drill bit until I reached the far neuroforamen.  I then removed the drill bit and passed a 2.8 mm guidewire across the right SI joint and right lateral ilium.  I then measured the length and chose to use a 155 mm fully threaded 7.5 mm cannulated screw with a washer.  Placed the screw and excellent fixation was obtained.  Final fluoroscopic imaging was obtained.  The incision was copiously irrigated.  A layer closure of 3-0 Monocryl and Dermabond was used to close the skin.  Sterile dressings were applied.  The patient was then awoke from anesthesia and taken to the PACU in stable condition.  Post Op Plan/Instructions: The patient be weightbearing as tolerated to bilateral lower extremities.  She will receive postoperative Ancef .  She will be placed on Lovenox for DVT prophylaxis and discharged on  aspirin.  Will have her mobilize with physical and Occupational Therapy.  I was present and performed the entire surgery.  Franky Light, MD [facility]

## 2024-04-12 NOTE — Interval H&P Note (Signed)
 History and Physical Interval Note:  04/12/2024 7:34 AM  Danielle Proctor  has presented today for surgery, with the diagnosis of Sacral insuffiency fractures.  The various methods of treatment have been discussed with the patient and family. After consideration of risks, benefits and other options for treatment, the patient has consented to  Procedure(s): CLOSED REDUCTION, PELVIS, WITH PERCUTANEOUS FIXATION (Bilateral) as a surgical intervention.  The patient's history has been reviewed, patient examined, no change in status, stable for surgery.  I have reviewed the patient's chart and labs.  Questions were answered to the patient's satisfaction.     Karren Newland P Abrey Bradway

## 2024-04-12 NOTE — Evaluation (Signed)
 Physical Therapy Evaluation Patient Details Name: Danielle Proctor MRN: [id number] DOB: 04-10-1929 Today's Date: 04/12/2024  History of Present Illness  Danielle Proctor is an 89 y.o. female who presented to the ED 6/25 following fall.  CT pelvis showed acute bilateral displaced/comminuted sacral alla fractures and likely hematoma formation in the presacral and pelvic sidewall. Pt s/p closed reduction of B pelvis with percutaneous fixation 6/27. PMHx: osteoporosis, mild cognitive impairment, IBS, HTN, and HLD.   Clinical Impression  Pt admitted with above diagnosis. PTA, pt was independent with functional mobility, ADLs, and IADLs. She lives with her friend in a one story house with 2 STE and unilateral handrail. Pt has very supportive neighbors and her son's have discussed splitting the time and staying with her initially. Pt currently with functional limitations due to the deficits listed below (see PT Problem List). She required CGA for safety/stability throughout session. Pt performed 3 sit<>stands and ambulated ~17ft. She needs to practice stairs next session. Pt will benefit from acute skilled PT to increase her independence and safety with mobility to allow discharge. Recommend HHPT to increase activity tolerance, improve balance, decrease fall risk, and optimize safety within the home environment.\      If plan is discharge home, recommend the following: A little help with walking and/or transfers;A little help with bathing/dressing/bathroom;Assistance with cooking/housework;Assist for transportation;Help with stairs or ramp for entrance   Can travel by private vehicle        Equipment Recommendations Rolling walker (2 wheels);Other (comment) (grab bars in her bathroom)  Recommendations for Other Services       Functional Status Assessment Patient has had a recent decline in their functional status and demonstrates the ability to make significant improvements in function in a  reasonable and predictable amount of time.     Precautions / Restrictions Precautions Precautions: Fall Recall of Precautions/Restrictions: Intact Restrictions Weight Bearing Restrictions Per Provider Order: No      Mobility  Bed Mobility               General bed mobility comments: Not assessed. Pt greeted seated in recliner chair and returned there at end of session.    Transfers Overall transfer level: Needs assistance Equipment used: Rolling walker (2 wheels) Transfers: Sit to/from Stand Sit to Stand: Contact guard assist           General transfer comment: Pt performed three sit<>stands from recliner chair. Cues for proper hand placement using RW. Pt initially maintained BUE support on RW. Practiced BUE support pushing from armrests as well as one UE up and the other UE down. Pt c/o pain with other two methods and took increased time transitioning UE support onto RW. CGA for stability and safety. Good eccentric control.    Ambulation/Gait Ambulation/Gait assistance: Contact guard assist Gait Distance (Feet): 25 Feet Assistive device: Rolling walker (2 wheels) Gait Pattern/deviations: Step-through pattern, Decreased step length - right, Decreased step length - left, Decreased stride length Gait velocity: decreased     General Gait Details: Pt ambulated with short slow steps. She demonstrated adequate foot clearence and good proximity to RW. Cued increased WBing into BUE support on RW to offload LEs. No LOB.  Stairs            Wheelchair Mobility     Tilt Bed    Modified Rankin (Stroke Patients Only)       Balance Overall balance assessment: Mild deficits observed, not formally tested  Pertinent Vitals/Pain Pain Assessment Pain Assessment: Faces Faces Pain Scale: Hurts even more Pain Location: L hip and low back Pain Descriptors / Indicators: Discomfort, Aching Pain Intervention(s):  Monitored during session, Repositioned, Patient requesting pain meds-RN notified    Home Living Family/patient expects to be discharged to:: Private residence Living Arrangements: Non-relatives/Friends Available Help at Discharge: Friend(s);Available 24 hours/day;Family;Neighbor;Available PRN/intermittently (Friend who she lives with could provide 24/7 supervision/assist. Neighbors could check in and assist daily. Pt's sons have discussed staying at home with her initially.) Type of Home: House Home Access: Stairs to enter Entrance Stairs-Rails: Right Entrance Stairs-Number of Steps: 2   Home Layout: One level Home Equipment: None Additional Comments: Pt lives with friend, has neighbors who can assist daily    Prior Function Prior Level of Function : Independent/Modified Independent             Mobility Comments: Indep without AD. 1 fall that led to current admission in the past 13mo. Active lifestyle where she walks 0.109mi on good weather days. ADLs Comments: Indep with ADLs/IADLs. Pt reports she likes to bath in the tub and will pull on a towel rod to help herself get out/in and lift each leg over the lip of the tub.     Extremity/Trunk Assessment   Upper Extremity Assessment Upper Extremity Assessment: Defer to OT evaluation    Lower Extremity Assessment Lower Extremity Assessment: Overall WFL for tasks assessed    Cervical / Trunk Assessment Cervical / Trunk Assessment: Normal  Communication   Communication Communication: Impaired Factors Affecting Communication: Hearing impaired    Cognition Arousal: Alert Behavior During Therapy: WFL for tasks assessed/performed   PT - Cognitive impairments: History of cognitive impairments                         Following commands: Impaired (likely secondary to impaired hearing) Following commands impaired: Follows one step commands inconsistently, Follows one step commands with increased time, Follows multi-step  commands inconsistently, Follows multi-step commands with increased time     Cueing Cueing Techniques: Verbal cues, Visual cues, Tactile cues     General Comments General comments (skin integrity, edema, etc.): VSS on RA. Son present and supportive throughout session.    Exercises     Assessment/Plan    PT Assessment Patient needs continued PT services  PT Problem List Decreased activity tolerance;Decreased balance;Decreased mobility       PT Treatment Interventions DME instruction;Gait training;Stair training;Functional mobility training;Therapeutic activities;Balance training;Therapeutic exercise;Patient/family education    PT Goals (Current goals can be found in the Care Plan section)  Acute Rehab PT Goals Patient Stated Goal: Return Home and keep moving PT Goal Formulation: With patient/family Time For Goal Achievement: 04/26/24 Potential to Achieve Goals: Good    Frequency Min 2X/week     Co-evaluation               AM-PAC PT 6 Clicks Mobility  Outcome Measure Help needed turning from your back to your side while in a flat bed without using bedrails?: A Little Help needed moving from lying on your back to sitting on the side of a flat bed without using bedrails?: A Little Help needed moving to and from a bed to a chair (including a wheelchair)?: A Little Help needed standing up from a chair using your arms (e.g., wheelchair or bedside chair)?: A Little Help needed to walk in hospital room?: A Little Help needed climbing 3-5 steps with a railing? : A Lot  6 Click Score: 17    End of Session Equipment Utilized During Treatment: Gait belt Activity Tolerance: Patient tolerated treatment well Patient left: in chair;with call bell/phone within reach;with chair alarm set;with nursing/sitter in room Nurse Communication: Mobility status;Patient requests pain meds PT Visit Diagnosis: Difficulty in walking, not elsewhere classified (R26.2);Other abnormalities of gait  and mobility (R26.89);Unsteadiness on feet (R26.81)    Time: 8399-8375 PT Time Calculation (min) (ACUTE ONLY): 24 min   Charges:   PT Evaluation $PT Eval Low Complexity: 1 Low PT Treatments $Gait Training: 8-22 mins PT General Charges $$ ACUTE PT VISIT: 1 Visit         Randall SAUNDERS, PT, DPT Acute Rehabilitation Services Office: [phone] Secure Chat Preferred  Delon CHRISTELLA Callander 04/12/2024, 5:06 PM

## 2024-04-12 NOTE — Anesthesia Preprocedure Evaluation (Signed)
 Anesthesia Evaluation  Patient identified by MRN, date of birth, ID band Patient awake and Patient confused    Reviewed: Allergy & Precautions, NPO status , Patient's Chart, lab work & pertinent test results  Airway Mallampati: II  TM Distance: >3 FB Neck ROM: Full    Dental  (+) Dental Advisory Given   Pulmonary neg pulmonary ROS   Pulmonary exam normal        Cardiovascular hypertension, + Peripheral Vascular Disease   Rhythm:Regular Rate:Normal     Neuro/Psych negative neurological ROS     GI/Hepatic negative GI ROS, Neg liver ROS,,,  Endo/Other  Hypothyroidism    Renal/GU negative Renal ROS     Musculoskeletal  (+) Arthritis ,    Abdominal   Peds  Hematology negative hematology ROS (+)   Anesthesia Other Findings   Reproductive/Obstetrics                             Anesthesia Physical Anesthesia Plan  ASA: 3  Anesthesia Plan: General   Post-op Pain Management: Ofirmev  IV (intra-op)*   Induction: Intravenous  PONV Risk Score and Plan: 3 and Dexamethasone, Ondansetron , Treatment may vary due to age or medical condition, Propofol infusion and TIVA  Airway Management Planned: Oral ETT  Additional Equipment: None  Intra-op Plan:   Post-operative Plan: Extubation in OR  Informed Consent: I have reviewed the patients History and Physical, chart, labs and discussed the procedure including the risks, benefits and alternatives for the proposed anesthesia with the patient or authorized representative who has indicated his/her understanding and acceptance.     Dental advisory given  Plan Discussed with: CRNA  Anesthesia Plan Comments:        Anesthesia Quick Evaluation

## 2024-04-12 NOTE — TOC CAGE-AID Note (Signed)
 Transition of Care [facility]) - CAGE-AID Screening   Patient Details  Name: Danielle Proctor MRN: [id number] Date of Birth: [birth year]  [facility] CM/SW Contact:    Eugene Isadore E Cameka Rae, LCSW Phone Number: 04/12/2024, 9:59 AM   Clinical Narrative: Disoriented x 3. No SA noted per chart review.   CAGE-AID Screening: Substance Abuse Screening unable to be completed due to: : Patient unable to participate

## 2024-04-12 NOTE — Evaluation (Signed)
 Occupational Therapy Evaluation Patient Details Name: Danielle Proctor MRN: [id number] DOB: 10-26-1928 Today's Date: 04/12/2024   History of Present Illness   89 year old female presented after a fall from home along with lower back pain. S/p 6/27 Percutaneous fixation of bilateral sacral fractures, with history of osteoporosis, mild cognitive impairment, IBS, hypertension, hyperlipidemia     Clinical Impressions Pt resting in recliner, c/o no pain at rest, 4/10 to low back with activity. Pt lives at home with friend who is available 24/7, has supportive neighbors. PLOF independent, owns no DME, has been active all her life. Pt currently doing very well, set up/supervision for ADLs and mobility, ambulated around room, has been getting to toilet and back with supervision. Hopeful that pain is well-managed as she is recently out of surgery, will check back tomorrow to ensure she maintains CLOF and pain is managed. Recommending RW for return home, spoke with Pt about getting shower chair but she seems determined to sit in tub as she normally does. Pt does have good strength and mobility and will likely do well, no follow up recommendations expected.      If plan is discharge home, recommend the following:   Assistance with cooking/housework;Assist for transportation;Help with stairs or ramp for entrance     Functional Status Assessment   Patient has had a recent decline in their functional status and demonstrates the ability to make significant improvements in function in a reasonable and predictable amount of time.     Equipment Recommendations   Other (comment) (RW)     Recommendations for Other Services         Precautions/Restrictions   Precautions Precautions: Fall Recall of Precautions/Restrictions: Intact Restrictions Weight Bearing Restrictions Per Provider Order: No     Mobility Bed Mobility               General bed mobility comments: in recliner     Transfers Overall transfer level: Needs assistance Equipment used: Rolling walker (2 wheels) Transfers: Sit to/from Stand, Bed to chair/wheelchair/BSC Sit to Stand: Supervision     Step pivot transfers: Supervision     General transfer comment: supervision with RW      Balance Overall balance assessment: Mild deficits observed, not formally tested                                         ADL either performed or assessed with clinical judgement   ADL Overall ADL's : Needs assistance/impaired                                       General ADL Comments: set up/supervision, doing well     Vision Baseline Vision/History: 1 Wears glasses Ability to See in Adequate Light: 0 Adequate Patient Visual Report: No change from baseline       Perception         Praxis         Pertinent Vitals/Pain Pain Assessment Pain Assessment: 0-10 Pain Score: 3  Pain Location: low back Pain Descriptors / Indicators: Aching Pain Intervention(s): Monitored during session     Extremity/Trunk Assessment             Communication Communication Communication: Impaired Factors Affecting Communication: Hearing impaired   Cognition Arousal: Alert Behavior During Therapy: WFL for tasks assessed/performed Cognition: Cognition  impaired   Orientation impairments: Time         OT - Cognition Comments: Per son STM is affected moderately but has been independent in home.                 Following commands: Intact       Cueing  General Comments   Cueing Techniques: Verbal cues      Exercises     Shoulder Instructions      Home Living Family/patient expects to be discharged to:: Private residence Living Arrangements: Non-relatives/Friends   Type of Home: House Home Access: Stairs to enter Secretary/administrator of Steps: 2   Home Layout: One level     Bathroom Shower/Tub: Tub/shower unit         Home Equipment: None    Additional Comments: Pt lives with friend, has neighbors who can assist daily      Prior Functioning/Environment Prior Level of Function : Independent/Modified Independent             Mobility Comments: ind      OT Problem List: Pain;Decreased cognition   OT Treatment/Interventions: Self-care/ADL training;Therapeutic exercise;Energy conservation;DME and/or AE instruction;Therapeutic activities;Patient/family education      OT Goals(Current goals can be found in the care plan section)   Acute Rehab OT Goals Patient Stated Goal: to return home OT Goal Formulation: With patient/family Time For Goal Achievement: 04/26/24 Potential to Achieve Goals: Good   OT Frequency:  Min 2X/week    Co-evaluation              AM-PAC OT 6 Clicks Daily Activity     Outcome Measure Help from another person eating meals?: None Help from another person taking care of personal grooming?: A Little Help from another person toileting, which includes using toliet, bedpan, or urinal?: A Little Help from another person bathing (including washing, rinsing, drying)?: A Little Help from another person to put on and taking off regular upper body clothing?: A Little Help from another person to put on and taking off regular lower body clothing?: A Little 6 Click Score: 19   End of Session Equipment Utilized During Treatment: Gait belt;Rolling walker (2 wheels) Nurse Communication: Mobility status  Activity Tolerance: Patient tolerated treatment well Patient left: in chair;with call bell/phone within reach  OT Visit Diagnosis: Other abnormalities of gait and mobility (R26.89);History of falling (Z91.81);Pain                Time: 8493-8468 OT Time Calculation (min): 25 min Charges:  OT General Charges $OT Visit: 1 Visit OT Evaluation $OT Eval Low Complexity: 1 Low OT Treatments $Self Care/Home Management : 8-22 mins  [place], OTR/L   Danielle Proctor 04/12/2024, 3:41 PM

## 2024-04-13 DIAGNOSIS — S3210XA Unspecified fracture of sacrum, initial encounter for closed fracture: Secondary | ICD-10-CM | POA: Diagnosis not present

## 2024-04-13 MED ORDER — SODIUM CHLORIDE 0.9 % IV SOLN
1.0000 g | Freq: Every day | INTRAVENOUS | Status: DC
  Administered 2024-04-13: 1 g via INTRAVENOUS
  Filled 2024-04-13 (×2): qty 10

## 2024-04-13 NOTE — Plan of Care (Signed)

## 2024-04-13 NOTE — Progress Notes (Signed)
 ORTHOPAEDIC PROGRESS NOTE  s/p Procedure(s): CLOSED REDUCTION, PELVIS, WITH PERCUTANEOUS FIXATION  SUBJECTIVE: Reports mild pain about operative site. Having to urinate frequently. Doing well with therapy.   No chest pain. No SOB. No nausea/vomiting. No other complaints.  OBJECTIVE: PE: General: sitting up in recliner, NAD Bilateral lower extremities: leg lengths equal, intact EHL/TA/GSC, warm well perfused foot  Vitals:   04/13/24 0419 04/13/24 0807  BP: (!) 154/75 (!) 172/72  Pulse: 89 100  Resp: 18 18  Temp: 98.7 F (37.1 C) 99.2 F (37.3 C)  SpO2: 94% 97%     ASSESSMENT: Danielle Proctor is a 89 y.o. female doing well postoperatively. POD#1  PLAN: Weightbearing: WBAT bilateral lower extremities Insicional and dressing care: Reinforce dressings as needed Orthopedic device(s): None Showering: Hold for now VTE prophylaxis: Lovenox Pain control: PRN pain medications, minimize narcotics as able Follow - up plan: 2 weeks in office with Dr. Kendal Dispo: PT recommending home health Contact information:  After hours and holidays please check [email or web address] for group call information for [facility]  Aleck Stalling, PA-C 04/13/24

## 2024-04-13 NOTE — Progress Notes (Signed)
 Occupational Therapy Treatment Patient Details Name: Danielle Proctor MRN: [id number] DOB: 09-20-1929 Today's Date: 04/13/2024   History of present illness Danielle Proctor is an 89 y.o. female who presented to the ED 6/25 following fall.  CT pelvis showed acute bilateral displaced/comminuted sacral alla fractures and likely hematoma formation in the presacral and pelvic sidewall. Pt s/p closed reduction of B pelvis with percutaneous fixation 6/27. PMHx: osteoporosis, mild cognitive impairment, IBS, HTN, and HLD.   OT comments  Pt. Seen for skilled OT treatment session with son present also. Pt. Able to ambulate in room/household distance form recliner to b.room with CGA and cues for RW management.  All aspects of toileting with CGA and cues for sequencing and safety during transfer.  LB dressing seated with set up.  Pt. Progressing well with OT goals.  Cont. With acute OT POC.        If plan is discharge home, recommend the following:  Assistance with cooking/housework;Assist for transportation;Help with stairs or ramp for entrance   Equipment Recommendations       Recommendations for Other Services      Precautions / Restrictions Precautions Precautions: Fall Recall of Precautions/Restrictions: Intact       Mobility Bed Mobility               General bed mobility comments: Not assessed. Pt greeted seated in recliner chair and returned there at end of session.    Transfers Overall transfer level: Needs assistance Equipment used: Rolling walker (2 wheels) Transfers: Sit to/from Stand, Bed to chair/wheelchair/BSC Sit to Stand: Contact guard assist     Step pivot transfers: Contact guard assist     General transfer comment: light hands on support needed throughout ambulation due to rw management and cues needed for sequencing.  pt. required redirection when rw touched any surface as she would try to move the rw vs step inside the rw and move with the rw around the  perceived obstacle.     Balance                                           ADL either performed or assessed with clinical judgement   ADL Overall ADL's : Needs assistance/impaired     Grooming: Wash/dry hands;Contact guard assist;Standing               Lower Body Dressing: Supervision/safety;Sitting/lateral leans Lower Body Dressing Details (indicate cue type and reason): pt. able to bring each BLE towards chest in seated position for don/doff socks. reviewed not bending forward when seated as this is a fall risk. pt. verbalized understanding Toilet Transfer: Contact guard assist;Cueing for sequencing;Regular Toilet;Grab bars Toilet Transfer Details (indicate cue type and reason): cues for sequencing of pivot before sitting down Toileting- Clothing Manipulation and Hygiene: Contact guard assist;Sit to/from stand       Functional mobility during ADLs: Contact guard assist General ADL Comments: reviewed use of 3n1 over commode for elevated height and use of arm rests for safety at home. son will measure and see if it will fit in b.room, also reviewed that sink/counter is directly next to toilet with safe base of support that pt could also use to push up from toilet with    Extremity/Trunk Assessment              Vision       Perception  Praxis     Communication Communication Communication: Impaired Factors Affecting Communication: Hearing impaired;Other (comment) (son requests slow pace with louder clarity of speech to allow pt time to process the information)   Cognition Arousal: Alert Behavior During Therapy: WFL for tasks assessed/performed Cognition: Cognition impaired   Orientation impairments: Time         OT - Cognition Comments: Per son STM is affected moderately but has been independent in home.                 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 Very active reports going to exercise classes regularly also     Pertinent Vitals/ Pain       Pain Assessment Pain Assessment: No/denies pain  Home Living                                          Prior Functioning/Environment              Frequency  Min 2X/week        Progress Toward Goals  OT Goals(current goals can now be found in the care plan section)  Progress towards OT goals: Progressing toward goals     Plan      Co-evaluation                 AM-PAC OT 6 Clicks Daily Activity     Outcome Measure   Help from another person eating meals?: None Help from another person taking care of personal grooming?: A Little Help from another person toileting, which includes using toliet, bedpan, or urinal?: A Little Help from another person bathing (including washing, rinsing, drying)?: A Little Help from another person to put on and taking off regular upper body clothing?: A Little Help from another person to put on and taking off regular lower body clothing?: A Little 6 Click Score: 19    End of Session Equipment Utilized During Treatment: Gait belt;Rolling walker (2 wheels)  OT Visit Diagnosis: Other abnormalities of gait and mobility (R26.89);History of falling (Z91.81);Pain   Activity Tolerance Patient tolerated treatment well   Patient Left in chair;with call bell/phone within reach;with chair alarm set   Nurse Communication          Time: [phone] OT Time Calculation (min): 19 min  Charges: OT General Charges $OT Visit: 1 Visit OT Treatments $Self Care/Home Management : 8-22 mins  Randall,  COTA/L Acute Rehabilitation [phone]   CHRISTELLA Nest Lorraine-COTA/L  04/13/2024, 9:59 AM

## 2024-04-13 NOTE — Progress Notes (Signed)
 PROGRESS NOTE    Danielle Proctor  FMW:[id number] DOB: [birth year] DOA: 04/10/2024 PCP: Clarice Nottingham, MD   Brief Narrative:  89 year old female with history of osteoporosis, mild cognitive impairment, IBS, hypertension, hyperlipidemia presented after a fall from home along with lower back pain.  On presentation, Chest x-ray showed new calcified right lower lobe nodules but no active disease.  CT head/CT cervical spine did not show any acute findings.  CT pelvis showed acute bilateral displaced/comminuted sacral alla fractures and likely hematoma formation in the presacral and pelvic sidewall .  CT lumbar spine showed acute minimally displaced left L5 transverse process fractures.  Orthopedics consulted: she was transferred to [facility].  She underwent percutaneous fixation of bilateral sacral fractures by orthopedics on 04/12/2024.  Assessment & Plan:   L5 transverse process fractures Commuted sacral ala fractures Mechanical fall Pelvic hematoma -Imaging as above.  Fall precautions.   -underwent percutaneous fixation of bilateral sacral fractures by orthopedics on 04/12/2024.  Wound care, activity, pain management as per orthopedics recommendations  -Hemoglobin stable recently.  Will not order any more blood work as of son's request - PT recommending home health PT.  Consult TOC.  Pulmonary nodules -Possible new calcified right lower lobe nodules.  Unclear if we need to investigate more on this 89 year old female.  She denies any respiratory symptoms  Hypothyroidism -Continue Synthroid   Probable vitamin B12 deficiency--continue oral supplementation  Insomnia - Continue Ativan  at bedtime  Leukocytosis - Resolved  anemia of chronic disease - From chronic illnesses.  Hemoglobin stable.  Monitor intermittently   DVT prophylaxis: SCDs Code Status: DNR Family Communication: Son at bedside Disposition Plan: Status is: Inpatient Remains inpatient appropriate because: Of  severity of illness  Consultants: Orthopedics  Procedures: As above  Antimicrobials: Perioperative   Subjective: Patient seen and examined at bedside.  No seizures, vomiting or fever reported.  Hard of hearing. Objective: Vitals:   04/12/24 1607 04/12/24 2005 04/13/24 0419 04/13/24 0807  BP: (!) 149/58 (!) 153/93 (!) 154/75 (!) 172/72  Pulse: (!) 103 (!) 102 89 100  Resp: 18 18 18 18   Temp: 98.8 F (37.1 C) 98.4 F (36.9 C) 98.7 F (37.1 C) 99.2 F (37.3 C)  TempSrc:    Oral  SpO2: 91% 95% 94% 97%  Weight:      Height:        Intake/Output Summary (Last 24 hours) at 04/13/2024 0858 Last data filed at 04/13/2024 0824 Gross per 24 hour  Intake 730 ml  Output 300 ml  Net 430 ml   Filed Weights   04/10/24 1711 04/12/24 0648  Weight: 48 kg 48 kg    Examination:  General: On room air.  No distress.  Elderly female lying in bed.  Hard of hearing. ENT/neck: No thyromegaly.  JVD is not elevated  respiratory: Decreased breath sounds at bases bilaterally with some crackles; no wheezing  CVS: S1-S2 heard, rate controlled currently Abdominal: Soft, nontender, slightly distended; no organomegaly, bowel sounds are heard Extremities: Trace lower extremity edema; no cyanosis  CNS: Still slow to respond and a poor historian.  No focal neurologic deficit.  Moves extremities Lymph: No obvious lymphadenopathy Skin: No obvious ecchymosis/lesions  psych: Affect is mostly flat.  Currently not agitated   Data Reviewed: I have personally reviewed following labs and imaging studies  CBC: Recent Labs  Lab 04/10/24 1715 04/11/24 0329 04/12/24 1004  WBC 7.9 13.7* 8.9  HGB 12.6 11.8* 10.7*  HCT 37.3 35.0* 31.7*  MCV 92.1 92.1 91.9  PLT 249 172 127*   Basic Metabolic Panel: Recent Labs  Lab 04/10/24 1715 04/11/24 0329 04/12/24 1004  NA 140 135 136  K 3.6 4.6 4.0  CL 104 103 101  CO2 24 24 28   GLUCOSE 175* 147* 131*  BUN 25* 23 18  CREATININE 0.90 0.86 0.97  CALCIUM 9.8  9.1 9.2  MG 2.3  --   --    GFR: Estimated Creatinine Clearance: 25.2 mL/min (by C-G formula based on SCr of 0.97 mg/dL). Liver Function Tests: Recent Labs  Lab 04/10/24 1715  AST 29  ALT 25  ALKPHOS 24*  BILITOT 0.4  PROT 6.7  ALBUMIN 4.0   No results for input(s): LIPASE, AMYLASE in the last 168 hours. No results for input(s): AMMONIA in the last 168 hours. Coagulation Profile: No results for input(s): INR, PROTIME in the last 168 hours. Cardiac Enzymes: No results for input(s): CKTOTAL, CKMB, CKMBINDEX, TROPONINI in the last 168 hours. BNP (last 3 results) No results for input(s): PROBNP in the last 8760 hours. HbA1C: No results for input(s): HGBA1C in the last 72 hours. CBG: No results for input(s): GLUCAP in the last 168 hours. Lipid Profile: No results for input(s): CHOL, HDL, LDLCALC, TRIG, CHOLHDL, LDLDIRECT in the last 72 hours. Thyroid  Function Tests: No results for input(s): TSH, T4TOTAL, FREET4, T3FREE, THYROIDAB in the last 72 hours. Anemia Panel: No results for input(s): VITAMINB12, FOLATE, FERRITIN, TIBC, IRON, RETICCTPCT in the last 72 hours. Sepsis Labs: No results for input(s): PROCALCITON, LATICACIDVEN in the last 168 hours.  Recent Results (from the past 240 hours)  Surgical pcr screen     Status: None   Collection Time: 04/11/24  6:10 AM   Specimen: Nasal Mucosa; Nasal Swab  Result Value Ref Range Status   MRSA, PCR NEGATIVE NEGATIVE Final   Staphylococcus aureus NEGATIVE NEGATIVE Final    Comment: (NOTE) The Xpert SA Assay (FDA approved for NASAL specimens in patients 45 years of age and older), is one component of a comprehensive surveillance program. It is not intended to diagnose infection nor to guide or monitor treatment. Performed at [facility], [address] [place], [place] [address]   Surgical PCR screen     Status: None   Collection Time: 04/12/24  12:00 AM   Specimen: Nasal Mucosa; Nasal Swab  Result Value Ref Range Status   MRSA, PCR NEGATIVE NEGATIVE Final   Staphylococcus aureus NEGATIVE NEGATIVE Final    Comment: (NOTE) The Xpert SA Assay (FDA approved for NASAL specimens in patients 44 years of age and older), is one component of a comprehensive surveillance program. It is not intended to diagnose infection nor to guide or monitor treatment. Performed at [facility], [address] [place], [place] [address]          Radiology Studies: DG Pelvis Comp Min 3V Result Date: 04/12/2024 CLINICAL DATA:  Pelvic fracture. EXAM: JUDET PELVIS - 3+ VIEW COMPARISON:  Preoperative imaging FINDINGS: Screw traverses the sacroiliac joints and sacrum. Known sacral fractures are faintly visualized. No evidence of new fracture. Enteric sutures noted in the right abdomen IMPRESSION: Screw traverses the sacroiliac joints and sacrum. Known sacral fractures are faintly visualized. Electronically Signed   By: Andrea Gasman M.D.   On: 04/12/2024 11:10   DG Pelvis Comp Min 3V Result Date: 04/12/2024 CLINICAL DATA:  Elective surgery. EXAM: JUDET PELVIS - 3+ VIEW COMPARISON:  Preoperative imaging FINDINGS: Ten intraoperative spot views of the pelvis submitted from the operating  room. Imaging obtained during screw fixation of the sacrum and sacroiliac joints. Known sacral fractures are faintly visualized. Fluoroscopy time 48 seconds. Dose 4.77 mGy. IMPRESSION: Intraoperative spot views during sacral fracture fixation. Electronically Signed   By: Andrea Gasman M.D.   On: 04/12/2024 11:10   DG C-Arm 1-60 Min-No Report Result Date: 04/12/2024 Fluoroscopy was utilized by the requesting physician.  No radiographic interpretation.        Scheduled Meds:  acetaminophen   1,000 mg Oral TID   vitamin B-12  1,000 mcg Oral Daily   enoxaparin (LOVENOX) injection  30 mg Subcutaneous Q24H   levothyroxine   137 mcg Oral Q0600   lidocaine   1 patch  Transdermal Q24H   LORazepam   0.5 mg Oral QHS   mupirocin  ointment  1 Application Nasal BID   Continuous Infusions:   ceFAZolin  (ANCEF ) IV Stopped (04/12/24 2125)          Sophie Mao, MD [facility] 04/13/2024, 8:58 AM

## 2024-04-13 NOTE — Progress Notes (Signed)
 Physical Therapy Treatment Patient Details Name: Danielle Proctor MRN: [id number] DOB: 10/27/1928 Today's Date: 04/13/2024   History of Present Illness Danielle Proctor is an 89 y.o. female who presented to the ED 6/25 following fall.  CT pelvis showed acute bilateral displaced/comminuted sacral alla fractures and likely hematoma formation in the presacral and pelvic sidewall. Pt s/p closed reduction of B pelvis with percutaneous fixation 6/27. PMHx: osteoporosis, mild cognitive impairment, IBS, HTN, and HLD.    PT Comments  Pt up in chair on arrival, pleasant and agreeable to session with good progress towards acute goals. Pt demonstrating increased activity tolerance, progressing gait distance with RW for support and grossly CGA for safety with light cues for closer RW proximity. Pt demonstrating safe stair ascent/descent with forward and sideways technique with up to min A to provide steadying assist. Pt son present and supportive throughout session. Pt was educated on continued walker use to maximize functional independence, safety, and decrease risk for falls. Anticipate safe discharge, with assist level outlined below, once medically cleared, will continue to follow acutely.     If plan is discharge home, recommend the following: A little help with walking and/or transfers;A little help with bathing/dressing/bathroom;Assistance with cooking/housework;Assist for transportation;Help with stairs or ramp for entrance   Can travel by private vehicle        Equipment Recommendations  Rolling walker (2 wheels);Other (comment) (grab bars in her bathroom)    Recommendations for Other Services       Precautions / Restrictions Precautions Precautions: Fall Recall of Precautions/Restrictions: Intact Restrictions Weight Bearing Restrictions Per Provider Order: No     Mobility  Bed Mobility               General bed mobility comments: Not assessed. Pt greeted seated in recliner  chair and returned there at end of session.    Transfers Overall transfer level: Needs assistance Equipment used: Rolling walker (2 wheels) Transfers: Sit to/from Stand, Bed to chair/wheelchair/BSC Sit to Stand: Contact guard assist           General transfer comment: CGA for safety, cues to push from relciner arms however pt continuing to pull up from RW    Ambulation/Gait Ambulation/Gait assistance: Contact guard assist Gait Distance (Feet): 130 Feet (+25' to/from bathroom, +30' to/from room door) Assistive device: Rolling walker (2 wheels) Gait Pattern/deviations: Step-through pattern, Decreased step length - right, Decreased step length - left, Decreased stride length Gait velocity: decreased     General Gait Details: no LOB, light cues for closer RW proximity with good return.   Stairs Stairs: Yes Stairs assistance: Min assist, Contact guard assist Stair Management: One rail Left, Step to pattern, Forwards, Sideways Number of Stairs: 1 (x 6) General stair comments: pt able to step up/down x3 with rail on L and HHA on R, able to side step up and down x3 with bil UE on single rail with CGA, light assist to steady with HHA on inital 3 steps, utilizing foot rail and single step in room throughout stair training   Wheelchair Mobility     Tilt Bed    Modified Rankin (Stroke Patients Only)       Balance Overall balance assessment: Mild deficits observed, not formally tested                                          Communication Communication Communication:  Impaired Factors Affecting Communication: Hearing impaired;Other (comment) (son requests slow pace with louder clarity of speech to allow pt time to process the information)  Cognition Arousal: Alert Behavior During Therapy: [facility] for tasks assessed/performed   PT - Cognitive impairments: History of cognitive impairments                         Following commands:  Impaired Following commands impaired: Follows one step commands inconsistently, Follows one step commands with increased time, Follows multi-step commands inconsistently, Follows multi-step commands with increased time    Cueing Cueing Techniques: Verbal cues, Visual cues, Tactile cues  Exercises      General Comments General comments (skin integrity, edema, etc.): VSS on RA, son present and supportive      Pertinent Vitals/Pain Pain Assessment Pain Assessment: Faces Faces Pain Scale: Hurts a little bit Pain Location: L hip and low back Pain Descriptors / Indicators: Discomfort, Aching Pain Intervention(s): Monitored during session, Limited activity within patient's tolerance    Home Living                          Prior Function            PT Goals (current goals can now be found in the care plan section) Acute Rehab PT Goals Patient Stated Goal: Return Home and keep moving PT Goal Formulation: With patient/family Time For Goal Achievement: 04/26/24 Progress towards PT goals: Progressing toward goals    Frequency    Min 2X/week      PT Plan      Co-evaluation              AM-PAC PT 6 Clicks Mobility   Outcome Measure  Help needed turning from your back to your side while in a flat bed without using bedrails?: A Little Help needed moving from lying on your back to sitting on the side of a flat bed without using bedrails?: A Little Help needed moving to and from a bed to a chair (including a wheelchair)?: A Little Help needed standing up from a chair using your arms (e.g., wheelchair or bedside chair)?: A Little Help needed to walk in hospital room?: A Little Help needed climbing 3-5 steps with a railing? : A Lot 6 Click Score: 17    End of Session Equipment Utilized During Treatment: Gait belt Activity Tolerance: Patient tolerated treatment well Patient left: in chair;with call bell/phone within reach;with family/visitor present Nurse  Communication: Mobility status;Patient requests pain meds PT Visit Diagnosis: Difficulty in walking, not elsewhere classified (R26.2);Other abnormalities of gait and mobility (R26.89);Unsteadiness on feet (R26.81)     Time: 8668-8645 PT Time Calculation (min) (ACUTE ONLY): 23 min  Charges:    $Gait Training: 23-37 mins PT General Charges $$ ACUTE PT VISIT: 1 Visit                     Gurveer Colucci R. PTA Acute Rehabilitation Services Office: [phone]   Therisa CHRISTELLA Boor 04/13/2024, 2:43 PM

## 2024-04-14 DIAGNOSIS — S3210XA Unspecified fracture of sacrum, initial encounter for closed fracture: Secondary | ICD-10-CM | POA: Diagnosis not present

## 2024-04-14 LAB — URINE CULTURE: Culture: NO GROWTH

## 2024-04-14 MED ORDER — CEPHALEXIN 500 MG PO CAPS: 500.0000 mg | ORAL_CAPSULE | Freq: Two times a day (BID) | ORAL | 0 refills | Status: AC

## 2024-04-14 MED ORDER — CEPHALEXIN 500 MG PO CAPS
500.0000 mg | ORAL_CAPSULE | Freq: Two times a day (BID) | ORAL | Status: DC
  Administered 2024-04-14: 500 mg via ORAL
  Filled 2024-04-14: qty 1

## 2024-04-14 MED ORDER — ASPIRIN 81 MG PO CHEW: 81.0000 mg | CHEWABLE_TABLET | Freq: Two times a day (BID) | ORAL | 0 refills | Status: AC

## 2024-04-14 MED ORDER — POLYETHYLENE GLYCOL 3350 17 G PO PACK: 17.0000 g | PACK | Freq: Every day | ORAL | 0 refills | Status: AC | PRN

## 2024-04-14 MED ORDER — SENNA 8.6 MG PO TABS: 1.0000 | ORAL_TABLET | Freq: Two times a day (BID) | ORAL | 0 refills | Status: AC

## 2024-04-14 MED ORDER — CYANOCOBALAMIN 1000 MCG PO TABS: 1000.0000 ug | ORAL_TABLET | Freq: Every day | ORAL | 0 refills | Status: AC

## 2024-04-14 MED ORDER — LORAZEPAM 0.5 MG PO TABS: 0.5000 mg | ORAL_TABLET | Freq: Every day | ORAL | Status: AC

## 2024-04-14 MED ORDER — HYDROCODONE-ACETAMINOPHEN 5-325 MG PO TABS: 1.0000 | ORAL_TABLET | Freq: Four times a day (QID) | ORAL | 0 refills | Status: AC | PRN

## 2024-04-14 MED ORDER — ACETAMINOPHEN 500 MG PO TABS
1000.0000 mg | ORAL_TABLET | Freq: Three times a day (TID) | ORAL | Status: AC
Start: 2024-04-14 — End: ?

## 2024-04-14 NOTE — Discharge Summary (Addendum)
 Physician Discharge Summary  Danielle Proctor FMW:[id number] DOB: 1929-02-13 DOA: 04/10/2024  PCP: Clarice Nottingham, MD  Admit date: 04/10/2024 Discharge date: 04/14/2024  Admitted From: Home Disposition: Home  Recommendations for Outpatient Follow-up:  Follow up with PCP in 1 week  Outpatient follow-up with orthopedics.  Discharge wound care/activity/pain medications as per orthopedics recommendations Follow up in ED if symptoms worsen or new appear   Home Health: Home health PT/OT Equipment/Devices: None  Discharge Condition: Stable CODE STATUS: DNR Diet recommendation: Heart healthy  Brief/Interim Summary: 89 year old female with history of osteoporosis, mild cognitive impairment, IBS, hypertension, hyperlipidemia presented after a fall from home along with lower back pain.  On presentation, Chest x-ray showed new calcified right lower lobe nodules but no active disease.  CT head/CT cervical spine did not show any acute findings.  CT pelvis showed acute bilateral displaced/comminuted sacral alla fractures and likely hematoma formation in the presacral and pelvic sidewall .  CT lumbar spine showed acute minimally displaced left L5 transverse process fractures.  Orthopedics consulted: she was transferred to [facility].  She underwent percutaneous fixation of bilateral sacral fractures by orthopedics on 04/12/2024.  She was also put on IV Rocephin for possible UTI.  Urine cultures negative so far.  PT recommended home health PT.  Orthopedics has cleared her for discharge.  She will be discharged home with outpatient follow-up with PCP and orthopedics.    Discharge Diagnoses:   L5 transverse process fractures Commuted sacral ala fractures Mechanical fall Pelvic hematoma -Imaging as above.  Fall precautions.   -underwent percutaneous fixation of bilateral sacral fractures by orthopedics on 04/12/2024.  Wound care, activity, pain management as per orthopedics recommendations   -Hemoglobin stable recently.   - PT recommending home health PT.  She will be discharged home with outpatient follow-up with PCP and orthopedics.  Son feels okay taking her home today.   Pulmonary nodules -Possible new calcified right lower lobe nodules.  Unclear if we need to investigate more on this 89 year old female.  She denies any respiratory symptoms.  Outpatient follow-up with PCP   Hypothyroidism -Continue Synthroid    Probable vitamin B12 deficiency--continue oral supplementation   Insomnia - Continue Ativan  at bedtime   Leukocytosis - Resolved   anemia of chronic disease - From chronic illnesses.  Hemoglobin stable.  Monitor intermittently as an outpatient  Mild cognitive impairment - Outpatient follow-up with PCP.  Hypertension - Blood pressure intermittently on the higher side.  Discussed extensively with son regarding the same: We agreed that patient will follow-up with PCP regarding initiation of antihypertensives if needed.   Discharge Instructions  Discharge Instructions     Diet - low sodium heart healthy   Complete by: As directed    Discharge wound care:   Complete by: As directed    Discharge wound care as per orthopedics recommendations   Increase activity slowly   Complete by: As directed       Allergies as of 04/14/2024       Reactions   Other Other (See Comments)   NO NUTS, SEEDED FOODS, PEANUTS, ETC.. Mepergan Plus Phenergan = Blacked out   Alendronate Other (See Comments)   GI upset   Dicyclomine  Other (See Comments)   Dizziness   Erythromycin Diarrhea   Ezetimibe Other (See Comments)   Hands ached   Hydroxyzine Other (See Comments)   Excessive drowsiness   Hyoscyamine  Other (See Comments)   Weak   Prednisone  Other (See Comments)   Dizziness   Promethazine Other (  See Comments)   Dizziness   Statins Other (See Comments)   These are too strong for me.   Sulfa Antibiotics Nausea And Vomiting        Medication List      TAKE these medications    acetaminophen  500 MG tablet Commonly known as: TYLENOL  Take 2 tablets (1,000 mg total) by mouth 3 (three) times daily. What changed:  medication strength how much to take when to take this reasons to take this   aspirin 81 MG chewable tablet Commonly known as: Aspirin Childrens Chew 1 tablet (81 mg total) by mouth 2 (two) times daily. For 6 weeks for DVT prophylaxis after surgery   cephALEXin 500 MG capsule Commonly known as: KEFLEX Take 1 capsule (500 mg total) by mouth 2 (two) times daily for 6 days.   cyanocobalamin  1000 MCG tablet Take 1 tablet (1,000 mcg total) by mouth daily. Start taking on: April 15, 2024   HYDROcodone-acetaminophen  5-325 MG tablet Commonly known as: NORCO/VICODIN Take 1 tablet by mouth every 6 (six) hours as needed for severe pain (pain score 7-10).   levothyroxine  137 MCG tablet Commonly known as: SYNTHROID  Take 137 mcg by mouth daily before breakfast.   LORazepam  0.5 MG tablet Commonly known as: Ativan  Take 1 tablet (0.5 mg total) by mouth at bedtime.   polyethylene glycol 17 g packet Commonly known as: MiraLax Take 17 g by mouth daily as needed.   senna 8.6 MG Tabs tablet Commonly known as: SENOKOT Take 1 tablet (8.6 mg total) by mouth 2 (two) times daily.               Discharge Care Instructions  (From admission, onward)           Start     Ordered   04/14/24 0000  Discharge wound care:       Comments: Discharge wound care as per orthopedics recommendations   04/14/24 1039              Follow-up Information     Clarice Nottingham, MD. Schedule an appointment as soon as possible for a visit in 1 week(s).   Specialty: Internal Medicine Contact information: [address] [place] [address] [place] [address] [phone]         Kendal Franky SQUIBB, MD. Schedule an appointment as soon as possible for a visit in 1 week(s).   Specialty: Orthopedic Surgery Contact information: [address] [place] [address] [phone]                Allergies  Allergen Reactions   Other Other (See Comments)    NO NUTS, SEEDED FOODS, PEANUTS, ETC..  Mepergan Plus Phenergan = Blacked out   Alendronate Other (See Comments)    GI upset   Dicyclomine  Other (See Comments)    Dizziness   Erythromycin Diarrhea   Ezetimibe Other (See Comments)    Hands ached   Hydroxyzine Other (See Comments)    Excessive drowsiness   Hyoscyamine  Other (See Comments)    Weak   Prednisone  Other (See Comments)    Dizziness    Promethazine Other (See Comments)    Dizziness   Statins Other (See Comments)    These are too strong for me.   Sulfa Antibiotics Nausea And Vomiting    Consultations: Orthopedics   Procedures/Studies: DG Pelvis Comp Min 3V Result Date: 04/12/2024 CLINICAL DATA:  Pelvic fracture. EXAM: JUDET PELVIS - 3+ VIEW COMPARISON:  Preoperative imaging FINDINGS: Screw traverses the sacroiliac  joints and sacrum. Known sacral fractures are faintly visualized. No evidence of new fracture. Enteric sutures noted in the right abdomen IMPRESSION: Screw traverses the sacroiliac joints and sacrum. Known sacral fractures are faintly visualized. Electronically Signed   By: Andrea Gasman M.D.   On: 04/12/2024 11:10   DG Pelvis Comp Min 3V Result Date: 04/12/2024 CLINICAL DATA:  Elective surgery. EXAM: JUDET PELVIS - 3+ VIEW COMPARISON:  Preoperative imaging FINDINGS: Ten intraoperative spot views of the pelvis submitted from the operating room. Imaging obtained during screw fixation of the sacrum and sacroiliac joints. Known sacral fractures are faintly visualized. Fluoroscopy time 48 seconds. Dose 4.77 mGy. IMPRESSION: Intraoperative spot views during sacral fracture fixation. Electronically Signed   By: Andrea Gasman M.D.   On: 04/12/2024 11:10   DG C-Arm 1-60 Min-No Report Result Date: 04/12/2024 Fluoroscopy was utilized by the requesting physician.  No  radiographic interpretation.   CT Angio Aortobifemoral W and/or Wo Contrast Result Date: 04/11/2024 CLINICAL DATA:  Acute bilateral displaced comminuted sacral ala fractures extending to the SI joints and S1 and S2 neural foramina. There is associated pelvic hemorrhage. We are evaluating for arterial injury. EXAM: CT ANGIOGRAPHY OF ABDOMINAL AORTA WITH ILIOFEMORAL RUNOFF TECHNIQUE: Multidetector CT imaging of the abdomen, pelvis and lower extremities was performed using the standard protocol during bolus administration of intravenous contrast. Multiplanar CT image reconstructions and MIPs were obtained to evaluate the vascular anatomy. RADIATION DOSE REDUCTION: This exam was performed according to the departmental dose-optimization program which includes automated exposure control, adjustment of the mA and/or kV according to patient size and/or use of iterative reconstruction technique. CONTRAST:  OMNIPAQUE  IOHEXOL  350 MG/ML SOLN COMPARISON:  CT pelvis without contrast yesterday, CT abdomen and pelvis with IV contrast 01/13/2022. FINDINGS: VASCULAR Aorta: Moderate to heavy patchy calcific plaque. No stenosis, dissection or aneurysm. Celiac: There is a 75% vessel origin stenosis, partly due to ostial calcific plaques and partly due to compression by the median arcuate ligament of the diaphragm. There is no poststenotic dilatation. The vessel and its branches otherwise opacified well. The branching appears standard. No visible branch occlusion. SMA: Patent without evidence of aneurysm, dissection, vasculitis or significant stenosis. There are nonstenosing ostial calcific plaques along the superior wall. Renals: Both are single, both demonstrate moderate ostial calcific plaques. The calcifications are non stenosing on the left and on the right, the calcifications cause a 50% vessel origin stenosis. No other stenoses are seen.  No branch occlusions. IMA: There is an 80% calcific vessel origin stenosis. The  vessel otherwise opacifies well. RIGHT Lower Extremity Inflow: No inflow arterial contrast extravasation is seen. There are scattered calcific plaques in the common iliac and internal iliac arteries. The external iliac artery is tortuous but plaque free. The inflow vessels do not show flow-limiting narrowing. Outflow: Common, superficial and profunda femoral arteries and the popliteal artery are patent without evidence of aneurysm, dissection, vasculitis or significant stenosis. There are mild scattered nonstenosing calcific plaques in the superficial femoral and popliteal artery. Runoff: Patent three vessel runoff to the ankle. There are mild scattered nonstenosing calcific plaques in the tibioperoneal trunk and anterior tibial artery. LEFT Lower Extremity Inflow: Common, internal and external iliac arteries are patent without evidence of aneurysm, dissection, vasculitis or significant stenosis. No inflow arterial contrast extravasation is seen. Small amount of scattered calcific plaque in the distal common iliac and internal iliac arteries. Outflow: Common, superficial and profunda femoral arteries and the popliteal artery are patent without evidence of aneurysm, dissection, vasculitis  or significant stenosis. There are mild scattered nonstenosing calcific plaques in the superficial femoral artery. Runoff: The proximal anterior tibial artery is moderately irregularly stenotic, with scattered plaque and thrombosis. It occludes in the lower calf region and then reconstitutes again just above the transition to the dorsalis pedis. The peroneal and posterior tibial arteries both runoff into the foot. Veins: No obvious venous abnormality within the limitations of this arterial phase study. Review of the MIP images confirms the above findings. NON-VASCULAR Lower chest: The cardiac size is upper limit of normal. There is calcification in the right coronary artery. There is no substantial pericardial effusion. The lung  bases clear. Hepatobiliary: No focal liver abnormality is seen. Stable post cholecystectomy common bile duct dilatation up to 12 mm is again noted. No visible filling defect. Pancreas: There is mild ductal dilatation without mass enhancement or inflammatory change. Spleen: No abnormality. Adrenals/Urinary Tract: There is no adrenal or renal mass enhancement. There is no urinary stone or obstruction. The bladder is unremarkable. Stomach/Bowel:  Negative stomach.  Negative unopacified small bowel. Evidence of previous ileocecectomy. Mild fecal stasis. No bowel obstruction or inflammation. Lymphatic: No adenopathy is seen. Reproductive: Vascular calcifications in the uterine wall. Uterus and ovaries are not enlarged. Other: Moderate retroperitoneal hemorrhage tracking along the distal paracolic gutters collecting in posterior deep pelvis, increased from yesterday's CT. Presumably venous blood since no arterial extravasation is identified. Numerous pelvic phleboliths. Musculoskeletal: Comminuted sacral ala fractures are again noted as described previously. No other regional skeletal fracture is seen. Osteopenia and degenerative change lumbar spine. IMPRESSION: VASCULAR 1. No inflow arterial contrast extravasation bilaterally. 2. No aortic branch arterial occlusions are seen. 3. Atherosclerosis. 4. 75% celiac artery origin stenosis, partly due to ostial calcific plaques and partly due to compression by the median arcuate ligament of the diaphragm. 5. 80% IMA calcific vessel origin stenosis. 6. 50% right renal artery origin stenosis. 7. LEFT lower extremity runoff: The proximal anterior tibial artery is moderately irregularly stenotic, with scattered plaque and thrombosis. It occludes in the lower calf region and then reconstitutes again just above the transition to the dorsalis pedis. The peroneal and posterior tibial arteries both runoff into the foot. 8. RIGHT lower extremity runoff: Patent three-vessel runoff with  mild atherosclerotic plaques. NON-VASCULAR 1. Moderate retroperitoneal hemorrhage in the distal paracolic gutters and posterior deep pelvis increased from yesterday's CT. Presumably venous blood since no arterial contrast extravasation is seen. 2. Comminuted sacral ala fractures as described previously. 3. Coronary artery disease. 4. Constipation. 5. Stable post cholecystectomy common bile duct dilatation up to 12 mm. 6. Mild pancreatic ductal dilatation without mass enhancement or inflammatory change. 7. Osteopenia and degenerative change. Aortic Atherosclerosis (ICD10-I70.0). Electronically Signed   By: Francis Quam M.D.   On: 04/11/2024 03:23   CT PELVIS WO CONTRAST Result Date: 04/10/2024 CLINICAL DATA:  These results were communicated via [email or web address] application on 04/10/2024 at 7:52 pm. EXAM: CT PELVIS WITHOUT CONTRAST TECHNIQUE: Multidetector CT imaging of the pelvis was performed following the standard protocol without intravenous contrast. RADIATION DOSE REDUCTION: This exam was performed according to the departmental dose-optimization program which includes automated exposure control, adjustment of the mA and/or kV according to patient size and/or use of iterative reconstruction technique. COMPARISON:  CT lumbar spine 04/10/2024. FINDINGS: Urinary Tract:  No abnormality visualized. Bowel: Bowel resection. Colonic diverticulosis. Unremarkable visualized pelvic bowel loops. Vascular/Lymphatic: Atherosclerotic plaque. No pathologically enlarged lymph nodes. No significant vascular abnormality seen. Reproductive:  No mass or other significant abnormality Other: Marked bilateral,  left greater than right, presacral and pelvic sidewall fat stranding and hematoma formation. Associated left external iliac vasculature injury not excluded with markedly limited evaluation on this noncontrast study (4:44). No intraperitoneal free fluid. No intraperitoneal free gas. No organized fluid collection. Musculoskeletal: Acute  bilateral displaced and comminuted sacral ala fractures extending to the sacroiliac joints as well as S1 and S2 osseous neural foramina. No acute displaced fracture or dislocation of either hips. Slightly asymmetric left iliopsoas muscles with underlying hematoma not excluded. Please see separately dictated CT lumbar spine 04/10/2024. IMPRESSION: 1. Acute bilateral displaced and comminuted sacral ala fractures extending to the sacroiliac joints as well as S1 and S2 osseous neural foramina. 2. Marked bilateral, left greater than right, presacral and pelvic sidewall fat stranding and hematoma formation. Concern for possible underlying left external iliac vasculature injury-markedly limited evaluation on this noncontrast study. 3.  Aortic Atherosclerosis (ICD10-I70.0). 4. Slightly asymmetric left iliopsoas muscles with underlying hematoma not excluded. 5. Please see separately dictated CT lumbar spine 04/10/2024. Electronically Signed   By: Morgane  Naveau M.D.   On: 04/10/2024 20:01   CT Lumbar Spine Wo Contrast Result Date: 04/10/2024 CLINICAL DATA:  Back trauma, no prior imaging (Age >= 16y) Pt reports a fall earlier today in her kitchen, C/O left hip pain. EXAM: CT LUMBAR SPINE WITHOUT CONTRAST TECHNIQUE: Multidetector CT imaging of the lumbar spine was performed without intravenous contrast administration. Multiplanar CT image reconstructions were also generated. RADIATION DOSE REDUCTION: This exam was performed according to the departmental dose-optimization program which includes automated exposure control, adjustment of the mA and/or kV according to patient size and/or use of iterative reconstruction technique. COMPARISON:  CT abdomen pelvis 01/13/2022, CT pelvis 04/10/2024 FINDINGS: Segmentation: 5 lumbar type vertebrae. Alignment: Grade 1 anterolisthesis of L4 on L5 and L5 on S1. Vertebrae: Diffusely decreased bone density. Multilevel mild degenerative changes of the spine. Multilevel disc bulge. Acute  minimally displaced left L5 transverse process fractures. Similar-appearing chronic mild L5 vertebral body height loss. No aggressive appearing focal pathologic process. Paraspinal and other soft tissues: Negative. Disc levels: Maintained. Other: Sacral fracture noted, please see separately dictated CT pelvis 04/10/2024. Atherosclerotic plaque of the aorta. Fat stranding along the pelvic sidewalls partially visualized. IMPRESSION: 1. Acute minimally displaced left L5 transverse process fractures. 2. Sacral fracture noted, please see separately dictated CT pelvis 04/10/2024. Electronically Signed   By: Morgane  Naveau M.D.   On: 04/10/2024 19:52   CT HEAD WO CONTRAST Result Date: 04/10/2024 CLINICAL DATA:  Head trauma, moderate-severe; Polytrauma, blunt EXAM: CT HEAD WITHOUT CONTRAST CT CERVICAL SPINE WITHOUT CONTRAST TECHNIQUE: Multidetector CT imaging of the head and cervical spine was performed following the standard protocol without intravenous contrast. Multiplanar CT image reconstructions of the cervical spine were also generated. RADIATION DOSE REDUCTION: This exam was performed according to the departmental dose-optimization program which includes automated exposure control, adjustment of the mA and/or kV according to patient size and/or use of iterative reconstruction technique. COMPARISON:  None Available. FINDINGS: CT HEAD FINDINGS Brain: No evidence of large-territorial acute infarction. No parenchymal hemorrhage. No mass lesion. No extra-axial collection. No mass effect or midline shift. No hydrocephalus. Basilar cisterns are patent. Vascular: No hyperdense vessel. Atherosclerotic calcifications are present within the cavernous internal carotid and vertebral arteries. Skull: No acute fracture or focal lesion. Sinuses/Orbits: Paranasal sinuses and mastoid air cells are clear. The orbits are unremarkable. Other: None. CT CERVICAL SPINE FINDINGS Alignment: Grade 1 anterolisthesis of C2 on C3, C3 on C4,  C4 on C5, C5  on C6. Skull base and vertebrae: Multilevel degenerative change of the spine with severe osseous neural foraminal stenosis at the C3-C4 level on the left. No acute fracture. No aggressive appearing focal osseous lesion or focal pathologic process. Soft tissues and spinal canal: No prevertebral fluid or swelling. No visible canal hematoma. Upper chest: Unremarkable. Other: None. IMPRESSION: 1. No acute intracranial abnormality. 2. No acute displaced fracture or traumatic listhesis of the cervical spine. Electronically Signed   By: Morgane  Naveau M.D.   On: 04/10/2024 19:46   CT CERVICAL SPINE WO CONTRAST Result Date: 04/10/2024 CLINICAL DATA:  Head trauma, moderate-severe; Polytrauma, blunt EXAM: CT HEAD WITHOUT CONTRAST CT CERVICAL SPINE WITHOUT CONTRAST TECHNIQUE: Multidetector CT imaging of the head and cervical spine was performed following the standard protocol without intravenous contrast. Multiplanar CT image reconstructions of the cervical spine were also generated. RADIATION DOSE REDUCTION: This exam was performed according to the departmental dose-optimization program which includes automated exposure control, adjustment of the mA and/or kV according to patient size and/or use of iterative reconstruction technique. COMPARISON:  None Available. FINDINGS: CT HEAD FINDINGS Brain: No evidence of large-territorial acute infarction. No parenchymal hemorrhage. No mass lesion. No extra-axial collection. No mass effect or midline shift. No hydrocephalus. Basilar cisterns are patent. Vascular: No hyperdense vessel. Atherosclerotic calcifications are present within the cavernous internal carotid and vertebral arteries. Skull: No acute fracture or focal lesion. Sinuses/Orbits: Paranasal sinuses and mastoid air cells are clear. The orbits are unremarkable. Other: None. CT CERVICAL SPINE FINDINGS Alignment: Grade 1 anterolisthesis of C2 on C3, C3 on C4, C4 on C5, C5 on C6. Skull base and vertebrae:  Multilevel degenerative change of the spine with severe osseous neural foraminal stenosis at the C3-C4 level on the left. No acute fracture. No aggressive appearing focal osseous lesion or focal pathologic process. Soft tissues and spinal canal: No prevertebral fluid or swelling. No visible canal hematoma. Upper chest: Unremarkable. Other: None. IMPRESSION: 1. No acute intracranial abnormality. 2. No acute displaced fracture or traumatic listhesis of the cervical spine. Electronically Signed   By: Morgane  Naveau M.D.   On: 04/10/2024 19:46   DG Pelvis Portable Result Date: 04/10/2024 CLINICAL DATA:  Trauma, fall. EXAM: PORTABLE PELVIS 1-2 VIEWS COMPARISON:  None Available. FINDINGS: There is no evidence of pelvic fracture or diastasis. No pelvic bone lesions are seen. Surgical staple line is seen in the right abdomen. IMPRESSION: Negative. Electronically Signed   By: Greig Pique M.D.   On: 04/10/2024 18:01   DG Chest Port 1 View Result Date: 04/10/2024 CLINICAL DATA:  Trauma EXAM: PORTABLE CHEST 1 VIEW COMPARISON:  Chest x-ray 06/01/2022 FINDINGS: The heart size and mediastinal contours are within normal limits. There are calcified nodules in the right lower lung, new from prior. The lungs are otherwise clear. There is no pleural effusion or pneumothorax. The visualized skeletal structures are unremarkable. IMPRESSION: 1. No active disease. 2. New calcified right lower lobe nodules. Electronically Signed   By: Greig Pique M.D.   On: 04/10/2024 17:55      Subjective: Patient seen and examined at bedside.  Poor historian.  Slightly confused.  No fever, vomiting, abdominal pain reported.  Complains of intermittent hip and back pain.  Discharge Exam: Vitals:   04/14/24 0424 04/14/24 0741  BP: (!) 170/83 (!) 193/92  Pulse: 83 91  Resp: 16 15  Temp: 98.7 F (37.1 C) 97.9 F (36.6 C)  SpO2: 97% 95%    General: Pt is alert, awake, not in acute  distress.  Elderly female lying in bed.  On room  air.  Slow to respond.  Poor historian.   Cardiovascular: rate controlled, S1/S2 + Respiratory: bilateral decreased breath sounds at bases Abdominal: Soft, NT, ND, bowel sounds + Extremities: Mild lower extremity edema; no cyanosis    The results of significant diagnostics from this hospitalization (including imaging, microbiology, ancillary and laboratory) are listed below for reference.     Microbiology: Recent Results (from the past 240 hours)  Surgical pcr screen     Status: None   Collection Time: 04/11/24  6:10 AM   Specimen: Nasal Mucosa; Nasal Swab  Result Value Ref Range Status   MRSA, PCR NEGATIVE NEGATIVE Final   Staphylococcus aureus NEGATIVE NEGATIVE Final    Comment: (NOTE) The Xpert SA Assay (FDA approved for NASAL specimens in patients 62 years of age and older), is one component of a comprehensive surveillance program. It is not intended to diagnose infection nor to guide or monitor treatment. Performed at [facility], [address] [place], [place] [address]   Surgical PCR screen     Status: None   Collection Time: 04/12/24 12:00 AM   Specimen: Nasal Mucosa; Nasal Swab  Result Value Ref Range Status   MRSA, PCR NEGATIVE NEGATIVE Final   Staphylococcus aureus NEGATIVE NEGATIVE Final    Comment: (NOTE) The Xpert SA Assay (FDA approved for NASAL specimens in patients 18 years of age and older), is one component of a comprehensive surveillance program. It is not intended to diagnose infection nor to guide or monitor treatment. Performed at [facility], [address], [place], [place] [address]      Labs: BNP (last 3 results) No results for input(s): BNP in the last 8760 hours. Basic Metabolic Panel: Recent Labs  Lab 04/10/24 1715 04/11/24 0329 04/12/24 1004  NA 140 135 136  K 3.6 4.6 4.0  CL 104 103 101  CO2 24 24 28   GLUCOSE 175* 147* 131*  BUN 25* 23 18  CREATININE 0.90 0.86 0.97  CALCIUM 9.8 9.1 9.2  MG 2.3   --   --    Liver Function Tests: Recent Labs  Lab 04/10/24 1715  AST 29  ALT 25  ALKPHOS 24*  BILITOT 0.4  PROT 6.7  ALBUMIN 4.0   No results for input(s): LIPASE, AMYLASE in the last 168 hours. No results for input(s): AMMONIA in the last 168 hours. CBC: Recent Labs  Lab 04/10/24 1715 04/11/24 0329 04/12/24 1004  WBC 7.9 13.7* 8.9  HGB 12.6 11.8* 10.7*  HCT 37.3 35.0* 31.7*  MCV 92.1 92.1 91.9  PLT 249 172 127*   Cardiac Enzymes: No results for input(s): CKTOTAL, CKMB, CKMBINDEX, TROPONINI in the last 168 hours. BNP: Invalid input(s): POCBNP CBG: No results for input(s): GLUCAP in the last 168 hours. D-Dimer No results for input(s): DDIMER in the last 72 hours. Hgb A1c No results for input(s): HGBA1C in the last 72 hours. Lipid Profile No results for input(s): CHOL, HDL, LDLCALC, TRIG, CHOLHDL, LDLDIRECT in the last 72 hours. Thyroid  function studies No results for input(s): TSH, T4TOTAL, T3FREE, THYROIDAB in the last 72 hours.  Invalid input(s): FREET3 Anemia work up No results for input(s): VITAMINB12, FOLATE, FERRITIN, TIBC, IRON, RETICCTPCT in the last 72 hours. Urinalysis    Component Value Date/Time   COLORURINE YELLOW 04/11/2024 2200   APPEARANCEUR CLEAR 04/11/2024 2200   LABSPEC 1.034 (H) 04/11/2024 2200   PHURINE 6.0 04/11/2024 2200   GLUCOSEU 50 (  A) 04/11/2024 2200   HGBUR NEGATIVE 04/11/2024 2200   BILIRUBINUR NEGATIVE 04/11/2024 2200   KETONESUR NEGATIVE 04/11/2024 2200   PROTEINUR NEGATIVE 04/11/2024 2200   UROBILINOGEN 0.2 06/22/2014 1508   NITRITE NEGATIVE 04/11/2024 2200   LEUKOCYTESUR TRACE (A) 04/11/2024 2200   Sepsis Labs Recent Labs  Lab 04/10/24 1715 04/11/24 0329 04/12/24 1004  WBC 7.9 13.7* 8.9   Microbiology Recent Results (from the past 240 hours)  Surgical pcr screen     Status: None   Collection Time: 04/11/24  6:10 AM   Specimen: Nasal Mucosa; Nasal Swab   Result Value Ref Range Status   MRSA, PCR NEGATIVE NEGATIVE Final   Staphylococcus aureus NEGATIVE NEGATIVE Final    Comment: (NOTE) The Xpert SA Assay (FDA approved for NASAL specimens in patients 15 years of age and older), is one component of a comprehensive surveillance program. It is not intended to diagnose infection nor to guide or monitor treatment. Performed at [facility], [address], [place], [place] [address]   Surgical PCR screen     Status: None   Collection Time: 04/12/24 12:00 AM   Specimen: Nasal Mucosa; Nasal Swab  Result Value Ref Range Status   MRSA, PCR NEGATIVE NEGATIVE Final   Staphylococcus aureus NEGATIVE NEGATIVE Final    Comment: (NOTE) The Xpert SA Assay (FDA approved for NASAL specimens in patients 42 years of age and older), is one component of a comprehensive surveillance program. It is not intended to diagnose infection nor to guide or monitor treatment. Performed at [facility], [address], [place], [place] [address]      Time coordinating discharge: 35 minutes  SIGNED:   Sophie Mao, MD  [facility] 04/14/2024, 10:40 AM

## 2024-04-14 NOTE — Plan of Care (Signed)

## 2024-04-14 NOTE — Discharge Instructions (Addendum)
 Orthopaedic Trauma Service Discharge Instructions   General Discharge Instructions  WEIGHT BEARING STATUS: weight bearing as tolerated  RANGE OF MOTION/ACTIVITY: no restrictions   Wound Care: You may remove your surgical dressing on post-op day #3, (date June 30). Incisions can be left open to air if there is no drainage. Once the incision is completely dry and without drainage, it may be left open to air out.  Showering may begin after dressing has been removed, (date June 30).  Clean incision gently with soap and water.  DVT/PE prophylaxis: Aspirin 81 mg BID  Diet: as you were eating previously.  Can use over the counter stool softeners and bowel preparations, such as Miralax, to help with bowel movements.  Narcotics can be constipating.  Be sure to drink plenty of fluids  PAIN MEDICATION USE AND EXPECTATIONS  You have likely been given narcotic medications to help control your pain.  After a traumatic event that results in an fracture (broken bone) with or without surgery, it is ok to use narcotic pain medications to help control one's pain.  We understand that everyone responds to pain differently and each individual patient will be evaluated on a regular basis for the continued need for narcotic medications. Ideally, narcotic medication use should last no more than 6-8 weeks (coinciding with fracture healing).   As a patient it is your responsibility as well to monitor narcotic medication use and report the amount and frequency you use these medications when you come to your office visit.   We would also advise that if you are using narcotic medications, you should take a dose prior to therapy to maximize you participation.  IF YOU ARE ON NARCOTIC MEDICATIONS IT IS NOT PERMISSIBLE TO OPERATE A MOTOR VEHICLE (MOTORCYCLE/CAR/TRUCK/MOPED) OR HEAVY MACHINERY DO NOT MIX NARCOTICS WITH OTHER CNS (CENTRAL NERVOUS SYSTEM) DEPRESSANTS SUCH AS ALCOHOL  POST-OPERATIVE OPIOID TAPER  INSTRUCTIONS: It is important to wean off of your opioid medication as soon as possible. If you do not need pain medication after your surgery it is ok to stop day one. Opioids include: Codeine, Hydrocodone(Norco, Vicodin), Oxycodone (Percocet, oxycontin ) and hydromorphone  amongst others.  Long term and even short term use of opiods can cause: Increased pain response Dependence Constipation Depression Respiratory depression And more.  Withdrawal symptoms can include Flu like symptoms Nausea, vomiting And more Techniques to manage these symptoms Hydrate well Eat regular healthy meals Stay active Use relaxation techniques(deep breathing, meditating, yoga) Do Not substitute Alcohol to help with tapering If you have been on opioids for less than two weeks and do not have pain than it is ok to stop all together.  Plan to wean off of opioids This plan should start within one week post op of your fracture surgery  Maintain the same interval or time between taking each dose and first decrease the dose.  Cut the total daily intake of opioids by one tablet each day Next start to increase the time between doses. The last dose that should be eliminated is the evening dose.    STOP SMOKING OR USING NICOTINE PRODUCTS!!!!  As discussed nicotine severely impairs your body's ability to heal surgical and traumatic wounds but also impairs bone healing.  Wounds and bone heal by forming microscopic blood vessels (angiogenesis) and nicotine is a vasoconstrictor (essentially, shrinks blood vessels).  Therefore, if vasoconstriction occurs to these microscopic blood vessels they essentially disappear and are unable to deliver necessary nutrients to the healing tissue.  This is one modifiable factor that  you can do to dramatically increase your chances of healing your injury.  (This means no smoking, no nicotine gum, patches, etc)  DO NOT USE NONSTEROIDAL ANTI-INFLAMMATORY DRUGS (NSAID'S)  Using products such  as Advil (ibuprofen), Aleve (naproxen), Motrin (ibuprofen) for additional pain control during fracture healing can delay and/or prevent the healing response.  If you would like to take over the counter (OTC) medication, Tylenol  (acetaminophen ) is ok.  However, some narcotic medications that are given for pain control contain acetaminophen  as well. Therefore, you should not exceed more than 4000 mg of tylenol  in a day if you do not have liver disease.  Also note that there are may OTC medicines, such as cold medicines and allergy medicines that my contain tylenol  as well.  If you have any questions about medications and/or interactions please ask your doctor/PA or your pharmacist.      ICE AND ELEVATE INJURED/OPERATIVE EXTREMITY  Using ice and elevating the injured extremity above your heart can help with swelling and pain control.  Icing in a pulsatile fashion, such as 20 minutes on and 20 minutes off, can be followed.    Do not place ice directly on skin. Make sure there is a barrier between to skin and the ice pack.    Using frozen items such as frozen peas works well as the conform nicely to the are that needs to be iced.  USE AN ACE WRAP OR TED HOSE FOR SWELLING CONTROL  In addition to icing and elevation, Ace wraps or TED hose are used to help limit and resolve swelling.  It is recommended to use Ace wraps or TED hose until you are informed to stop.    When using Ace Wraps start the wrapping distally (farthest away from the body) and wrap proximally (closer to the body)   Example: If you had surgery on your leg or thing and you do not have a splint on, start the ace wrap at the toes and work your way up to the thigh        If you had surgery on your upper extremity and do not have a splint on, start the ace wrap at your fingers and work your way up to the upper arm  IF YOU ARE IN A SPLINT OR CAST DO NOT REMOVE IT FOR ANY REASON   If your splint gets wet for any reason please contact the office  immediately. You may shower in your splint or cast as long as you keep it dry.  This can be done by wrapping in a cast cover or garbage back (or similar)  Do Not stick any thing down your splint or cast such as pencils, money, or hangers to try and scratch yourself with.  If you feel itchy take benadryl as prescribed on the bottle for itching  IF YOU ARE IN A CAM BOOT (BLACK BOOT)  You may remove boot periodically. Perform daily dressing changes as noted below.  Wash the liner of the boot regularly and wear a sock when wearing the boot. It is recommended that you sleep in the boot until told otherwise   CALL THE OFFICE FOR MEDICATION REFILLS OR WITH ANY QUESTIONS/CONCERNS: [phone]   VISIT OUR WEBSITE FOR ADDITIONAL INFORMATION: [email or web address]    Discharge Pin Site Instructions  Dress pins daily with Kerlix roll starting on POD 2. Wrap the Kerlix so that it tamps the skin down around the pin-skin interface to prevent/limit motion of the skin relative to the pin.  (  Pin-skin motion is the primary cause of pain and infection related to external fixator pin sites).  Remove any crust or coagulum that may obstruct drainage with a saline moistened gauze or soap and water.  After POD 3, if there is no discernable drainage on the pin site dressing, the interval for change can by increased to every other day.  You may shower with the fixator, cleaning all pin sites gently with soap and water.  If you have a surgical wound this needs to be completely dry and without drainage before showering.  The extremity can be lifted by the fixator to facilitate wound care and transfers.  Notify the office/Doctor if you experience increasing drainage, redness, or pain from a pin site, or if you notice purulent (thick, snot-like) drainage.   Discharge Wound Care Instructions  Do NOT apply any ointments, solutions or lotions to pin sites or surgical wounds.  These prevent needed drainage and even though  solutions like hydrogen peroxide kill bacteria, they also damage cells lining the pin sites that help fight infection.  Applying lotions or ointments can keep the wounds moist and can cause them to breakdown and open up as well. This can increase the risk for infection. When in doubt call the office.  Surgical incisions should be dressed daily.  If any drainage is noted, use one layer of adaptic or Mepitel, then gauze, Kerlix, and an ace wrap. - These dressing supplies should be available at local medical supply stores ([facility], [facility], etc) as well as Insurance claims handler ([facility], [facility], [facility], etc)  Once the incision is completely dry and without drainage, it may be left open to air out.  Showering may begin 36-48 hours later.  Cleaning gently with soap and water.  Traumatic wounds should be dressed daily as well.    One layer of adaptic, gauze, Kerlix, then ace wrap.  The adaptic can be discontinued once the draining has ceased    If you have a wet to dry dressing: wet the gauze with saline the squeeze as much saline out so the gauze is moist (not soaking wet), place moistened gauze over wound, then place a dry gauze over the moist one, followed by Kerlix wrap, then ace wrap.    Call office for the following: Temperature greater than 101F Persistent nausea and vomiting Severe uncontrolled pain Redness, tenderness, or signs of infection (pain, swelling, redness, odor or green/yellow discharge around the site) Difficulty breathing, headache or visual disturbances Hives Persistent dizziness or light-headedness Extreme fatigue Any other questions or concerns you may have after discharge  In an emergency, call 911 or go to an Emergency Department at a nearby hospital  OTHER HELPFUL INFORMATION  If you had a block, it will wear off between 8-24 hrs postop typically.  This is period when your pain may go from nearly zero to the pain you would have had postop without the  block.  This is an abrupt transition but nothing dangerous is happening.  You may take an extra dose of narcotic when this happens.  You should wean off your narcotic medicines as soon as you are able.  Most patients will be off or using minimal narcotics before their first postop appointment.   We suggest you use the pain medication the first night prior to going to bed, in order to ease any pain when the anesthesia wears off. You should avoid taking pain medications on an empty stomach as it will make you nauseous.  Do not  drink alcoholic beverages or take illicit drugs when taking pain medications.  In most states it is against the law to drive while you are in a splint or sling.  And certainly against the law to drive while taking narcotics.  You may return to work/school in the next couple of days when you feel up to it.   Pain medication may make you constipated.  Below are a few solutions to try in this order: Decrease the amount of pain medication if you aren't having pain. Drink lots of decaffeinated fluids. Drink prune juice and/or each dried prunes  If the first 3 don't work start with additional solutions Take Colace - an over-the-counter stool softener Take Senokot - an over-the-counter laxative Take Miralax - a stronger over-the-counter laxative    Wound Care: You may remove your surgical dressing on post-op day #3, (date June 30). Incisions can be left open to air if there is no drainage. Once the incision is completely dry and without drainage, it may be left open to air out. Showering may begin after dressing has been removed, (date June 30). Clean incision gently with soap and water.

## 2024-04-14 NOTE — Progress Notes (Signed)
 Nsg Discharge Note  Admit Date:  04/10/2024 Discharge date: 04/14/2024   Danielle Proctor to be D/C'd Home per MD order.  AVS completed.  C Patient/caregiver able to verbalize understanding.  Discharge Medication: Allergies as of 04/14/2024       Reactions   Other Other (See Comments)   NO NUTS, SEEDED FOODS, PEANUTS, ETC.. Mepergan Plus Phenergan = Blacked out   Alendronate Other (See Comments)   GI upset   Dicyclomine  Other (See Comments)   Dizziness   Erythromycin Diarrhea   Ezetimibe Other (See Comments)   Hands ached   Hydroxyzine Other (See Comments)   Excessive drowsiness   Hyoscyamine  Other (See Comments)   Weak   Prednisone  Other (See Comments)   Dizziness   Promethazine Other (See Comments)   Dizziness   Statins Other (See Comments)   These are too strong for me.   Sulfa Antibiotics Nausea And Vomiting        Medication List     TAKE these medications    acetaminophen  500 MG tablet Commonly known as: TYLENOL  Take 2 tablets (1,000 mg total) by mouth 3 (three) times daily. What changed:  medication strength how much to take when to take this reasons to take this   aspirin 81 MG chewable tablet Commonly known as: Aspirin Childrens Chew 1 tablet (81 mg total) by mouth 2 (two) times daily. For 6 weeks for DVT prophylaxis after surgery   cephALEXin 500 MG capsule Commonly known as: KEFLEX Take 1 capsule (500 mg total) by mouth 2 (two) times daily for 6 days.   cyanocobalamin  1000 MCG tablet Take 1 tablet (1,000 mcg total) by mouth daily. Start taking on: April 15, 2024   HYDROcodone-acetaminophen  5-325 MG tablet Commonly known as: NORCO/VICODIN Take 1 tablet by mouth every 6 (six) hours as needed for severe pain (pain score 7-10).   levothyroxine  137 MCG tablet Commonly known as: SYNTHROID  Take 137 mcg by mouth daily before breakfast.   LORazepam  0.5 MG tablet Commonly known as: Ativan  Take 1 tablet (0.5 mg total) by mouth at  bedtime.   polyethylene glycol 17 g packet Commonly known as: MiraLax Take 17 g by mouth daily as needed.   senna 8.6 MG Tabs tablet Commonly known as: SENOKOT Take 1 tablet (8.6 mg total) by mouth 2 (two) times daily.               Discharge Care Instructions  (From admission, onward)           Start     Ordered   04/14/24 0000  Discharge wound care:       Comments: Discharge wound care as per orthopedics recommendations   04/14/24 1039            Discharge Assessment: Vitals:   04/14/24 0741 04/14/24 1431  BP: (!) 193/92 (!) 169/59  Pulse: 91 92  Resp: 15 16  Temp: 97.9 F (36.6 C) 98.3 F (36.8 C)  SpO2: 95% 94%   Skin clean, dry and intact without evidence of skin break down, no evidence of skin tears noted. IV catheter discontinued intact. Site without signs and symptoms of complications - no redness or edema noted at insertion site, patient denies c/o pain - only slight tenderness at site.  Dressing with slight pressure applied.  D/c Instructions-Education: Discharge instructions given to patient/family with verbalized understanding. D/c education completed with patient/family including follow up instructions, medication list, d/c activities limitations if indicated, with other d/c instructions as indicated by  MD - patient able to verbalize understanding, all questions fully answered. Patient instructed to return to ED, call 911, or call MD for any changes in condition.  Patient escorted via WC, and D/C home via private auto.  Jarrett Chicoine, Naomie Sailors, RN 04/14/2024 4:11 PM

## 2024-04-14 NOTE — Progress Notes (Signed)
 Son stated that he already has contact information for [facility]. No need to reprint AVS

## 2024-04-14 NOTE — TOC Transition Note (Addendum)
 Transition of Care [facility]) - Discharge Note   Patient Details  Name: Danielle Proctor MRN: [id number] Date of Birth: Jun 02, 1929  Transition of [facility]) CM/SW Contact:  Robynn Eileen Hoose, RN Phone Number: 04/14/2024, 11:18 AM   Clinical Narrative:   Patient is being discharged today. Son requests to research [facility] agencies from the list given to him yesterday. He will reach out to patient primary care provider regarding [facility] services.  1400: secure message from floor nurse regarding son wanting to use [facility]da [facility]alk[facility] services. Referral sent to Cindie with [facility] and she was able to accept. Contact information placed on AVS.      Final next level of care: Home/Self Care Barriers to Discharge: No Barriers Identified   Patient Goals and CMS Choice            Discharge Placement                       Discharge Plan and Services Additional resources added to the After Visit Summary for                                       Social Drivers of Health (SDOH) Interventions SDOH Screenings   Food Insecurity: No Food Insecurity (04/10/2024)  Housing: Low Risk  (04/10/2024)  Transportation Needs: No Transportation Needs (04/10/2024)  Utilities: Not At Risk (04/10/2024)  Depression (PHQ2-9): Low Risk  (05/23/2022)  Social Connections: Socially Isolated (04/10/2024)  Tobacco Use: Low Risk  (04/12/2024)     Readmission Risk Interventions     No data to display

## 2024-04-15 ENCOUNTER — Encounter (HOSPITAL_COMMUNITY): Payer: Self-pay | Admitting: Student

## 2024-04-15 DIAGNOSIS — I701 Atherosclerosis of renal artery: Secondary | ICD-10-CM | POA: Diagnosis not present

## 2024-04-15 DIAGNOSIS — I70203 Unspecified atherosclerosis of native arteries of extremities, bilateral legs: Secondary | ICD-10-CM | POA: Diagnosis not present

## 2024-04-15 DIAGNOSIS — I10 Essential (primary) hypertension: Secondary | ICD-10-CM | POA: Diagnosis not present

## 2024-04-15 DIAGNOSIS — M8008XD Age-related osteoporosis with current pathological fracture, vertebra(e), subsequent encounter for fracture with routine healing: Secondary | ICD-10-CM | POA: Diagnosis not present

## 2024-04-15 DIAGNOSIS — G3184 Mild cognitive impairment, so stated: Secondary | ICD-10-CM | POA: Diagnosis not present

## 2024-04-15 DIAGNOSIS — I774 Celiac artery compression syndrome: Secondary | ICD-10-CM | POA: Diagnosis not present

## 2024-04-15 DIAGNOSIS — G4700 Insomnia, unspecified: Secondary | ICD-10-CM | POA: Diagnosis not present

## 2024-04-15 DIAGNOSIS — I2510 Atherosclerotic heart disease of native coronary artery without angina pectoris: Secondary | ICD-10-CM | POA: Diagnosis not present

## 2024-04-15 DIAGNOSIS — M800AXD Age-related osteoporosis with current pathological fracture, other site, subsequent encounter for fracture with routine healing: Secondary | ICD-10-CM | POA: Diagnosis not present

## 2024-04-17 DIAGNOSIS — G3184 Mild cognitive impairment, so stated: Secondary | ICD-10-CM | POA: Diagnosis not present

## 2024-04-17 DIAGNOSIS — M8008XD Age-related osteoporosis with current pathological fracture, vertebra(e), subsequent encounter for fracture with routine healing: Secondary | ICD-10-CM | POA: Diagnosis not present

## 2024-04-17 DIAGNOSIS — I701 Atherosclerosis of renal artery: Secondary | ICD-10-CM | POA: Diagnosis not present

## 2024-04-17 DIAGNOSIS — I70203 Unspecified atherosclerosis of native arteries of extremities, bilateral legs: Secondary | ICD-10-CM | POA: Diagnosis not present

## 2024-04-17 DIAGNOSIS — I10 Essential (primary) hypertension: Secondary | ICD-10-CM | POA: Diagnosis not present

## 2024-04-17 DIAGNOSIS — I2510 Atherosclerotic heart disease of native coronary artery without angina pectoris: Secondary | ICD-10-CM | POA: Diagnosis not present

## 2024-04-17 DIAGNOSIS — I774 Celiac artery compression syndrome: Secondary | ICD-10-CM | POA: Diagnosis not present

## 2024-04-17 DIAGNOSIS — G4700 Insomnia, unspecified: Secondary | ICD-10-CM | POA: Diagnosis not present

## 2024-04-17 DIAGNOSIS — M800AXD Age-related osteoporosis with current pathological fracture, other site, subsequent encounter for fracture with routine healing: Secondary | ICD-10-CM | POA: Diagnosis not present

## 2024-04-18 DIAGNOSIS — M800AXD Age-related osteoporosis with current pathological fracture, other site, subsequent encounter for fracture with routine healing: Secondary | ICD-10-CM | POA: Diagnosis not present

## 2024-04-18 DIAGNOSIS — I2510 Atherosclerotic heart disease of native coronary artery without angina pectoris: Secondary | ICD-10-CM | POA: Diagnosis not present

## 2024-04-18 DIAGNOSIS — M8008XD Age-related osteoporosis with current pathological fracture, vertebra(e), subsequent encounter for fracture with routine healing: Secondary | ICD-10-CM | POA: Diagnosis not present

## 2024-04-18 DIAGNOSIS — Z09 Encounter for follow-up examination after completed treatment for conditions other than malignant neoplasm: Secondary | ICD-10-CM | POA: Diagnosis not present

## 2024-04-18 DIAGNOSIS — G4700 Insomnia, unspecified: Secondary | ICD-10-CM | POA: Diagnosis not present

## 2024-04-18 DIAGNOSIS — I774 Celiac artery compression syndrome: Secondary | ICD-10-CM | POA: Diagnosis not present

## 2024-04-18 DIAGNOSIS — I701 Atherosclerosis of renal artery: Secondary | ICD-10-CM | POA: Diagnosis not present

## 2024-04-18 DIAGNOSIS — G3184 Mild cognitive impairment, so stated: Secondary | ICD-10-CM | POA: Diagnosis not present

## 2024-04-18 DIAGNOSIS — I70203 Unspecified atherosclerosis of native arteries of extremities, bilateral legs: Secondary | ICD-10-CM | POA: Diagnosis not present

## 2024-04-18 DIAGNOSIS — M810 Age-related osteoporosis without current pathological fracture: Secondary | ICD-10-CM | POA: Diagnosis not present

## 2024-04-18 DIAGNOSIS — I10 Essential (primary) hypertension: Secondary | ICD-10-CM | POA: Diagnosis not present

## 2024-04-22 DIAGNOSIS — G4700 Insomnia, unspecified: Secondary | ICD-10-CM | POA: Diagnosis not present

## 2024-04-22 DIAGNOSIS — G3184 Mild cognitive impairment, so stated: Secondary | ICD-10-CM | POA: Diagnosis not present

## 2024-04-22 DIAGNOSIS — I70203 Unspecified atherosclerosis of native arteries of extremities, bilateral legs: Secondary | ICD-10-CM | POA: Diagnosis not present

## 2024-04-22 DIAGNOSIS — I701 Atherosclerosis of renal artery: Secondary | ICD-10-CM | POA: Diagnosis not present

## 2024-04-22 DIAGNOSIS — I10 Essential (primary) hypertension: Secondary | ICD-10-CM | POA: Diagnosis not present

## 2024-04-22 DIAGNOSIS — M800AXD Age-related osteoporosis with current pathological fracture, other site, subsequent encounter for fracture with routine healing: Secondary | ICD-10-CM | POA: Diagnosis not present

## 2024-04-22 DIAGNOSIS — I774 Celiac artery compression syndrome: Secondary | ICD-10-CM | POA: Diagnosis not present

## 2024-04-22 DIAGNOSIS — I2510 Atherosclerotic heart disease of native coronary artery without angina pectoris: Secondary | ICD-10-CM | POA: Diagnosis not present

## 2024-04-22 DIAGNOSIS — M8008XD Age-related osteoporosis with current pathological fracture, vertebra(e), subsequent encounter for fracture with routine healing: Secondary | ICD-10-CM | POA: Diagnosis not present

## 2024-04-23 DIAGNOSIS — E039 Hypothyroidism, unspecified: Secondary | ICD-10-CM | POA: Diagnosis not present

## 2024-04-23 DIAGNOSIS — I10 Essential (primary) hypertension: Secondary | ICD-10-CM | POA: Diagnosis not present

## 2024-04-23 DIAGNOSIS — M79672 Pain in left foot: Secondary | ICD-10-CM | POA: Diagnosis not present

## 2024-04-24 DIAGNOSIS — M800AXD Age-related osteoporosis with current pathological fracture, other site, subsequent encounter for fracture with routine healing: Secondary | ICD-10-CM | POA: Diagnosis not present

## 2024-04-24 DIAGNOSIS — G4700 Insomnia, unspecified: Secondary | ICD-10-CM | POA: Diagnosis not present

## 2024-04-24 DIAGNOSIS — I2510 Atherosclerotic heart disease of native coronary artery without angina pectoris: Secondary | ICD-10-CM | POA: Diagnosis not present

## 2024-04-24 DIAGNOSIS — I701 Atherosclerosis of renal artery: Secondary | ICD-10-CM | POA: Diagnosis not present

## 2024-04-24 DIAGNOSIS — I10 Essential (primary) hypertension: Secondary | ICD-10-CM | POA: Diagnosis not present

## 2024-04-24 DIAGNOSIS — G3184 Mild cognitive impairment, so stated: Secondary | ICD-10-CM | POA: Diagnosis not present

## 2024-04-24 DIAGNOSIS — I774 Celiac artery compression syndrome: Secondary | ICD-10-CM | POA: Diagnosis not present

## 2024-04-24 DIAGNOSIS — I70203 Unspecified atherosclerosis of native arteries of extremities, bilateral legs: Secondary | ICD-10-CM | POA: Diagnosis not present

## 2024-04-24 DIAGNOSIS — M8008XD Age-related osteoporosis with current pathological fracture, vertebra(e), subsequent encounter for fracture with routine healing: Secondary | ICD-10-CM | POA: Diagnosis not present

## 2024-04-29 DIAGNOSIS — G3184 Mild cognitive impairment, so stated: Secondary | ICD-10-CM | POA: Diagnosis not present

## 2024-04-29 DIAGNOSIS — I10 Essential (primary) hypertension: Secondary | ICD-10-CM | POA: Diagnosis not present

## 2024-04-29 DIAGNOSIS — I701 Atherosclerosis of renal artery: Secondary | ICD-10-CM | POA: Diagnosis not present

## 2024-04-29 DIAGNOSIS — M8008XD Age-related osteoporosis with current pathological fracture, vertebra(e), subsequent encounter for fracture with routine healing: Secondary | ICD-10-CM | POA: Diagnosis not present

## 2024-04-29 DIAGNOSIS — G4700 Insomnia, unspecified: Secondary | ICD-10-CM | POA: Diagnosis not present

## 2024-04-29 DIAGNOSIS — I774 Celiac artery compression syndrome: Secondary | ICD-10-CM | POA: Diagnosis not present

## 2024-04-29 DIAGNOSIS — I2510 Atherosclerotic heart disease of native coronary artery without angina pectoris: Secondary | ICD-10-CM | POA: Diagnosis not present

## 2024-04-29 DIAGNOSIS — M800AXD Age-related osteoporosis with current pathological fracture, other site, subsequent encounter for fracture with routine healing: Secondary | ICD-10-CM | POA: Diagnosis not present

## 2024-04-29 DIAGNOSIS — I70203 Unspecified atherosclerosis of native arteries of extremities, bilateral legs: Secondary | ICD-10-CM | POA: Diagnosis not present

## 2024-04-30 ENCOUNTER — Telehealth: Payer: Self-pay | Admitting: Internal Medicine

## 2024-04-30 NOTE — Telephone Encounter (Signed)
 Recd paper fax from PCP office 04/24/2024, pt needed to be seen this week for L foot pain. Called Candace at [facility] 04/24/2024, informed no availability this week at our office. Per Candace they will refer elsewhere, closing referral.

## 2024-05-01 DIAGNOSIS — I10 Essential (primary) hypertension: Secondary | ICD-10-CM | POA: Diagnosis not present

## 2024-05-01 DIAGNOSIS — M800AXD Age-related osteoporosis with current pathological fracture, other site, subsequent encounter for fracture with routine healing: Secondary | ICD-10-CM | POA: Diagnosis not present

## 2024-05-01 DIAGNOSIS — G4700 Insomnia, unspecified: Secondary | ICD-10-CM | POA: Diagnosis not present

## 2024-05-01 DIAGNOSIS — M8008XD Age-related osteoporosis with current pathological fracture, vertebra(e), subsequent encounter for fracture with routine healing: Secondary | ICD-10-CM | POA: Diagnosis not present

## 2024-05-01 DIAGNOSIS — I701 Atherosclerosis of renal artery: Secondary | ICD-10-CM | POA: Diagnosis not present

## 2024-05-01 DIAGNOSIS — G3184 Mild cognitive impairment, so stated: Secondary | ICD-10-CM | POA: Diagnosis not present

## 2024-05-01 DIAGNOSIS — I70203 Unspecified atherosclerosis of native arteries of extremities, bilateral legs: Secondary | ICD-10-CM | POA: Diagnosis not present

## 2024-05-01 DIAGNOSIS — I2510 Atherosclerotic heart disease of native coronary artery without angina pectoris: Secondary | ICD-10-CM | POA: Diagnosis not present

## 2024-05-01 DIAGNOSIS — I774 Celiac artery compression syndrome: Secondary | ICD-10-CM | POA: Diagnosis not present

## 2024-05-02 DIAGNOSIS — I10 Essential (primary) hypertension: Secondary | ICD-10-CM | POA: Diagnosis not present

## 2024-05-02 DIAGNOSIS — G3184 Mild cognitive impairment, so stated: Secondary | ICD-10-CM | POA: Diagnosis not present

## 2024-05-02 DIAGNOSIS — M800AXD Age-related osteoporosis with current pathological fracture, other site, subsequent encounter for fracture with routine healing: Secondary | ICD-10-CM | POA: Diagnosis not present

## 2024-05-02 DIAGNOSIS — I701 Atherosclerosis of renal artery: Secondary | ICD-10-CM | POA: Diagnosis not present

## 2024-05-02 DIAGNOSIS — I70203 Unspecified atherosclerosis of native arteries of extremities, bilateral legs: Secondary | ICD-10-CM | POA: Diagnosis not present

## 2024-05-02 DIAGNOSIS — I2510 Atherosclerotic heart disease of native coronary artery without angina pectoris: Secondary | ICD-10-CM | POA: Diagnosis not present

## 2024-05-02 DIAGNOSIS — M8008XD Age-related osteoporosis with current pathological fracture, vertebra(e), subsequent encounter for fracture with routine healing: Secondary | ICD-10-CM | POA: Diagnosis not present

## 2024-05-02 DIAGNOSIS — I774 Celiac artery compression syndrome: Secondary | ICD-10-CM | POA: Diagnosis not present

## 2024-05-02 DIAGNOSIS — G4700 Insomnia, unspecified: Secondary | ICD-10-CM | POA: Diagnosis not present

## 2024-05-03 DIAGNOSIS — I70203 Unspecified atherosclerosis of native arteries of extremities, bilateral legs: Secondary | ICD-10-CM | POA: Diagnosis not present

## 2024-05-03 DIAGNOSIS — G4700 Insomnia, unspecified: Secondary | ICD-10-CM | POA: Diagnosis not present

## 2024-05-03 DIAGNOSIS — I774 Celiac artery compression syndrome: Secondary | ICD-10-CM | POA: Diagnosis not present

## 2024-05-03 DIAGNOSIS — I2510 Atherosclerotic heart disease of native coronary artery without angina pectoris: Secondary | ICD-10-CM | POA: Diagnosis not present

## 2024-05-03 DIAGNOSIS — I10 Essential (primary) hypertension: Secondary | ICD-10-CM | POA: Diagnosis not present

## 2024-05-03 DIAGNOSIS — M800AXD Age-related osteoporosis with current pathological fracture, other site, subsequent encounter for fracture with routine healing: Secondary | ICD-10-CM | POA: Diagnosis not present

## 2024-05-03 DIAGNOSIS — M8008XD Age-related osteoporosis with current pathological fracture, vertebra(e), subsequent encounter for fracture with routine healing: Secondary | ICD-10-CM | POA: Diagnosis not present

## 2024-05-03 DIAGNOSIS — G3184 Mild cognitive impairment, so stated: Secondary | ICD-10-CM | POA: Diagnosis not present

## 2024-05-03 DIAGNOSIS — I701 Atherosclerosis of renal artery: Secondary | ICD-10-CM | POA: Diagnosis not present

## 2024-05-06 DIAGNOSIS — M800AXD Age-related osteoporosis with current pathological fracture, other site, subsequent encounter for fracture with routine healing: Secondary | ICD-10-CM | POA: Diagnosis not present

## 2024-05-06 DIAGNOSIS — I2510 Atherosclerotic heart disease of native coronary artery without angina pectoris: Secondary | ICD-10-CM | POA: Diagnosis not present

## 2024-05-06 DIAGNOSIS — I701 Atherosclerosis of renal artery: Secondary | ICD-10-CM | POA: Diagnosis not present

## 2024-05-06 DIAGNOSIS — G4700 Insomnia, unspecified: Secondary | ICD-10-CM | POA: Diagnosis not present

## 2024-05-06 DIAGNOSIS — I774 Celiac artery compression syndrome: Secondary | ICD-10-CM | POA: Diagnosis not present

## 2024-05-06 DIAGNOSIS — I70203 Unspecified atherosclerosis of native arteries of extremities, bilateral legs: Secondary | ICD-10-CM | POA: Diagnosis not present

## 2024-05-06 DIAGNOSIS — M8008XD Age-related osteoporosis with current pathological fracture, vertebra(e), subsequent encounter for fracture with routine healing: Secondary | ICD-10-CM | POA: Diagnosis not present

## 2024-05-06 DIAGNOSIS — I10 Essential (primary) hypertension: Secondary | ICD-10-CM | POA: Diagnosis not present

## 2024-05-06 DIAGNOSIS — G3184 Mild cognitive impairment, so stated: Secondary | ICD-10-CM | POA: Diagnosis not present

## 2024-05-06 DIAGNOSIS — S329XXD Fracture of unspecified parts of lumbosacral spine and pelvis, subsequent encounter for fracture with routine healing: Secondary | ICD-10-CM | POA: Diagnosis not present

## 2024-05-07 DIAGNOSIS — I70203 Unspecified atherosclerosis of native arteries of extremities, bilateral legs: Secondary | ICD-10-CM | POA: Diagnosis not present

## 2024-05-07 DIAGNOSIS — I2510 Atherosclerotic heart disease of native coronary artery without angina pectoris: Secondary | ICD-10-CM | POA: Diagnosis not present

## 2024-05-07 DIAGNOSIS — M800AXD Age-related osteoporosis with current pathological fracture, other site, subsequent encounter for fracture with routine healing: Secondary | ICD-10-CM | POA: Diagnosis not present

## 2024-05-07 DIAGNOSIS — G4700 Insomnia, unspecified: Secondary | ICD-10-CM | POA: Diagnosis not present

## 2024-05-07 DIAGNOSIS — M8008XD Age-related osteoporosis with current pathological fracture, vertebra(e), subsequent encounter for fracture with routine healing: Secondary | ICD-10-CM | POA: Diagnosis not present

## 2024-05-07 DIAGNOSIS — I774 Celiac artery compression syndrome: Secondary | ICD-10-CM | POA: Diagnosis not present

## 2024-05-07 DIAGNOSIS — I10 Essential (primary) hypertension: Secondary | ICD-10-CM | POA: Diagnosis not present

## 2024-05-07 DIAGNOSIS — G3184 Mild cognitive impairment, so stated: Secondary | ICD-10-CM | POA: Diagnosis not present

## 2024-05-07 DIAGNOSIS — I701 Atherosclerosis of renal artery: Secondary | ICD-10-CM | POA: Diagnosis not present

## 2024-05-08 DIAGNOSIS — G3184 Mild cognitive impairment, so stated: Secondary | ICD-10-CM | POA: Diagnosis not present

## 2024-05-08 DIAGNOSIS — G4700 Insomnia, unspecified: Secondary | ICD-10-CM | POA: Diagnosis not present

## 2024-05-08 DIAGNOSIS — I70203 Unspecified atherosclerosis of native arteries of extremities, bilateral legs: Secondary | ICD-10-CM | POA: Diagnosis not present

## 2024-05-08 DIAGNOSIS — I2510 Atherosclerotic heart disease of native coronary artery without angina pectoris: Secondary | ICD-10-CM | POA: Diagnosis not present

## 2024-05-08 DIAGNOSIS — M8008XD Age-related osteoporosis with current pathological fracture, vertebra(e), subsequent encounter for fracture with routine healing: Secondary | ICD-10-CM | POA: Diagnosis not present

## 2024-05-08 DIAGNOSIS — I774 Celiac artery compression syndrome: Secondary | ICD-10-CM | POA: Diagnosis not present

## 2024-05-08 DIAGNOSIS — M800AXD Age-related osteoporosis with current pathological fracture, other site, subsequent encounter for fracture with routine healing: Secondary | ICD-10-CM | POA: Diagnosis not present

## 2024-05-08 DIAGNOSIS — I701 Atherosclerosis of renal artery: Secondary | ICD-10-CM | POA: Diagnosis not present

## 2024-05-08 DIAGNOSIS — I10 Essential (primary) hypertension: Secondary | ICD-10-CM | POA: Diagnosis not present

## 2024-05-09 DIAGNOSIS — M800AXD Age-related osteoporosis with current pathological fracture, other site, subsequent encounter for fracture with routine healing: Secondary | ICD-10-CM | POA: Diagnosis not present

## 2024-05-09 DIAGNOSIS — I701 Atherosclerosis of renal artery: Secondary | ICD-10-CM | POA: Diagnosis not present

## 2024-05-09 DIAGNOSIS — I774 Celiac artery compression syndrome: Secondary | ICD-10-CM | POA: Diagnosis not present

## 2024-05-09 DIAGNOSIS — I10 Essential (primary) hypertension: Secondary | ICD-10-CM | POA: Diagnosis not present

## 2024-05-09 DIAGNOSIS — G3184 Mild cognitive impairment, so stated: Secondary | ICD-10-CM | POA: Diagnosis not present

## 2024-05-09 DIAGNOSIS — M8008XD Age-related osteoporosis with current pathological fracture, vertebra(e), subsequent encounter for fracture with routine healing: Secondary | ICD-10-CM | POA: Diagnosis not present

## 2024-05-09 DIAGNOSIS — I2510 Atherosclerotic heart disease of native coronary artery without angina pectoris: Secondary | ICD-10-CM | POA: Diagnosis not present

## 2024-05-09 DIAGNOSIS — I70203 Unspecified atherosclerosis of native arteries of extremities, bilateral legs: Secondary | ICD-10-CM | POA: Diagnosis not present

## 2024-05-09 DIAGNOSIS — G4700 Insomnia, unspecified: Secondary | ICD-10-CM | POA: Diagnosis not present

## 2024-05-13 DIAGNOSIS — I10 Essential (primary) hypertension: Secondary | ICD-10-CM | POA: Diagnosis not present

## 2024-05-13 DIAGNOSIS — I70203 Unspecified atherosclerosis of native arteries of extremities, bilateral legs: Secondary | ICD-10-CM | POA: Diagnosis not present

## 2024-05-13 DIAGNOSIS — I701 Atherosclerosis of renal artery: Secondary | ICD-10-CM | POA: Diagnosis not present

## 2024-05-13 DIAGNOSIS — G3184 Mild cognitive impairment, so stated: Secondary | ICD-10-CM | POA: Diagnosis not present

## 2024-05-13 DIAGNOSIS — M8008XD Age-related osteoporosis with current pathological fracture, vertebra(e), subsequent encounter for fracture with routine healing: Secondary | ICD-10-CM | POA: Diagnosis not present

## 2024-05-13 DIAGNOSIS — I2510 Atherosclerotic heart disease of native coronary artery without angina pectoris: Secondary | ICD-10-CM | POA: Diagnosis not present

## 2024-05-13 DIAGNOSIS — I774 Celiac artery compression syndrome: Secondary | ICD-10-CM | POA: Diagnosis not present

## 2024-05-13 DIAGNOSIS — M800AXD Age-related osteoporosis with current pathological fracture, other site, subsequent encounter for fracture with routine healing: Secondary | ICD-10-CM | POA: Diagnosis not present

## 2024-05-13 DIAGNOSIS — G4700 Insomnia, unspecified: Secondary | ICD-10-CM | POA: Diagnosis not present

## 2024-05-14 DIAGNOSIS — M800AXD Age-related osteoporosis with current pathological fracture, other site, subsequent encounter for fracture with routine healing: Secondary | ICD-10-CM | POA: Diagnosis not present

## 2024-05-14 DIAGNOSIS — I10 Essential (primary) hypertension: Secondary | ICD-10-CM | POA: Diagnosis not present

## 2024-05-14 DIAGNOSIS — M8008XD Age-related osteoporosis with current pathological fracture, vertebra(e), subsequent encounter for fracture with routine healing: Secondary | ICD-10-CM | POA: Diagnosis not present

## 2024-05-14 DIAGNOSIS — G4700 Insomnia, unspecified: Secondary | ICD-10-CM | POA: Diagnosis not present

## 2024-05-14 DIAGNOSIS — I70203 Unspecified atherosclerosis of native arteries of extremities, bilateral legs: Secondary | ICD-10-CM | POA: Diagnosis not present

## 2024-05-14 DIAGNOSIS — I2510 Atherosclerotic heart disease of native coronary artery without angina pectoris: Secondary | ICD-10-CM | POA: Diagnosis not present

## 2024-05-14 DIAGNOSIS — I774 Celiac artery compression syndrome: Secondary | ICD-10-CM | POA: Diagnosis not present

## 2024-05-14 DIAGNOSIS — I701 Atherosclerosis of renal artery: Secondary | ICD-10-CM | POA: Diagnosis not present

## 2024-05-14 DIAGNOSIS — G3184 Mild cognitive impairment, so stated: Secondary | ICD-10-CM | POA: Diagnosis not present

## 2024-05-15 DIAGNOSIS — I2510 Atherosclerotic heart disease of native coronary artery without angina pectoris: Secondary | ICD-10-CM | POA: Diagnosis not present

## 2024-05-15 DIAGNOSIS — I10 Essential (primary) hypertension: Secondary | ICD-10-CM | POA: Diagnosis not present

## 2024-05-15 DIAGNOSIS — G3184 Mild cognitive impairment, so stated: Secondary | ICD-10-CM | POA: Diagnosis not present

## 2024-05-15 DIAGNOSIS — I70203 Unspecified atherosclerosis of native arteries of extremities, bilateral legs: Secondary | ICD-10-CM | POA: Diagnosis not present

## 2024-05-15 DIAGNOSIS — I774 Celiac artery compression syndrome: Secondary | ICD-10-CM | POA: Diagnosis not present

## 2024-05-15 DIAGNOSIS — I701 Atherosclerosis of renal artery: Secondary | ICD-10-CM | POA: Diagnosis not present

## 2024-05-15 DIAGNOSIS — M8008XD Age-related osteoporosis with current pathological fracture, vertebra(e), subsequent encounter for fracture with routine healing: Secondary | ICD-10-CM | POA: Diagnosis not present

## 2024-05-15 DIAGNOSIS — G4700 Insomnia, unspecified: Secondary | ICD-10-CM | POA: Diagnosis not present

## 2024-05-15 DIAGNOSIS — M800AXD Age-related osteoporosis with current pathological fracture, other site, subsequent encounter for fracture with routine healing: Secondary | ICD-10-CM | POA: Diagnosis not present

## 2024-05-20 DIAGNOSIS — G3184 Mild cognitive impairment, so stated: Secondary | ICD-10-CM | POA: Diagnosis not present

## 2024-05-20 DIAGNOSIS — I701 Atherosclerosis of renal artery: Secondary | ICD-10-CM | POA: Diagnosis not present

## 2024-05-20 DIAGNOSIS — G4700 Insomnia, unspecified: Secondary | ICD-10-CM | POA: Diagnosis not present

## 2024-05-20 DIAGNOSIS — M8008XD Age-related osteoporosis with current pathological fracture, vertebra(e), subsequent encounter for fracture with routine healing: Secondary | ICD-10-CM | POA: Diagnosis not present

## 2024-05-20 DIAGNOSIS — M800AXD Age-related osteoporosis with current pathological fracture, other site, subsequent encounter for fracture with routine healing: Secondary | ICD-10-CM | POA: Diagnosis not present

## 2024-05-20 DIAGNOSIS — I2510 Atherosclerotic heart disease of native coronary artery without angina pectoris: Secondary | ICD-10-CM | POA: Diagnosis not present

## 2024-05-20 DIAGNOSIS — I774 Celiac artery compression syndrome: Secondary | ICD-10-CM | POA: Diagnosis not present

## 2024-05-20 DIAGNOSIS — I10 Essential (primary) hypertension: Secondary | ICD-10-CM | POA: Diagnosis not present

## 2024-05-20 DIAGNOSIS — I70203 Unspecified atherosclerosis of native arteries of extremities, bilateral legs: Secondary | ICD-10-CM | POA: Diagnosis not present

## 2024-05-21 DIAGNOSIS — I701 Atherosclerosis of renal artery: Secondary | ICD-10-CM | POA: Diagnosis not present

## 2024-05-21 DIAGNOSIS — I774 Celiac artery compression syndrome: Secondary | ICD-10-CM | POA: Diagnosis not present

## 2024-05-21 DIAGNOSIS — G4700 Insomnia, unspecified: Secondary | ICD-10-CM | POA: Diagnosis not present

## 2024-05-21 DIAGNOSIS — M8008XD Age-related osteoporosis with current pathological fracture, vertebra(e), subsequent encounter for fracture with routine healing: Secondary | ICD-10-CM | POA: Diagnosis not present

## 2024-05-21 DIAGNOSIS — I10 Essential (primary) hypertension: Secondary | ICD-10-CM | POA: Diagnosis not present

## 2024-05-21 DIAGNOSIS — M800AXD Age-related osteoporosis with current pathological fracture, other site, subsequent encounter for fracture with routine healing: Secondary | ICD-10-CM | POA: Diagnosis not present

## 2024-05-21 DIAGNOSIS — I70203 Unspecified atherosclerosis of native arteries of extremities, bilateral legs: Secondary | ICD-10-CM | POA: Diagnosis not present

## 2024-05-21 DIAGNOSIS — I2510 Atherosclerotic heart disease of native coronary artery without angina pectoris: Secondary | ICD-10-CM | POA: Diagnosis not present

## 2024-05-21 DIAGNOSIS — G3184 Mild cognitive impairment, so stated: Secondary | ICD-10-CM | POA: Diagnosis not present

## 2024-05-22 DIAGNOSIS — M8008XD Age-related osteoporosis with current pathological fracture, vertebra(e), subsequent encounter for fracture with routine healing: Secondary | ICD-10-CM | POA: Diagnosis not present

## 2024-05-22 DIAGNOSIS — G3184 Mild cognitive impairment, so stated: Secondary | ICD-10-CM | POA: Diagnosis not present

## 2024-05-22 DIAGNOSIS — I774 Celiac artery compression syndrome: Secondary | ICD-10-CM | POA: Diagnosis not present

## 2024-05-22 DIAGNOSIS — I70203 Unspecified atherosclerosis of native arteries of extremities, bilateral legs: Secondary | ICD-10-CM | POA: Diagnosis not present

## 2024-05-22 DIAGNOSIS — M800AXD Age-related osteoporosis with current pathological fracture, other site, subsequent encounter for fracture with routine healing: Secondary | ICD-10-CM | POA: Diagnosis not present

## 2024-05-22 DIAGNOSIS — G4700 Insomnia, unspecified: Secondary | ICD-10-CM | POA: Diagnosis not present

## 2024-05-22 DIAGNOSIS — I2510 Atherosclerotic heart disease of native coronary artery without angina pectoris: Secondary | ICD-10-CM | POA: Diagnosis not present

## 2024-05-22 DIAGNOSIS — I10 Essential (primary) hypertension: Secondary | ICD-10-CM | POA: Diagnosis not present

## 2024-05-22 DIAGNOSIS — I701 Atherosclerosis of renal artery: Secondary | ICD-10-CM | POA: Diagnosis not present

## 2024-06-04 DIAGNOSIS — S329XXD Fracture of unspecified parts of lumbosacral spine and pelvis, subsequent encounter for fracture with routine healing: Secondary | ICD-10-CM | POA: Diagnosis not present

## 2024-07-23 DIAGNOSIS — S329XXD Fracture of unspecified parts of lumbosacral spine and pelvis, subsequent encounter for fracture with routine healing: Secondary | ICD-10-CM | POA: Diagnosis not present

## 2024-08-05 ENCOUNTER — Other Ambulatory Visit: Payer: Self-pay

## 2024-08-05 ENCOUNTER — Ambulatory Visit: Attending: Internal Medicine | Admitting: Physical Therapy

## 2024-08-05 DIAGNOSIS — M6281 Muscle weakness (generalized): Secondary | ICD-10-CM | POA: Diagnosis not present

## 2024-08-05 DIAGNOSIS — M5459 Other low back pain: Secondary | ICD-10-CM | POA: Diagnosis not present

## 2024-08-05 DIAGNOSIS — R2689 Other abnormalities of gait and mobility: Secondary | ICD-10-CM | POA: Diagnosis not present

## 2024-08-05 NOTE — Therapy (Signed)
 OUTPATIENT PHYSICAL THERAPY THORACOLUMBAR EVALUATION   Patient Name: Danielle Proctor MRN: [id number] DOB:October 23, 1928, 89 y.o., female Today's Date: 08/05/2024  END OF SESSION:  PT End of Session - 08/05/24 1338     Visit Number 1    Number of Visits 17    Date for Recertification  09/30/24    Authorization Type [employer] MCR    Progress Note Due on Visit 10    PT Start Time 1332    PT Stop Time 1421    PT Time Calculation (min) 49 min    Activity Tolerance Patient tolerated treatment well    Behavior During Therapy [facility] for tasks assessed/performed;Impulsive          Past Medical History:  Diagnosis Date   Atherosclerotic peripheral vascular disease    Diverticulosis    Hypercholesteremia    Hypertension    IBS (irritable bowel syndrome)    Osteoarthritis    Osteoporosis    Thyroid  disease    Past Surgical History:  Procedure Laterality Date   APPENDECTOMY     CHOLECYSTECTOMY     COLON SURGERY     ORIF PELVIC FRACTURE WITH PERCUTANEOUS SCREWS Bilateral 04/12/2024   Procedure: CLOSED REDUCTION, PELVIS, WITH PERCUTANEOUS FIXATION;  Surgeon: Kendal Franky SQUIBB, MD;  Location: [facility] OR;  Service: Orthopedics;  Laterality: Bilateral;   Patient Active Problem List   Diagnosis Date Noted   Fall 04/11/2024   Closed fracture of transverse process of lumbar vertebra (HCC) 04/11/2024   Sacral fracture (HCC) 04/10/2024   Memory loss 03/07/2024   Hypertension 05/23/2022    PCP: Clarice Nottingham, MD  REFERRING PROVIDER: Kendal Franky SQUIBB, MD   REFERRING DIAG: Pelvic fracture (HCC) [D67.9XXA]   Rationale for Evaluation and Treatment: Rehabilitation  THERAPY DIAG:  Other low back pain  Muscle weakness (generalized)  Other abnormalities of gait and mobility  ONSET DATE: DOS 04/12/24  /SUBJECTIVE:                                                                                                                                                                                            SUBJECTIVE STATEMENT: Pt reports she fell backward and landed on her bottom and noted immediate pain and went to the ED she had ORIF of the sacrum on 04/12/24. She had home health PT for a few visits. She reports 4/10 but the pain fluctuates but doesn't limit her. Since the surgery she has noted significant improvement with RW. Prior to this injury patient was independent with no AD.   PERTINENT HISTORY:  Osteoporosis see PMHx  PAIN:  Are you having pain? Yes: NPRS scale: 4/10  currently, 4/10  Pain location: on the L low back/ sacrum Pain description: little  Aggravating factors: sitting on a firm surface Relieving factors: Tylenol , aspercreme  PRECAUTIONS: Fall  RED FLAGS: None   WEIGHT BEARING RESTRICTIONS: No  FALLS:  Has patient fallen in last 6 months? Yes. Number of falls 1  LIVING ENVIRONMENT: Lives with: lives with an adult companion Lives in: House/apartment Stairs: Yes: External: 3 steps; on right going up Has following equipment at home: Vannie - 2 wheeled and SBQD, shower chair.   OCCUPATION: retired  PLOF: Independent with basic ADLs  PATIENT GOALS: to be able to take a bath,    OBJECTIVE:  Note: Objective measures were completed at Evaluation unless otherwise noted.  DIAGNOSTIC FINDINGS:  See chart  PATIENT SURVEYS:  LEFS  Extreme difficulty/unable (0), Quite a bit of difficulty (1), Moderate difficulty (2), Little difficulty (3), No difficulty (4) Survey date:  08/05/24  Any of your usual work, housework or school activities 2  2. Usual hobbies, recreational or sporting activities 1  3. Getting into/out of the bath 0  4. Walking between rooms 3  5. Putting on socks/shoes 4  6. Squatting  3  7. Lifting an object, like a bag of groceries from the floor 1  8. Performing light activities around your home 3  9. Performing heavy activities around your home 0  10. Getting into/out of a car 3  11. Walking 2 blocks 4  12. Walking 1 mile 0  13. Going  up/down 10 stairs (1 flight) 2  14. Standing for 1 hour 0  15.  sitting for 1 hour 4  16. Running on even ground 0  17. Running on uneven ground 0  18. Making sharp turns while running fast 0  19. Hopping  0  20. Rolling over in bed 4  Score total:  34/80     COGNITION: Overall cognitive status: Within functional limits for tasks assessed     SENSATION: Not tested    POSTURE: rounded shoulders and forward head  PALPATION: Standing: Increased resting tension in L paraspinals group. TTP L PSIS. Sitting: TTP PSIS L>R.   LUMBAR ROM:   AROM eval  Flexion 80  Extension   Right lateral flexion 10  Left lateral flexion 30  Right rotation   Left rotation    (Blank rows = not tested)  LOWER EXTREMITY ROM:     Active  Right eval Left eval  Hip flexion    Hip extension    Hip abduction    Hip adduction    Hip internal rotation    Hip external rotation    Knee flexion    Knee extension    Ankle dorsiflexion    Ankle plantarflexion    Ankle inversion    Ankle eversion     (Blank rows = not tested)  LOWER EXTREMITY MMT:    MMT Right eval Left eval  Hip flexion 4+/5 4-/5  Hip extension    Hip abduction 4-/5 3+/5  Hip adduction 5/5 4/5  Hip internal rotation    Hip external rotation    Knee flexion 4+/5 4/5  Knee extension 4/5 4-/5  Ankle dorsiflexion 5/5 5/5  Ankle plantarflexion 5/5 5/5  Ankle inversion    Ankle eversion     (Blank rows = not tested)  LUMBAR SPECIAL TESTS:    FUNCTIONAL TESTS:  5 times sit to stand: 8.0s from elevated surface, 11s from standard chair height, no HHA. Timed up and go (  TUG): 36s with NBQC  GAIT: Distance walked: From Lobby to assessment room Assistive device utilized: Retail banker - 2 wheeled Level of assistance: Modified independence with RW and SBA with quad cane Comments: With RW pt has wide BOS, uses reciprocal pattern with inc EV in stance phase. Uses dec cadence, but maintains upright posture  without LOB during reaching or directional changes. Gait speed decreases significantly with use of NBQC in RUE demonstrating dec stride length greater on L vs right, slight forward flexed position following change in AD.    TREATMENT:  [facility] Adult PT Treatment:                                                DATE: 08/05/24 Provided initial HEP                                                                                                                                  PATIENT EDUCATION:  Education details: evaluation findings, POC, goals, HEP with proper form/ rationale.  Person educated: Patient and Child(ren) Education method: Explanation, Demonstration, Verbal cues, and Handouts Education comprehension: verbalized understanding  HOME EXERCISE PROGRAM: Access Code: [email or web address] URL: [email or web address] Date: 08/05/2024 Prepared by: Joneen Fresh  Exercises - Narrow Stance with Counter Support  - 1 x daily - 7 x weekly - 1 sets - 3 reps - 30 hold - Clam  - 4 x weekly - 3 sets - 8 reps - 2 hold - Semi-Tandem Balance at Counter Top Eyes Open  - 1 x daily - 7 x weekly - 1 sets - 45 reps - Sit to Stand  - 4 x weekly - 3 sets - 6 reps  ASSESSMENT:  CLINICAL IMPRESSION: Patient is a 89 y.o. F who was seen today for physical therapy evaluation and treatment for dx of pelvic fx with ORIF of sacrum on 04/12/24. She has functional trunk mobilty with the exception of R sidebending secondary to L lumabr paraspinal tension. LE strength is [facility] but has room for improvement, she demonstrates limited endurance/ stamina as well as confidence with balance without her RW. Practiced us  NBQC exhibiting and large step length on the L compared bil requiring frequent verbal and tactile cues on proper gait pattern during assessment. She would benefit from physical therapy do promote strength and endurance, maximize stability and safety with gait with LRAD, and maximize her function by addressing  the deficits listed.   OBJECTIVE IMPAIRMENTS: Abnormal gait, decreased activity tolerance, decreased balance, decreased endurance, decreased knowledge of use of DME, decreased ROM, decreased strength, increased fascial restrictions, increased muscle spasms, and pain.   ACTIVITY LIMITATIONS: carrying, lifting, standing, stairs, bathing, and locomotion level  PARTICIPATION LIMITATIONS: meal prep, cleaning, laundry, shopping, and community activity  PERSONAL FACTORS: Age, Past/current experiences, and 1-2  comorbidities: Falls, and osteoporosis are also affecting patient's functional outcome.   REHAB POTENTIAL: Excellent  CLINICAL DECISION MAKING: Evolving/moderate complexity  EVALUATION COMPLEXITY: Moderate   GOALS: Goals reviewed with patient? Yes  SHORT TERM GOALS: Target date: 09/02/2024  Pt will be IND with initial HEP for therapeutic progression Baseline: no prior HEP Goal status: INITIAL  2.  Pt to verbalize and demo efficient gait pattern with LRAD to maximize safety and balance while increasing mobility.  Baseline:  Goal status: INITIAL  3.  Pt to be report pain to </= 2/10 with standing/ walking and sitting on hard surfaces to demo improving condition.  Baseline:  Goal status: INITIAL   LONG TERM GOALS: Target date: 09/30/2024  Pt to increase bil LE strength to to >/= 4+/5 to promote stability with walking/ standing to maximize safety. Baseline:  Goal status: INITIAL  2.  Improve LEFS to >/= 55/80 to demonstrate improvement in function.  Baseline:  Goal status: INITIAL  3.  Reduce tug time to </= 16 sec with LRAD for safety and stability with walking/ standing  Baseline:  Goal status: INITIAL  4.  Pt to be able to navigate obstacles of varying heights and firm and unlevel surfaces with LRAD and report feeling stabile and no fear of falling.  Baseline:  Goal status: INITIAL   5.  PT to be IND with all HEP given and is able to maintain and progress their  current LOF IND.  Baseline:  Goal status: INITIAL    PLAN:  PT FREQUENCY: 1-2x/week  PT DURATION: 12 weeks  PLANNED INTERVENTIONS: 97110-Therapeutic exercises, 97530- Therapeutic activity, [id number]- Neuromuscular re-education, 97535- Self Care, 02859- Manual therapy, [id number]- Gait training, [phone]- Electrical stimulation (unattended), 20560 (1-2 muscles), 20561 (3+ muscles)- Dry Needling, Patient/Family education, Cryotherapy, and Moist heat.  PLAN FOR NEXT SESSION: Review/ update HEP PRN. Gait training with SBQD, gross hip strengthening, static/ dynamic balance.    Meaghan Whistler PT, DPT, LAT, ATC  08/05/24  4:01 PM     Referring diagnosis? Pelvic fracture (HCC) [S32.9XXA]  Treatment diagnosis? (if different than referring diagnosis) Other low back pain M54.59, gen muscle weakness M62.81, Other abnormalities of gait R26.89 What was this (referring dx) caused by? [x]  Surgery [x]  Fall []  Ongoing issue []  Arthritis []  Other: ____________  Laterality: []  Rt []  Lt [x]  Both  Check all possible CPT codes:  *CHOOSE 10 OR LESS*    See Planned Interventions listed in the Plan section of the Evaluation.

## 2024-08-07 DIAGNOSIS — M810 Age-related osteoporosis without current pathological fracture: Secondary | ICD-10-CM | POA: Diagnosis not present

## 2024-08-07 DIAGNOSIS — Z8249 Family history of ischemic heart disease and other diseases of the circulatory system: Secondary | ICD-10-CM | POA: Diagnosis not present

## 2024-08-07 DIAGNOSIS — Z7982 Long term (current) use of aspirin: Secondary | ICD-10-CM | POA: Diagnosis not present

## 2024-08-07 DIAGNOSIS — Z79890 Hormone replacement therapy (postmenopausal): Secondary | ICD-10-CM | POA: Diagnosis not present

## 2024-08-07 DIAGNOSIS — M1990 Unspecified osteoarthritis, unspecified site: Secondary | ICD-10-CM | POA: Diagnosis not present

## 2024-08-07 DIAGNOSIS — I10 Essential (primary) hypertension: Secondary | ICD-10-CM | POA: Diagnosis not present

## 2024-08-07 DIAGNOSIS — F411 Generalized anxiety disorder: Secondary | ICD-10-CM | POA: Diagnosis not present

## 2024-08-07 DIAGNOSIS — R269 Unspecified abnormalities of gait and mobility: Secondary | ICD-10-CM | POA: Diagnosis not present

## 2024-08-07 DIAGNOSIS — Z833 Family history of diabetes mellitus: Secondary | ICD-10-CM | POA: Diagnosis not present

## 2024-08-07 DIAGNOSIS — E039 Hypothyroidism, unspecified: Secondary | ICD-10-CM | POA: Diagnosis not present

## 2024-08-07 DIAGNOSIS — Z9989 Dependence on other enabling machines and devices: Secondary | ICD-10-CM | POA: Diagnosis not present

## 2024-08-07 DIAGNOSIS — G3184 Mild cognitive impairment, so stated: Secondary | ICD-10-CM | POA: Diagnosis not present

## 2024-08-13 ENCOUNTER — Ambulatory Visit: Admitting: Physical Therapy

## 2024-08-13 ENCOUNTER — Encounter: Payer: Self-pay | Admitting: Physical Therapy

## 2024-08-13 DIAGNOSIS — M5459 Other low back pain: Secondary | ICD-10-CM

## 2024-08-13 DIAGNOSIS — M6281 Muscle weakness (generalized): Secondary | ICD-10-CM

## 2024-08-13 DIAGNOSIS — R2689 Other abnormalities of gait and mobility: Secondary | ICD-10-CM

## 2024-08-13 NOTE — Therapy (Addendum)
 OUTPATIENT PHYSICAL THERAPY THORACOLUMBAR EVALUATION   Patient Name: Danielle Proctor MRN: [id number] DOB:09-06-1929, 89 y.o., female Today's Date: 08/13/2024  END OF SESSION:  PT End of Session - 08/13/24 1357     Visit Number 2    Number of Visits 17    Date for Recertification  09/30/24    Authorization Type [employer] MCR    Progress Note Due on Visit 10    PT Start Time 1400    PT Stop Time 1448    PT Time Calculation (min) 48 min    Activity Tolerance Patient tolerated treatment well    Behavior During Therapy [facility] for tasks assessed/performed;Impulsive           Past Medical History:  Diagnosis Date   Atherosclerotic peripheral vascular disease    Diverticulosis    Hypercholesteremia    Hypertension    IBS (irritable bowel syndrome)    Osteoarthritis    Osteoporosis    Thyroid  disease    Past Surgical History:  Procedure Laterality Date   APPENDECTOMY     CHOLECYSTECTOMY     COLON SURGERY     ORIF PELVIC FRACTURE WITH PERCUTANEOUS SCREWS Bilateral 04/12/2024   Procedure: CLOSED REDUCTION, PELVIS, WITH PERCUTANEOUS FIXATION;  Surgeon: Kendal Franky SQUIBB, MD;  Location: [facility] OR;  Service: Orthopedics;  Laterality: Bilateral;   Patient Active Problem List   Diagnosis Date Noted   Fall 04/11/2024   Closed fracture of transverse process of lumbar vertebra (HCC) 04/11/2024   Sacral fracture (HCC) 04/10/2024   Memory loss 03/07/2024   Hypertension 05/23/2022    PCP: Clarice Nottingham, MD  REFERRING PROVIDER: Kendal Franky SQUIBB, MD   REFERRING DIAG: Pelvic fracture (HCC) [D67.9XXA]   Rationale for Evaluation and Treatment: Rehabilitation  THERAPY DIAG:  Other low back pain  Muscle weakness (generalized)  Other abnormalities of gait and mobility  ONSET DATE: DOS 04/12/24  SUBJECTIVE:                                                                                                                                                                                            SUBJECTIVE STATEMENT: Pt states that she has been having intermittent pain in her left hip which she attributes to the weather and is more sensitive since injury onset. Pain reports as 1-2/10 in the lumbar spine/SIJ region. Pt reports compliance with HEP. Pt reports intermittent light headedness with changes in position.  Eval: Pt reports she fell backward and landed on her bottom and noted immediate pain and went to the ED she had ORIF of the sacrum on 04/12/24. She had home health PT for a  few visits. She reports 4/10 but the pain fluctuates but doesn't limit her. Since the surgery she has noted significant improvement with RW. Prior to this injury patient was independent with no AD.   PERTINENT HISTORY:  Osteoporosis see PMHx  PAIN:  Are you having pain? Yes: NPRS scale: 1-2/10 Pain location: on the L low back/ sacrum Pain description: little  Aggravating factors: sitting on a firm surface Relieving factors: Tylenol , aspercreme  PRECAUTIONS: Fall  RED FLAGS: None   WEIGHT BEARING RESTRICTIONS: No  FALLS:  Has patient fallen in last 6 months? Yes. Number of falls 1  LIVING ENVIRONMENT: Lives with: lives with an adult companion Lives in: House/apartment Stairs: Yes: External: 3 steps; on right going up Has following equipment at home: Vannie - 2 wheeled and SBQD, shower chair.   OCCUPATION: retired  PLOF: Independent with basic ADLs  PATIENT GOALS: to be able to take a bath   OBJECTIVE:  Note: Objective measures were completed at Evaluation unless otherwise noted.  DIAGNOSTIC FINDINGS:  See chart  PATIENT SURVEYS:  LEFS  Extreme difficulty/unable (0), Quite a bit of difficulty (1), Moderate difficulty (2), Little difficulty (3), No difficulty (4) Survey date:  08/05/24  Any of your usual work, housework or school activities 2  2. Usual hobbies, recreational or sporting activities 1  3. Getting into/out of the bath 0  4. Walking between rooms 3  5. Putting on  socks/shoes 4  6. Squatting  3  7. Lifting an object, like a bag of groceries from the floor 1  8. Performing light activities around your home 3  9. Performing heavy activities around your home 0  10. Getting into/out of a car 3  11. Walking 2 blocks 4  12. Walking 1 mile 0  13. Going up/down 10 stairs (1 flight) 2  14. Standing for 1 hour 0  15.  sitting for 1 hour 4  16. Running on even ground 0  17. Running on uneven ground 0  18. Making sharp turns while running fast 0  19. Hopping  0  20. Rolling over in bed 4  Score total:  34/80     COGNITION: Overall cognitive status: Within functional limits for tasks assessed     SENSATION: Not tested    POSTURE: rounded shoulders and forward head  PALPATION: Standing: Increased resting tension in L paraspinals group. TTP L PSIS. Sitting: TTP PSIS L>R.   LUMBAR ROM:   AROM eval  Flexion 80  Extension   Right lateral flexion 10  Left lateral flexion 30  Right rotation   Left rotation    (Blank rows = not tested)  LOWER EXTREMITY ROM:     Active  Right eval Left eval  Hip flexion    Hip extension    Hip abduction    Hip adduction    Hip internal rotation    Hip external rotation    Knee flexion    Knee extension    Ankle dorsiflexion    Ankle plantarflexion    Ankle inversion    Ankle eversion     (Blank rows = not tested)  LOWER EXTREMITY MMT:    MMT Right eval Left eval  Hip flexion 4+/5 4-/5  Hip extension    Hip abduction 4-/5 3+/5  Hip adduction 5/5 4/5  Hip internal rotation    Hip external rotation    Knee flexion 4+/5 4/5  Knee extension 4/5 4-/5  Ankle dorsiflexion 5/5 5/5  Ankle plantarflexion 5/5  5/5  Ankle inversion    Ankle eversion     (Blank rows = not tested)  LUMBAR SPECIAL TESTS:    FUNCTIONAL TESTS:  5 times sit to stand: 8.0s from elevated surface, 11s from standard chair height, no HHA. Timed up and go (TUG): 36s with NBQC  GAIT: Distance walked: From Lobby to  assessment room Assistive device utilized: Retail Banker - 2 wheeled Level of assistance: Modified independence with RW and SBA with quad cane Comments: With RW pt has wide BOS, uses reciprocal pattern with inc EV in stance phase. Uses dec cadence, but maintains upright posture without LOB during reaching or directional changes. Gait speed decreases significantly with use of NBQC in RUE demonstrating dec stride length greater on L vs right, slight forward flexed position following change in AD.    TREATMENT: 08/13/2024    [facility] Adult PT Treatment:                                                DATE: 08/13/24 Therapeutic Exercise: SLR in supine 3x10 BLE alternating between sets, notes inc pull in L hip flexors.  Supine physioball press x30, 3 sec hold for core strength and stability Seated W stretch with physioball x12 cycles to promote elongation to the lats and paraspinals.  HEP updated and provided  Therapeutic Activity: Pt completes functional activity of stepping into and out of bath-using bar bell as simulated tub edge. Pt has good ability to enter the tub, but has inc apprehension when exiting. In addition to consideration of feeling light headed with positional changes, pt would benefit from holding on tub baths for the time being until improved tolerance to transfers is demonstrated.  Pt completes unilateral knee flexion and extension on physioball for distal motor control and progressive ROM tolerance x30 BLE.     [facility] Adult PT Treatment:                                                DATE: 08/05/24 Provided initial HEP                                                                                                                                  PATIENT EDUCATION:  Education details: evaluation findings, POC, goals, HEP with proper form/ rationale.  Person educated: Patient and Child(ren) Education method: Explanation, Demonstration, Verbal cues, and  Handouts Education comprehension: verbalized understanding  HOME EXERCISE PROGRAM: Access Code: [email or web address] URL: [email or web address] Date: 08/13/2024 Prepared by: Stann Ohara  Exercises - Clam  - 4 x weekly - 3 sets - 8 reps - 2 hold - Sit to Stand  - 4  x weekly - 3 sets - 6 reps - Standing March with Counter Support  - 1 x daily - 7 x weekly - 1 sets - 10 reps - 5 hold - Standing 3-way Hip with Walker  - 1 x daily - 7 x weekly - 1 sets - 10 reps - 2 hold  ASSESSMENT:  CLINICAL IMPRESSION: Pt has been progressing well since initial visit and report decreased pain and compliance with HEP. Pt demonstrates symptoms with activity consistent with tissue remodeling principles using a prod, not worse response to activity. Pt requires cues throughout to maintain quality of movement and task. Able to demonstrate progressing tolerance to single leg stance positions with slight HHA for support. Pt remains unsafe to ind complete tub transfers due to balance and apprehension with stepping out of the tub and presence of intermittent light headedness with positional changes. Did not bring her cane today but will next session.   EVAL: Patient is a 89 y.o. F who was seen today for physical therapy evaluation and treatment for dx of pelvic fx with ORIF of sacrum on 04/12/24. She has functional trunk mobilty with the exception of R sidebending secondary to L lumabr paraspinal tension. LE strength is [facility] but has room for improvement, she demonstrates limited endurance/ stamina as well as confidence with balance without her RW. Practiced us  NBQC exhibiting and large step length on the L compared bil requiring frequent verbal and tactile cues on proper gait pattern during assessment. She would benefit from physical therapy do promote strength and endurance, maximize stability and safety with gait with LRAD, and maximize her function by addressing the deficits listed.   OBJECTIVE IMPAIRMENTS: Abnormal  gait, decreased activity tolerance, decreased balance, decreased endurance, decreased knowledge of use of DME, decreased ROM, decreased strength, increased fascial restrictions, increased muscle spasms, and pain.   ACTIVITY LIMITATIONS: carrying, lifting, standing, stairs, bathing, and locomotion level  PARTICIPATION LIMITATIONS: meal prep, cleaning, laundry, shopping, and community activity  PERSONAL FACTORS: Age, Past/current experiences, and 1-2 comorbidities: Falls, and osteoporosis are also affecting patient's functional outcome.   REHAB POTENTIAL: Excellent  CLINICAL DECISION MAKING: Evolving/moderate complexity  EVALUATION COMPLEXITY: Moderate   GOALS: Goals reviewed with patient? Yes  SHORT TERM GOALS: Target date: 09/02/2024  Pt will be IND with initial HEP for therapeutic progression Baseline: no prior HEP Goal status: INITIAL  2.  Pt to verbalize and demo efficient gait pattern with LRAD to maximize safety and balance while increasing mobility.  Baseline:  Goal status: INITIAL  3.  Pt to be report pain to </= 2/10 with standing/ walking and sitting on hard surfaces to demo improving condition.  Baseline:  Goal status: INITIAL   LONG TERM GOALS: Target date: 09/30/2024  Pt to increase bil LE strength to to >/= 4+/5 to promote stability with walking/ standing to maximize safety. Baseline:  Goal status: INITIAL  2.  Improve LEFS to >/= 55/80 to demonstrate improvement in function.  Baseline:  Goal status: INITIAL  3.  Reduce tug time to </= 16 sec with LRAD for safety and stability with walking/ standing  Baseline:  Goal status: INITIAL  4.  Pt to be able to navigate obstacles of varying heights and firm and unlevel surfaces with LRAD and report feeling stabile and no fear of falling.  Baseline:  Goal status: INITIAL   5.  PT to be IND with all HEP given and is able to maintain and progress their current LOF IND.  Baseline:  Goal status:  INITIAL    PLAN:  PT FREQUENCY: 1-2x/week  PT DURATION: 12 weeks  PLANNED INTERVENTIONS: 97110-Therapeutic exercises, 97530- Therapeutic activity, [id number]- Neuromuscular re-education, 97535- Self Care, 02859- Manual therapy, [id number]- Gait training, [phone]- Electrical stimulation (unattended), 20560 (1-2 muscles), 20561 (3+ muscles)- Dry Needling, Patient/Family education, Cryotherapy, and Moist heat.  PLAN FOR NEXT SESSION: Review/ update HEP PRN. Gait training with SBQD, gross hip strengthening, static/ dynamic balance.    Stann Ohara PT, DPT, CLT, CES 08/13/24  3:23 PM     Referring diagnosis? Pelvic fracture (HCC) [S32.9XXA]  Treatment diagnosis? (if different than referring diagnosis) Other low back pain M54.59, gen muscle weakness M62.81, Other abnormalities of gait R26.89 What was this (referring dx) caused by? [x]  Surgery [x]  Fall []  Ongoing issue []  Arthritis []  Other: ____________  Laterality: []  Rt []  Lt [x]  Both  Check all possible CPT codes:  *CHOOSE 10 OR LESS*    See Planned Interventions listed in the Plan section of the Evaluation.

## 2024-08-15 ENCOUNTER — Encounter: Payer: Self-pay | Admitting: Physical Therapy

## 2024-08-15 ENCOUNTER — Ambulatory Visit: Admitting: Physical Therapy

## 2024-08-15 DIAGNOSIS — M5459 Other low back pain: Secondary | ICD-10-CM

## 2024-08-15 DIAGNOSIS — M6281 Muscle weakness (generalized): Secondary | ICD-10-CM

## 2024-08-15 DIAGNOSIS — R2689 Other abnormalities of gait and mobility: Secondary | ICD-10-CM

## 2024-08-15 NOTE — Therapy (Signed)
 OUTPATIENT PHYSICAL THERAPY THORACOLUMBAR TREATMENT   Patient Name: Danielle Proctor MRN: [id number] DOB:10/27/1928, 89 y.o., female Today's Date: 08/15/2024  END OF SESSION:  PT End of Session - 08/15/24 1456     Visit Number 3    Number of Visits 17    Date for Recertification  09/30/24    Authorization Type [employer] MCR    PT Start Time 1359    PT Stop Time 1447    PT Time Calculation (min) 48 min    Equipment Utilized During Treatment Gait belt    Activity Tolerance Patient tolerated treatment well    Behavior During Therapy WFL for tasks assessed/performed;Impulsive            Past Medical History:  Diagnosis Date   Atherosclerotic peripheral vascular disease    Diverticulosis    Hypercholesteremia    Hypertension    IBS (irritable bowel syndrome)    Osteoarthritis    Osteoporosis    Thyroid  disease    Past Surgical History:  Procedure Laterality Date   APPENDECTOMY     CHOLECYSTECTOMY     COLON SURGERY     ORIF PELVIC FRACTURE WITH PERCUTANEOUS SCREWS Bilateral 04/12/2024   Procedure: CLOSED REDUCTION, PELVIS, WITH PERCUTANEOUS FIXATION;  Surgeon: Kendal Franky SQUIBB, MD;  Location: [facility] OR;  Service: Orthopedics;  Laterality: Bilateral;   Patient Active Problem List   Diagnosis Date Noted   Fall 04/11/2024   Closed fracture of transverse process of lumbar vertebra (HCC) 04/11/2024   Sacral fracture (HCC) 04/10/2024   Memory loss 03/07/2024   Hypertension 05/23/2022    PCP: Clarice Nottingham, MD  REFERRING PROVIDER: Kendal Franky SQUIBB, MD   REFERRING DIAG: Pelvic fracture (HCC) [D67.9XXA]   Rationale for Evaluation and Treatment: Rehabilitation  THERAPY DIAG:  Other low back pain  Muscle weakness (generalized)  Other abnormalities of gait and mobility  ONSET DATE: DOS 04/12/24  SUBJECTIVE:                                                                                                                                                                                            SUBJECTIVE STATEMENT: Pt states that she has 1-2/10 pain in her L posterior hip and add of RLE. Pt reports compliance with HEP.   Eval: Pt reports she fell backward and landed on her bottom and noted immediate pain and went to the ED she had ORIF of the sacrum on 04/12/24. She had home health PT for a few visits. She reports 4/10 but the pain fluctuates but doesn't limit her. Since the surgery she has noted significant improvement with RW. Prior to this  injury patient was independent with no AD.   PERTINENT HISTORY:  Osteoporosis see PMHx  PAIN:  Are you having pain? Yes: NPRS scale: 1-2/10 Pain location: on the L low back/ sacrum Pain description: little  Aggravating factors: sitting on a firm surface Relieving factors: Tylenol , aspercreme  PRECAUTIONS: Fall  RED FLAGS: None   WEIGHT BEARING RESTRICTIONS: No  FALLS:  Has patient fallen in last 6 months? Yes. Number of falls 1  LIVING ENVIRONMENT: Lives with: lives with an adult companion Lives in: House/apartment Stairs: Yes: External: 3 steps; on right going up Has following equipment at home: Vannie - 2 wheeled and SBQD, shower chair.   OCCUPATION: retired  PLOF: Independent with basic ADLs  PATIENT GOALS: to be able to take a bath   OBJECTIVE:  Note: Objective measures were completed at Evaluation unless otherwise noted.  DIAGNOSTIC FINDINGS:  See chart  PATIENT SURVEYS:  LEFS  Extreme difficulty/unable (0), Quite a bit of difficulty (1), Moderate difficulty (2), Little difficulty (3), No difficulty (4) Survey date:  08/05/24  Any of your usual work, housework or school activities 2  2. Usual hobbies, recreational or sporting activities 1  3. Getting into/out of the bath 0  4. Walking between rooms 3  5. Putting on socks/shoes 4  6. Squatting  3  7. Lifting an object, like a bag of groceries from the floor 1  8. Performing light activities around your home 3  9. Performing heavy  activities around your home 0  10. Getting into/out of a car 3  11. Walking 2 blocks 4  12. Walking 1 mile 0  13. Going up/down 10 stairs (1 flight) 2  14. Standing for 1 hour 0  15.  sitting for 1 hour 4  16. Running on even ground 0  17. Running on uneven ground 0  18. Making sharp turns while running fast 0  19. Hopping  0  20. Rolling over in bed 4  Score total:  34/80     COGNITION: Overall cognitive status: Within functional limits for tasks assessed     SENSATION: Not tested    POSTURE: rounded shoulders and forward head  PALPATION: Standing: Increased resting tension in L paraspinals group. TTP L PSIS. Sitting: TTP PSIS L>R.   LUMBAR ROM:   AROM eval  Flexion 80  Extension   Right lateral flexion 10  Left lateral flexion 30  Right rotation   Left rotation    (Blank rows = not tested)  LOWER EXTREMITY ROM:     Active  Right eval Left eval  Hip flexion    Hip extension    Hip abduction    Hip adduction    Hip internal rotation    Hip external rotation    Knee flexion    Knee extension    Ankle dorsiflexion    Ankle plantarflexion    Ankle inversion    Ankle eversion     (Blank rows = not tested)  LOWER EXTREMITY MMT:    MMT Right eval Left eval  Hip flexion 4+/5 4-/5  Hip extension    Hip abduction 4-/5 3+/5  Hip adduction 5/5 4/5  Hip internal rotation    Hip external rotation    Knee flexion 4+/5 4/5  Knee extension 4/5 4-/5  Ankle dorsiflexion 5/5 5/5  Ankle plantarflexion 5/5 5/5  Ankle inversion    Ankle eversion     (Blank rows = not tested)  LUMBAR SPECIAL TESTS:    FUNCTIONAL  TESTS:  5 times sit to stand: 8.0s from elevated surface, 11s from standard chair height, no HHA. Timed up and go (TUG): 36s with NBQC  GAIT: Distance walked: From Lobby to assessment room Assistive device utilized: [employer] - 2 wheeled Level of assistance: Modified independence with RW and SBA with quad cane Comments:  With RW pt has wide BOS, uses reciprocal pattern with inc EV in stance phase. Uses dec cadence, but maintains upright posture without LOB during reaching or directional changes. Gait speed decreases significantly with use of NBQC in RUE demonstrating dec stride length greater on L vs right, slight forward flexed position following change in AD.    TREATMENT: 08/15/2024  [facility] Adult PT Treatment:                                                DATE: 08/15/24 Therapeutic Exercise: NuStep x4 mins for active warmup level 5 resist without UE use and to promote low grade cyclic loading to BLE. Recommend recumbent bike in future sessions as will likely have improved fitment with LE and seat position.  Supine SKTC 3x15 BLE through pain free ROM with good tolerance and light stretch noted in posterior L hip, near identical ROM on RLE Supine bridge 2x20, 2 sec hold without reproduction of c/c Lateral step downs from foam pad with cane in RUE. Pt completes 3x10 with LLE and repeats on RLE. Good tolerance, CGA fading to SBA with good control and endurance Neuromuscular re-ed: Sit to Stand onto foam pad 3x5 with HHA on armrests prn FT EO on foam pad x30s min sway, no HHA, supervision A FT EC on foam pad 3x30s with SBA progressig from moderate sway and instability initially to min sway in the 3rd set Gait Training: Gait training with NBQC to promote improved reciprocal pattern, step through, and symmetric quality. Cane height adjusted to improve efficiency of position and to improve point of contact with cane closer to feet. Pt able to progress from step to to step through with this change. Overall cadence and confidence improve with practice over 519ft.  Step up and over with foam pad and NBQC x8 laps with CGA, with improving coordination as intervention progresses.      [facility] Adult PT Treatment:                                                DATE: 08/13/24 Therapeutic Exercise: SLR in supine 3x10 BLE  alternating between sets, notes inc pull in L hip flexors.  Supine physioball press x30, 3 sec hold for core strength and stability Seated W stretch with physioball x12 cycles to promote elongation to the lats and paraspinals.  HEP updated and provided  Therapeutic Activity: Pt completes functional activity of stepping into and out of bath-using bar bell as simulated tub edge. Pt has good ability to enter the tub, but has inc apprehension when exiting. In addition to consideration of feeling light headed with positional changes, pt would benefit from holding on tub baths for the time being until improved tolerance to transfers is demonstrated.  Pt completes unilateral knee flexion and extension on physioball for distal motor control and progressive ROM tolerance x30 BLE.  [facility] Adult PT Treatment:                                                DATE: 08/05/24 Provided initial HEP                                                                                                                                  PATIENT EDUCATION:  Education details: evaluation findings, POC, goals, HEP with proper form/ rationale.  Person educated: Patient and Child(ren) Education method: Explanation, Demonstration, Verbal cues, and Handouts Education comprehension: verbalized understanding  HOME EXERCISE PROGRAM: Access Code: [email or web address] URL: [email or web address] Date: 08/13/2024 Prepared by: Stann Ohara  Exercises - Clam  - 4 x weekly - 3 sets - 8 reps - 2 hold - Sit to Stand  - 4 x weekly - 3 sets - 6 reps - Standing March with Counter Support  - 1 x daily - 7 x weekly - 1 sets - 10 reps - 5 hold - Standing 3-way Hip with Walker  - 1 x daily - 7 x weekly - 1 sets - 10 reps - 2 hold  ASSESSMENT:  CLINICAL IMPRESSION: Pt has been responding well to her current regimen. Demonstrates good tolerance to progress today with gait training using NBQC with improving confidence as practice  continues. Pt shows improved stability when BOS and surface stability are challenged. Pt requires cues for sequencing but is motivated to progress and is eager to return to being able to get in and out of her bath safely.   EVAL: Patient is a 89 y.o. F who was seen today for physical therapy evaluation and treatment for dx of pelvic fx with ORIF of sacrum on 04/12/24. She has functional trunk mobilty with the exception of R sidebending secondary to L lumabr paraspinal tension. LE strength is [facility] but has room for improvement, she demonstrates limited endurance/ stamina as well as confidence with balance without her RW. Practiced us  NBQC exhibiting and large step length on the L compared bil requiring frequent verbal and tactile cues on proper gait pattern during assessment. She would benefit from physical therapy do promote strength and endurance, maximize stability and safety with gait with LRAD, and maximize her function by addressing the deficits listed.   OBJECTIVE IMPAIRMENTS: Abnormal gait, decreased activity tolerance, decreased balance, decreased endurance, decreased knowledge of use of DME, decreased ROM, decreased strength, increased fascial restrictions, increased muscle spasms, and pain.   ACTIVITY LIMITATIONS: carrying, lifting, standing, stairs, bathing, and locomotion level  PARTICIPATION LIMITATIONS: meal prep, cleaning, laundry, shopping, and community activity  PERSONAL FACTORS: Age, Past/current experiences, and 1-2 comorbidities: Falls, and osteoporosis are also affecting patient's functional outcome.   REHAB POTENTIAL: Excellent  CLINICAL DECISION MAKING: Evolving/moderate complexity  EVALUATION COMPLEXITY: Moderate   GOALS: Goals reviewed with patient?  Yes  SHORT TERM GOALS: Target date: 09/02/2024  Pt will be IND with initial HEP for therapeutic progression Baseline: no prior HEP Goal status: INITIAL  2.  Pt to verbalize and demo efficient gait pattern with LRAD to  maximize safety and balance while increasing mobility.  Baseline:  Goal status: INITIAL  3.  Pt to be report pain to </= 2/10 with standing/ walking and sitting on hard surfaces to demo improving condition.  Baseline:  Goal status: INITIAL   LONG TERM GOALS: Target date: 09/30/2024  Pt to increase bil LE strength to to >/= 4+/5 to promote stability with walking/ standing to maximize safety. Baseline:  Goal status: INITIAL  2.  Improve LEFS to >/= 55/80 to demonstrate improvement in function.  Baseline:  Goal status: INITIAL  3.  Reduce tug time to </= 16 sec with LRAD for safety and stability with walking/ standing  Baseline:  Goal status: INITIAL  4.  Pt to be able to navigate obstacles of varying heights and firm and unlevel surfaces with LRAD and report feeling stabile and no fear of falling.  Baseline:  Goal status: INITIAL   5.  PT to be IND with all HEP given and is able to maintain and progress their current LOF IND.  Baseline:  Goal status: INITIAL    PLAN:  PT FREQUENCY: 1-2x/week  PT DURATION: 12 weeks  PLANNED INTERVENTIONS: 97110-Therapeutic exercises, 97530- Therapeutic activity, [id number]- Neuromuscular re-education, 97535- Self Care, [id number]- Manual therapy, [id number]- Gait training, [id number]- Electrical stimulation (unattended), 20560 (1-2 muscles), 20561 (3+ muscles)- Dry Needling, Patient/Family education, Cryotherapy, and Moist heat.  PLAN FOR NEXT SESSION: Review/ update HEP PRN. Gait training with SBQC, gross hip strengthening, static/ dynamic balance.    Stann Ohara PT, DPT, CLT, CES 08/15/24  3:00 PM     Referring diagnosis? Pelvic fracture (HCC) [S32.9XXA]  Treatment diagnosis? (if different than referring diagnosis) Other low back pain M54.59, gen muscle weakness M62.81, Other abnormalities of gait R26.89 What was this (referring dx) caused by? [x]  Surgery [x]  Fall []  Ongoing issue []  Arthritis []  Other: ____________  Laterality: []  Rt []   Lt [x]  Both  Check all possible CPT codes:  *CHOOSE 10 OR LESS*    See Planned Interventions listed in the Plan section of the Evaluation.

## 2024-08-20 ENCOUNTER — Encounter: Payer: Self-pay | Admitting: Physical Therapy

## 2024-08-20 ENCOUNTER — Ambulatory Visit: Attending: Student | Admitting: Physical Therapy

## 2024-08-20 DIAGNOSIS — M5459 Other low back pain: Secondary | ICD-10-CM | POA: Insufficient documentation

## 2024-08-20 DIAGNOSIS — M6281 Muscle weakness (generalized): Secondary | ICD-10-CM | POA: Insufficient documentation

## 2024-08-20 DIAGNOSIS — R2689 Other abnormalities of gait and mobility: Secondary | ICD-10-CM | POA: Insufficient documentation

## 2024-08-20 NOTE — Therapy (Addendum)
 OUTPATIENT PHYSICAL THERAPY THORACOLUMBAR TREATMENT   Danielle Name: Danielle Proctor MRN: [id number] DOB:[birth year], 89 y.o., female Today's Date: 08/20/2024  END OF SESSION:  PT End of Session - 08/20/24 1410     Visit Number 4    Number of Visits 17    Date for Recertification  09/30/24    Authorization Type [employer] MCR    Progress Note Due on Visit 10    PT Start Time 1415    PT Stop Time 1505    PT Time Calculation (min) 50 min    Activity Tolerance Danielle tolerated treatment well    Behavior During Therapy [facility] for tasks assessed/performed;Impulsive             Past Medical History:  Diagnosis Date   Atherosclerotic peripheral vascular disease    Diverticulosis    Hypercholesteremia    Hypertension    IBS (irritable bowel syndrome)    Osteoarthritis    Osteoporosis    Thyroid  disease    Past Surgical History:  Procedure Laterality Date   APPENDECTOMY     CHOLECYSTECTOMY     COLON SURGERY     ORIF PELVIC FRACTURE WITH PERCUTANEOUS SCREWS Bilateral 04/12/2024   Procedure: CLOSED REDUCTION, PELVIS, WITH PERCUTANEOUS FIXATION;  Surgeon: Kendal Franky SQUIBB, MD;  Location: [facility] OR;  Service: Orthopedics;  Laterality: Bilateral;   Danielle Active Problem List   Diagnosis Date Noted   Fall 04/11/2024   Closed fracture of transverse process of lumbar vertebra (HCC) 04/11/2024   Sacral fracture (HCC) 04/10/2024   Memory loss 03/07/2024   Hypertension 05/23/2022    PCP: Clarice Nottingham, MD  REFERRING PROVIDER: Kendal Franky SQUIBB, MD   REFERRING DIAG: Pelvic fracture (HCC) [D67.9XXA]   Rationale for Evaluation and Treatment: Rehabilitation  THERAPY DIAG:  No diagnosis found.  ONSET DATE: DOS 04/12/24  SUBJECTIVE:                                                                                                                                                                                           SUBJECTIVE STATEMENT: 08/20/2024  I've been walking more around the  house without the RW.  Eval: Pt reports she fell backward and landed on her bottom and noted immediate pain and went to the ED she had ORIF of the sacrum on 04/12/24. She had home health PT for a few visits. She reports 4/10 but the pain fluctuates but doesn't limit her. Since the surgery she has noted significant improvement with RW. Prior to this injury Danielle was independent with no AD.   PERTINENT HISTORY:  Osteoporosis see PMHx  PAIN:  Are you having  pain? Yes: NPRS scale: 1-2/10 Pain location: on the L low back/ sacrum Pain description: little  Aggravating factors: sitting on a firm surface Relieving factors: Tylenol , aspercreme  PRECAUTIONS: Fall  RED FLAGS: None   WEIGHT BEARING RESTRICTIONS: No  FALLS:  Has Danielle fallen in last 6 months? Yes. Number of falls 1  LIVING ENVIRONMENT: Lives with: lives with an adult companion Lives in: House/apartment Stairs: Yes: External: 3 steps; on right going up Has following equipment at home: Vannie - 2 wheeled and SBQD, shower chair.   OCCUPATION: retired  PLOF: Independent with basic ADLs  Danielle GOALS: to be able to take a bath   OBJECTIVE:  Note: Objective measures were completed at Evaluation unless otherwise noted.  DIAGNOSTIC FINDINGS:  See chart  Danielle SURVEYS:  LEFS  Extreme difficulty/unable (0), Quite a bit of difficulty (1), Moderate difficulty (2), Little difficulty (3), No difficulty (4) Survey date:  08/05/24  Any of your usual work, housework or school activities 2  2. Usual hobbies, recreational or sporting activities 1  3. Getting into/out of the bath 0  4. Walking between rooms 3  5. Putting on socks/shoes 4  6. Squatting  3  7. Lifting an object, like a bag of groceries from the floor 1  8. Performing light activities around your home 3  9. Performing heavy activities around your home 0  10. Getting into/out of a car 3  11. Walking 2 blocks 4  12. Walking 1 mile 0  13. Going up/down 10  stairs (1 flight) 2  14. Standing for 1 hour 0  15.  sitting for 1 hour 4  16. Running on even ground 0  17. Running on uneven ground 0  18. Making sharp turns while running fast 0  19. Hopping  0  20. Rolling over in bed 4  Score total:  34/80     COGNITION: Overall cognitive status: Within functional limits for tasks assessed     SENSATION: Not tested    POSTURE: rounded shoulders and forward head  PALPATION: Standing: Increased resting tension in L paraspinals group. TTP L PSIS. Sitting: TTP PSIS L>R.   LUMBAR ROM:   AROM eval  Flexion 80  Extension   Right lateral flexion 10  Left lateral flexion 30  Right rotation   Left rotation    (Blank rows = not tested)  LOWER EXTREMITY ROM:     Active  Right eval Left eval  Hip flexion    Hip extension    Hip abduction    Hip adduction    Hip internal rotation    Hip external rotation    Knee flexion    Knee extension    Ankle dorsiflexion    Ankle plantarflexion    Ankle inversion    Ankle eversion     (Blank rows = not tested)  LOWER EXTREMITY MMT:    MMT Right eval Left eval  Hip flexion 4+/5 4-/5  Hip extension    Hip abduction 4-/5 3+/5  Hip adduction 5/5 4/5  Hip internal rotation    Hip external rotation    Knee flexion 4+/5 4/5  Knee extension 4/5 4-/5  Ankle dorsiflexion 5/5 5/5  Ankle plantarflexion 5/5 5/5  Ankle inversion    Ankle eversion     (Blank rows = not tested)  LUMBAR SPECIAL TESTS:    FUNCTIONAL TESTS:  5 times sit to stand: 8.0s from elevated surface, 11s from standard chair height, no HHA. Timed up and  go (TUG): 36s with NBQC  GAIT: Distance walked: From Lobby to assessment room Assistive device utilized: [employer] - 2 wheeled Level of assistance: Modified independence with RW and SBA with quad cane Comments: With RW pt has wide BOS, uses reciprocal pattern with inc EV in stance phase. Uses dec cadence, but maintains upright posture without  LOB during reaching or directional changes. Gait speed decreases significantly with use of NBQC in RUE demonstrating dec stride length greater on L vs right, slight forward flexed position following change in AD.    TREATMENT:  [facility] Adult PT Treatment:                                                DATE: 08/20/24 565 ft during 6 min walk qith NBQC Stepping up/ down 4 inch step with NBQC x 10 High knee marching (tactile cues needed) with NBQC Squatting and picking up cones x 10 while holding on to NBQC [place]berg balance standing on airex pad with NBQCwith head turns Gait walking to the car down declined surface (parking lot)  with NBQC Discussed benefit of practicing and using QC at home and RW with any unfamiliar places .   [facility] Adult PT Treatment:                                                DATE: 08/15/24 Therapeutic Exercise: NuStep x4 mins for active warmup level 5 resist without UE use and to promote low grade cyclic loading to BLE. Recommend recumbent bike in future sessions as will likely have improved fitment with LE and seat position.  Supine SKTC 3x15 BLE through pain free ROM with good tolerance and light stretch noted in posterior L hip, near identical ROM on RLE Supine bridge 2x20, 2 sec hold without reproduction of c/c Lateral step downs from foam pad with cane in RUE. Pt completes 3x10 with LLE and repeats on RLE. Good tolerance, CGA fading to SBA with good control and endurance Neuromuscular re-ed: Sit to Stand onto foam pad 3x5 with HHA on armrests prn FT EO on foam pad x30s min sway, no HHA, supervision A FT EC on foam pad 3x30s with SBA progressig from moderate sway and instability initially to min sway in the 3rd set Gait Training: Gait training with NBQC to promote improved reciprocal pattern, step through, and symmetric quality. Cane height adjusted to improve efficiency of position and to improve point of contact with cane closer to feet. Pt able to progress from step  to to step through with this change. Overall cadence and confidence improve with practice over 539ft.  Step up and over with foam pad and NBQC x8 laps with CGA, with improving coordination as intervention progresses.    [facility] Adult PT Treatment:                                                DATE: 08/13/24 Therapeutic Exercise: SLR in supine 3x10 BLE alternating between sets, notes inc pull in L hip flexors.  Supine physioball press x30, 3 sec hold for core  strength and stability Seated W stretch with physioball x12 cycles to promote elongation to the lats and paraspinals.  HEP updated and provided  Therapeutic Activity: Pt completes functional activity of stepping into and out of bath-using bar bell as simulated tub edge. Pt has good ability to enter the tub, but has inc apprehension when exiting. In addition to consideration of feeling light headed with positional changes, pt would benefit from holding on tub baths for the time being until improved tolerance to transfers is demonstrated.  Pt completes unilateral knee flexion and extension on physioball for distal motor control and progressive ROM tolerance x30 BLE.     [facility] Adult PT Treatment:                                                DATE: 08/05/24 Provided initial HEP                                                                                                                                  Danielle EDUCATION:  Education details: evaluation findings, POC, goals, HEP with proper form/ rationale.  Person educated: Danielle and Child(ren) Education method: Explanation, Demonstration, Verbal cues, and Handouts Education comprehension: verbalized understanding  HOME EXERCISE PROGRAM: Access Code: [email or web address] URL: [email or web address] Date: 08/13/2024 Prepared by: Stann Ohara  Exercises - Clam  - 4 x weekly - 3 sets - 8 reps - 2 hold - Sit to Stand  - 4 x weekly - 3 sets - 6 reps - Standing March with Counter  Support  - 1 x daily - 7 x weekly - 1 sets - 10 reps - 5 hold - Standing 3-way Hip with Walker  - 1 x daily - 7 x weekly - 1 sets - 10 reps - 2 hold  ASSESSMENT:  CLINICAL IMPRESSION: 08/20/2024 Danielle Proctor arrives to session noting she has been working walking more without her RW. Focused session on gait training and balance training using NBQC she did very well requiring min verbal cues for cane placement. Discussed using NBQC in the house and keep it with her at all times, and reviewed potential tripping hazards to maximize safety. She noted some soreness during session likely related to fatigue, but end of session noted no increase in pain.   EVAL: Danielle is a 89 y.o. F who was seen today for physical therapy evaluation and treatment for dx of pelvic fx with ORIF of sacrum on 04/12/24. She has functional trunk mobilty with the exception of R sidebending secondary to L lumabr paraspinal tension. LE strength is [facility] but has room for improvement, she demonstrates limited endurance/ stamina as well as confidence with balance without her RW. Practiced us  NBQC exhibiting and large step length on the L compared bil requiring frequent verbal and tactile cues on proper gait  pattern during assessment. She would benefit from physical therapy do promote strength and endurance, maximize stability and safety with gait with LRAD, and maximize her function by addressing the deficits listed.   OBJECTIVE IMPAIRMENTS: Abnormal gait, decreased activity tolerance, decreased balance, decreased endurance, decreased knowledge of use of DME, decreased ROM, decreased strength, increased fascial restrictions, increased muscle spasms, and pain.   ACTIVITY LIMITATIONS: carrying, lifting, standing, stairs, bathing, and locomotion level  PARTICIPATION LIMITATIONS: meal prep, cleaning, laundry, shopping, and community activity  PERSONAL FACTORS: Age, Past/current experiences, and 1-2 comorbidities: Falls, and osteoporosis are  also affecting Danielle's functional outcome.   REHAB POTENTIAL: Excellent  CLINICAL DECISION MAKING: Evolving/moderate complexity  EVALUATION COMPLEXITY: Moderate   GOALS: Goals reviewed with Danielle? Yes  SHORT TERM GOALS: Target date: 09/02/2024  Pt will be IND with initial HEP for therapeutic progression Baseline: no prior HEP Goal status: INITIAL  2.  Pt to verbalize and demo efficient gait pattern with LRAD to maximize safety and balance while increasing mobility.  Baseline:  Goal status: INITIAL  3.  Pt to be report pain to </= 2/10 with standing/ walking and sitting on hard surfaces to demo improving condition.  Baseline:  Goal status: INITIAL   LONG TERM GOALS: Target date: 09/30/2024  Pt to increase bil LE strength to to >/= 4+/5 to promote stability with walking/ standing to maximize safety. Baseline:  Goal status: INITIAL  2.  Improve LEFS to >/= 55/80 to demonstrate improvement in function.  Baseline:  Goal status: INITIAL  3.  Reduce tug time to </= 16 sec with LRAD for safety and stability with walking/ standing  Baseline:  Goal status: INITIAL  4.  Pt to be able to navigate obstacles of varying heights and firm and unlevel surfaces with LRAD and report feeling stabile and no fear of falling.  Baseline:  Goal status: INITIAL   5.  PT to be IND with all HEP given and is able to maintain and progress their current LOF IND.  Baseline:  Goal status: INITIAL    PLAN:  PT FREQUENCY: 1-2x/week  PT DURATION: 12 weeks  PLANNED INTERVENTIONS: 97110-Therapeutic exercises, 97530- Therapeutic activity, [id number]- Neuromuscular re-education, 97535- Self Care, [id number]- Manual therapy, [id number]- Gait training, [phone]- Electrical stimulation (unattended), [id number] (1-2 muscles), [id number] (3+ muscles)- Dry Needling, Danielle/Family education, Cryotherapy, and Moist heat.  PLAN FOR NEXT SESSION: Review/ update HEP PRN. Gait training with SBQC, gross hip strengthening, static/  dynamic balance.    Asra Gambrel PT, DPT, LAT, ATC  08/20/24  3:10 PM

## 2024-08-22 ENCOUNTER — Ambulatory Visit: Admitting: Physical Therapy

## 2024-08-22 ENCOUNTER — Encounter: Payer: Self-pay | Admitting: Physical Therapy

## 2024-08-22 DIAGNOSIS — M5459 Other low back pain: Secondary | ICD-10-CM | POA: Diagnosis not present

## 2024-08-22 DIAGNOSIS — R2689 Other abnormalities of gait and mobility: Secondary | ICD-10-CM | POA: Diagnosis not present

## 2024-08-22 DIAGNOSIS — M6281 Muscle weakness (generalized): Secondary | ICD-10-CM

## 2024-08-22 NOTE — Therapy (Signed)
 OUTPATIENT PHYSICAL THERAPY THORACOLUMBAR TREATMENT   Patient Name: Danielle Proctor MRN: [id number] DOB:[birth year], 89 y.o., female Today's Date: 08/22/2024  END OF SESSION:  PT End of Session - 08/22/24 1423     Visit Number 5    Number of Visits 17    Date for Recertification  09/30/24    Authorization Type [employer] M[employer]    Authorization Time Period 08/05/24 - 11/03/2024    Authorization - Visit Number 4    Authorization - Number of Visits 10    Progress Note Due on Visit 10    PT Start Time 1419    Activity Tolerance Patient tolerated treatment well    Behavior During Therapy [place] for tasks assessed/performed;Impulsive              Past Medical History:  Diagnosis Date   Atherosclerotic peripheral vascular disease    Diverticulosis    Hypercholesteremia    Hypertension    IBS (irritable bowel syndrome)    Osteoarthritis    Osteoporosis    Thyroid  disease    Past Surgical History:  Procedure Laterality Date   APPENDECTOMY     CHOLECYSTECTOMY     COLON SURGERY     ORIF PELVIC FRACTURE WITH PERCUTANEOUS SCREWS Bilateral 04/12/2024   Procedure: CLOSED REDUCTION, PELVIS, WITH PERCUTANEOUS FIXATION;  Surgeon: Kendal Franky SQUIBB, MD;  Location: [facility] OR;  Service: Orthopedics;  Laterality: Bilateral;   Patient Active Problem List   Diagnosis Date Noted   Fall 04/11/2024   Closed fracture of transverse process of lumbar vertebra (HCC) 04/11/2024   Sacral fracture (HCC) 04/10/2024   Memory loss 03/07/2024   Hypertension 05/23/2022    PCP: Clarice Nottingham, MD  REFERRING PROVIDER: Kendal Franky SQUIBB, MD   REFERRING DIAG: Pelvic fracture (HCC) [D67.9XXA]   Rationale for Evaluation and Treatment: Rehabilitation  THERAPY DIAG:  No diagnosis found.  ONSET DATE: DOS 04/12/24  SUBJECTIVE:                                                                                                                                                                                            SUBJECTIVE STATEMENT: 08/22/2024 been doing my exercies and walking with the cane at home and the RW when I am outside.  Eval: Pt reports she fell backward and landed on her bottom and noted immediate pain and went to the ED she had ORIF of the sacrum on 04/12/24. She had home health PT for a few visits. She reports 4/10 but the pain fluctuates but doesn't limit her. Since the surgery she has noted significant improvement with RW. Prior to this injury patient was  independent with no AD.   PERTINENT HISTORY:  Osteoporosis see PMHx  PAIN:  Are you having pain? Yes: NPRS scale: 1-2/10 Pain location: on the L low back/ sacrum Pain description: little  Aggravating factors: sitting on a firm surface Relieving factors: Tylenol , aspercreme  PRECAUTIONS: Fall  RED FLAGS: None   WEIGHT BEARING RESTRICTIONS: No  FALLS:  Has patient fallen in last 6 months? Yes. Number of falls 1  LIVING ENVIRONMENT: Lives with: lives with an adult companion Lives in: House/apartment Stairs: Yes: External: 3 steps; on right going up Has following equipment at home: Vannie - 2 wheeled and SBQD, shower chair.   OCCUPATION: retired  PLOF: Independent with basic ADLs  PATIENT GOALS: to be able to take a bath   OBJECTIVE:  Note: Objective measures were completed at Evaluation unless otherwise noted.  DIAGNOSTIC FINDINGS:  See chart  PATIENT SURVEYS:  LEFS  Extreme difficulty/unable (0), Quite a bit of difficulty (1), Moderate difficulty (2), Little difficulty (3), No difficulty (4) Survey date:  08/05/24  Any of your usual work, housework or school activities 2  2. Usual hobbies, recreational or sporting activities 1  3. Getting into/out of the bath 0  4. Walking between rooms 3  5. Putting on socks/shoes 4  6. Squatting  3  7. Lifting an object, like a bag of groceries from the floor 1  8. Performing light activities around your home 3  9. Performing heavy activities around your home 0   10. Getting into/out of a car 3  11. Walking 2 blocks 4  12. Walking 1 mile 0  13. Going up/down 10 stairs (1 flight) 2  14. Standing for 1 hour 0  15.  sitting for 1 hour 4  16. Running on even ground 0  17. Running on uneven ground 0  18. Making sharp turns while running fast 0  19. Hopping  0  20. Rolling over in bed 4  Score total:  34/80     COGNITION: Overall cognitive status: Within functional limits for tasks assessed     SENSATION: Not tested    POSTURE: rounded shoulders and forward head  PALPATION: Standing: Increased resting tension in L paraspinals group. TTP L PSIS. Sitting: TTP PSIS L>R.   LUMBAR ROM:   AROM eval  Flexion 80  Extension   Right lateral flexion 10  Left lateral flexion 30  Right rotation   Left rotation    (Blank rows = not tested)  LOWER EXTREMITY ROM:     Active  Right eval Left eval  Hip flexion    Hip extension    Hip abduction    Hip adduction    Hip internal rotation    Hip external rotation    Knee flexion    Knee extension    Ankle dorsiflexion    Ankle plantarflexion    Ankle inversion    Ankle eversion     (Blank rows = not tested)  LOWER EXTREMITY MMT:    MMT Right eval Left eval  Hip flexion 4+/5 4-/5  Hip extension    Hip abduction 4-/5 3+/5  Hip adduction 5/5 4/5  Hip internal rotation    Hip external rotation    Knee flexion 4+/5 4/5  Knee extension 4/5 4-/5  Ankle dorsiflexion 5/5 5/5  Ankle plantarflexion 5/5 5/5  Ankle inversion    Ankle eversion     (Blank rows = not tested)  LUMBAR SPECIAL TESTS:    FUNCTIONAL TESTS:  5  times sit to stand: 8.0s from elevated surface, 11s from standard chair height, no HHA. Timed up and go (TUG): 36s with NBQC  GAIT: Distance walked: From Lobby to assessment room Assistive device utilized: [employer] - 2 wheeled Level of assistance: Modified independence with RW and SBA with quad cane Comments: With RW pt has wide BOS, uses  reciprocal pattern with inc EV in stance phase. Uses dec cadence, but maintains upright posture without LOB during reaching or directional changes. Gait speed decreases significantly with use of NBQC in RUE demonstrating dec stride length greater on L vs right, slight forward flexed position following change in AD.    TREATMENT:  [facility] Adult PT Treatment:                                                DATE: 08/22/24 Nu-step LE only L5 x 5 min  Obstacle course starting on airex pad and stepping over 5 varying height obstacles in // Standing rhomberg balance on airex eyes closed in //  Marching in place on airex in // holding on to NBQC Sit to stand 3 x 10 , 1 set with 5# KB, 2 set with 10# KB Standing hip abduction 2x 10 bil with YTB Seated marching with YTB 2 x 10 Reviewed and updated HEp and provided YTB  [facility] Adult PT Treatment:                                                DATE: 08/20/24 565 ft during 6 min walk qith NBQC Stepping up/ down 4 inch step with NBQC x 10 High knee marching (tactile cues needed) with NBQC Squatting and picking up cones x 10 while holding on to NBQC Rhomberg balance standing on airex pad with NBQCwith head turns Gait walking to the car down declined surface (parking lot)  with NBQC Discussed benefit of practicing and using QC at home and RW with any unfamiliar places .   [facility] Adult PT Treatment:                                                DATE: 08/15/24 Therapeutic Exercise: NuStep x4 mins for active warmup level 5 resist without UE use and to promote low grade cyclic loading to BLE. Recommend recumbent bike in future sessions as will likely have improved fitment with LE and seat position.  Supine SKTC 3x15 BLE through pain free ROM with good tolerance and light stretch noted in posterior L hip, near identical ROM on RLE Supine bridge 2x20, 2 sec hold without reproduction of c/c Lateral step downs from foam pad with cane in RUE. Pt completes 3x10 with LLE  and repeats on RLE. Good tolerance, CGA fading to SBA with good control and endurance Neuromuscular re-ed: Sit to Stand onto foam pad 3x5 with HHA on armrests prn FT EO on foam pad x30s min sway, no HHA, supervision A FT EC on foam pad 3x30s with SBA progressig from moderate sway and instability initially to min sway in the 3rd set Gait Training: Gait training with  NBQC to promote improved reciprocal pattern, step through, and symmetric quality. Cane height adjusted to improve efficiency of position and to improve point of contact with cane closer to feet. Pt able to progress from step to to step through with this change. Overall cadence and confidence improve with practice over 570ft.  Step up and over with foam pad and NBQC x8 laps with CGA, with improving coordination as intervention progresses.    [facility] Adult PT Treatment:                                                DATE: 08/13/24 Therapeutic Exercise: SLR in supine 3x10 BLE alternating between sets, notes inc pull in L hip flexors.  Supine physioball press x30, 3 sec hold for core strength and stability Seated W stretch with physioball x12 cycles to promote elongation to the lats and paraspinals.  HEP updated and provided  Therapeutic Activity: Pt completes functional activity of stepping into and out of bath-using bar bell as simulated tub edge. Pt has good ability to enter the tub, but has inc apprehension when exiting. In addition to consideration of feeling light headed with positional changes, pt would benefit from holding on tub baths for the time being until improved tolerance to transfers is demonstrated.  Pt completes unilateral knee flexion and extension on physioball for distal motor control and progressive ROM tolerance x30 BLE.   [facility] Adult PT Treatment:                                                DATE: 08/05/24 Provided initial HEP                                                                                                                                 PATIENT EDUCATION:  Education details: evaluation findings, POC, goals, HEP with proper form/ rationale.  Person educated: Patient and Child(ren) Education method: Explanation, Demonstration, Verbal cues, and Handouts Education comprehension: verbalized understanding  HOME EXERCISE PROGRAM: Access Code: [email or web address] URL: [email or web address] Date: 08/22/2024 Prepared by: Joneen Fresh  Exercises - Clam  - 4 x weekly - 3 sets - 8 reps - 2 hold - Sit to Stand  - 7 x weekly - 3 sets - 10 reps - Standing March with Counter Support  - 1 x daily - 7 x weekly - 1 sets - 10 reps - 5 hold - Standing Hip Abduction with Resistance at Ankles and Counter Support  - 1 x daily - 7 x weekly - 2 sets - 10 reps - Seated March with Resistance  - 1 x daily - 7 x weekly - 2 sets - 10 reps  ASSESSMENT:  CLINICAL IMPRESSION: 08/22/2024 Mrs Treloar arrives to session noting she has been using her NBQC at home and RW when she is out of the house. Cotninued working on gait training over/ on varying obstacles and balance training with pertubations. Continued progression of strengthening which she did quite well noting minimal fleeting soreness in the front of the hip. Updated HEP today for progression of strengthening.   EVAL: Patient is a 89 y.o. F who was seen today for physical therapy evaluation and treatment for dx of pelvic fx with ORIF of sacrum on 04/12/24. She has functional trunk mobilty with the exception of R sidebending secondary to L lumabr paraspinal tension. LE strength is [facility] but has room for improvement, she demonstrates limited endurance/ stamina as well as confidence with balance without her RW. Practiced us  NBQC exhibiting and large step length on the L compared bil requiring frequent verbal and tactile cues on proper gait pattern during assessment. She would benefit from physical therapy do promote strength and endurance, maximize stability and  safety with gait with LRAD, and maximize her function by addressing the deficits listed.   OBJECTIVE IMPAIRMENTS: Abnormal gait, decreased activity tolerance, decreased balance, decreased endurance, decreased knowledge of use of DME, decreased ROM, decreased strength, increased fascial restrictions, increased muscle spasms, and pain.   ACTIVITY LIMITATIONS: carrying, lifting, standing, stairs, bathing, and locomotion level  PARTICIPATION LIMITATIONS: meal prep, cleaning, laundry, shopping, and community activity  PERSONAL FACTORS: Age, Past/current experiences, and 1-2 comorbidities: Falls, and osteoporosis are also affecting patient's functional outcome.   REHAB POTENTIAL: Excellent  CLINICAL DECISION MAKING: Evolving/moderate complexity  EVALUATION COMPLEXITY: Moderate   GOALS: Goals reviewed with patient? Yes  SHORT TERM GOALS: Target date: 09/02/2024  Pt will be IND with initial HEP for therapeutic progression Baseline: no prior HEP Goal status: INITIAL  2.  Pt to verbalize and demo efficient gait pattern with LRAD to maximize safety and balance while increasing mobility.  Baseline:  Goal status: INITIAL  3.  Pt to be report pain to </= 2/10 with standing/ walking and sitting on hard surfaces to demo improving condition.  Baseline:  Goal status: INITIAL   LONG TERM GOALS: Target date: 09/30/2024  Pt to increase bil LE strength to to >/= 4+/5 to promote stability with walking/ standing to maximize safety. Baseline:  Goal status: INITIAL  2.  Improve LEFS to >/= 55/80 to demonstrate improvement in function.  Baseline:  Goal status: INITIAL  3.  Reduce tug time to </= 16 sec with LRAD for safety and stability with walking/ standing  Baseline:  Goal status: INITIAL  4.  Pt to be able to navigate obstacles of varying heights and firm and unlevel surfaces with LRAD and report feeling stabile and no fear of falling.  Baseline:  Goal status: INITIAL   5.  PT to be  IND with all HEP given and is able to maintain and progress their current LOF IND.  Baseline:  Goal status: INITIAL    PLAN:  PT FREQUENCY: 1-2x/week  PT DURATION: 12 weeks  PLANNED INTERVENTIONS: 97110-Therapeutic exercises, 97530- Therapeutic activity, [id number]- Neuromuscular re-education, 97535- Self Care, [id number]- Manual therapy, [id number]- Gait training, [phone]- Electrical stimulation (unattended), 20560 (1-2 muscles), 20561 (3+ muscles)- Dry Needling, Patient/Family education, Cryotherapy, and Moist heat.  PLAN FOR NEXT SESSION: Review/ update HEP PRN. Gait training with SBQC, gross hip strengthening, static/ dynamic balance.    Hanifah Royse PT, DPT, LAT, ATC  08/22/24  2:24 PM

## 2024-08-27 ENCOUNTER — Ambulatory Visit: Admitting: Physical Therapy

## 2024-08-27 ENCOUNTER — Encounter: Payer: Self-pay | Admitting: Physical Therapy

## 2024-08-27 DIAGNOSIS — M6281 Muscle weakness (generalized): Secondary | ICD-10-CM | POA: Diagnosis not present

## 2024-08-27 DIAGNOSIS — M5459 Other low back pain: Secondary | ICD-10-CM | POA: Diagnosis not present

## 2024-08-27 DIAGNOSIS — R2689 Other abnormalities of gait and mobility: Secondary | ICD-10-CM | POA: Diagnosis not present

## 2024-08-27 NOTE — Therapy (Signed)
 OUTPATIENT PHYSICAL THERAPY THORACOLUMBAR TREATMENT   Patient Name: Danielle Proctor MRN: [id number] DOB:10-24-1928, 89 y.o., female Today's Date: 08/27/2024  END OF SESSION:  PT End of Session - 08/27/24 1351     Visit Number 6    Number of Visits 17    Date for Recertification  09/30/24    Authorization Type [employer] M[employer]    Authorization Time Period 08/05/24 - 11/03/2024    Authorization - Number of Visits 10    Progress Note Due on Visit 10    PT Start Time 1355    PT Stop Time 1442    PT Time Calculation (min) 47 min    Equipment Utilized During Treatment Gait belt    Activity Tolerance Patient tolerated treatment well    Behavior During Therapy WFL for tasks assessed/performed;Impulsive               Past Medical History:  Diagnosis Date   Atherosclerotic peripheral vascular disease    Diverticulosis    Hypercholesteremia    Hypertension    IBS (irritable bowel syndrome)    Osteoarthritis    Osteoporosis    Thyroid  disease    Past Surgical History:  Procedure Laterality Date   APPENDECTOMY     CHOLECYSTECTOMY     COLON SURGERY     ORIF PELVIC FRACTURE WITH PERCUTANEOUS SCREWS Bilateral 04/12/2024   Procedure: CLOSED REDUCTION, PELVIS, WITH PERCUTANEOUS FIXATION;  Surgeon: Kendal Franky SQUIBB, MD;  Location: [facility] OR;  Service: Orthopedics;  Laterality: Bilateral;   Patient Active Problem List   Diagnosis Date Noted   Fall 04/11/2024   Closed fracture of transverse process of lumbar vertebra (HCC) 04/11/2024   Sacral fracture (HCC) 04/10/2024   Memory loss 03/07/2024   Hypertension 05/23/2022    PCP: Clarice Nottingham, MD  REFERRING PROVIDER: Kendal Franky SQUIBB, MD   REFERRING DIAG: Pelvic fracture (HCC) [D67.9XXA]   Rationale for Evaluation and Treatment: Rehabilitation  THERAPY DIAG:  Other low back pain  Muscle weakness (generalized)  Other abnormalities of gait and mobility  ONSET DATE: DOS 04/12/24  SUBJECTIVE:                                                                                                                                                                                            SUBJECTIVE STATEMENT: 08/27/2024 Pt states that she has increased achiness in her back and hips with the recent change in weather. Pt reports compliance with HEP. Has continued to use NBQC at home and uses walker when in the community.   Eval: Pt reports she fell backward and landed on her bottom and noted immediate  pain and went to the ED she had ORIF of the sacrum on 04/12/24. She had home health PT for a few visits. She reports 4/10 but the pain fluctuates but doesn't limit her. Since the surgery she has noted significant improvement with RW. Prior to this injury patient was independent with no AD.   PERTINENT HISTORY:  Osteoporosis see PMHx  PAIN:  Are you having pain? Yes: NPRS scale: 1-2/10 Pain location: on the L low back/ sacrum Pain description: little  Aggravating factors: sitting on a firm surface Relieving factors: Tylenol , aspercreme  PRECAUTIONS: Fall  RED FLAGS: None   WEIGHT BEARING RESTRICTIONS: No  FALLS:  Has patient fallen in last 6 months? Yes. Number of falls 1  LIVING ENVIRONMENT: Lives with: lives with an adult companion Lives in: House/apartment Stairs: Yes: External: 3 steps; on right going up Has following equipment at home: Vannie - 2 wheeled and SBQD, shower chair.   OCCUPATION: retired  PLOF: Independent with basic ADLs  PATIENT GOALS: to be able to take a bath   OBJECTIVE:  Note: Objective measures were completed at Evaluation unless otherwise noted.  DIAGNOSTIC FINDINGS:  See chart  PATIENT SURVEYS:  LEFS  Extreme difficulty/unable (0), Quite a bit of difficulty (1), Moderate difficulty (2), Little difficulty (3), No difficulty (4) Survey date:  08/05/24  Any of your usual work, housework or school activities 2  2. Usual hobbies, recreational or sporting activities 1  3. Getting  into/out of the bath 0  4. Walking between rooms 3  5. Putting on socks/shoes 4  6. Squatting  3  7. Lifting an object, like a bag of groceries from the floor 1  8. Performing light activities around your home 3  9. Performing heavy activities around your home 0  10. Getting into/out of a car 3  11. Walking 2 blocks 4  12. Walking 1 mile 0  13. Going up/down 10 stairs (1 flight) 2  14. Standing for 1 hour 0  15.  sitting for 1 hour 4  16. Running on even ground 0  17. Running on uneven ground 0  18. Making sharp turns while running fast 0  19. Hopping  0  20. Rolling over in bed 4  Score total:  34/80     COGNITION: Overall cognitive status: Within functional limits for tasks assessed     SENSATION: Not tested    POSTURE: rounded shoulders and forward head  PALPATION: Standing: Increased resting tension in L paraspinals group. TTP L PSIS. Sitting: TTP PSIS L>R.   LUMBAR ROM:   AROM eval  Flexion 80  Extension   Right lateral flexion 10  Left lateral flexion 30  Right rotation   Left rotation    (Blank rows = not tested)  LOWER EXTREMITY ROM:     Active  Right eval Left eval  Hip flexion    Hip extension    Hip abduction    Hip adduction    Hip internal rotation    Hip external rotation    Knee flexion    Knee extension    Ankle dorsiflexion    Ankle plantarflexion    Ankle inversion    Ankle eversion     (Blank rows = not tested)  LOWER EXTREMITY MMT:    MMT Right eval Left eval  Hip flexion 4+/5 4-/5  Hip extension    Hip abduction 4-/5 3+/5  Hip adduction 5/5 4/5  Hip internal rotation    Hip external rotation  Knee flexion 4+/5 4/5  Knee extension 4/5 4-/5  Ankle dorsiflexion 5/5 5/5  Ankle plantarflexion 5/5 5/5  Ankle inversion    Ankle eversion     (Blank rows = not tested)  LUMBAR SPECIAL TESTS:    FUNCTIONAL TESTS:  5 times sit to stand: 8.0s from elevated surface, 11s from standard chair height, no HHA. Timed up and  go (TUG): 36s with NBQC  GAIT: Distance walked: From Lobby to assessment room Assistive device utilized: [employer] - 2 wheeled Level of assistance: Modified independence with RW and SBA with quad cane Comments: With RW pt has wide BOS, uses reciprocal pattern with inc EV in stance phase. Uses dec cadence, but maintains upright posture without LOB during reaching or directional changes. Gait speed decreases significantly with use of NBQC in RUE demonstrating dec stride length greater on L vs right, slight forward flexed position following change in AD.    TREATMENT:   [facility] Adult PT Treatment:                                                DATE: 08/27/24 Therapeutic Exercise: Recumbent bike x6 mins level 2 resistance Supine marching with green resistance band 3x10 BLE alternating between reps Supine bridging with bilateral march 2x10  Step ups on 8 inch step height x15 RLE lead x15 LLE lead Pt progresses to step up and over on 8 inch step x10 RLE lead and x10 LLE lead, CGA.  Gait Training: Adjusted NBQC for efficient UE use-pt states that someone at home adjusted for her. Pt able to demonstrate reciprocal pattern with antalgic pattern evident using cane in RUE, near symmetric with LLE WB having inc shift to L. Height adjusted one more time (up one) for pt preference. Able to complete small space navigation and obstacle negotiation on level surfaces.   [facility] Adult PT Treatment:                                                DATE: 08/22/24 Nu-step LE only L5 x 5 min  Obstacle course starting on airex pad and stepping over 5 varying height obstacles in // Standing rhomberg balance on airex eyes closed in //  Marching in place on airex in // holding on to NBQC Sit to stand 3 x 10 , 1 set with 5# KB, 2 set with 10# KB Standing hip abduction 2x 10 bil with YTB Seated marching with YTB 2 x 10 Reviewed and updated HEp and provided YTB  [facility] Adult PT Treatment:                                                 DATE: 08/20/24 565 ft during 6 min walk qith NBQC Stepping up/ down 4 inch step with NBQC x 10 High knee marching (tactile cues needed) with NBQC Squatting and picking up cones x 10 while holding on to NBQC Rhomberg balance standing on airex pad with NBQCwith head turns Gait walking to the car down declined surface (parking lot)  with NBQC Discussed benefit of practicing  and using QC at home and RW with any unfamiliar places .   [facility] Adult PT Treatment:                                                DATE: 08/15/24 Therapeutic Exercise: NuStep x4 mins for active warmup level 5 resist without UE use and to promote low grade cyclic loading to BLE. Recommend recumbent bike in future sessions as will likely have improved fitment with LE and seat position.  Supine SKTC 3x15 BLE through pain free ROM with good tolerance and light stretch noted in posterior L hip, near identical ROM on RLE Supine bridge 2x20, 2 sec hold without reproduction of c/c Lateral step downs from foam pad with cane in RUE. Pt completes 3x10 with LLE and repeats on RLE. Good tolerance, CGA fading to SBA with good control and endurance Neuromuscular re-ed: Sit to Stand onto foam pad 3x5 with HHA on armrests prn FT EO on foam pad x30s min sway, no HHA, supervision A FT EC on foam pad 3x30s with SBA progressig from moderate sway and instability initially to min sway in the 3rd set Gait Training: Gait training with NBQC to promote improved reciprocal pattern, step through, and symmetric quality. Cane height adjusted to improve efficiency of position and to improve point of contact with cane closer to feet. Pt able to progress from step to to step through with this change. Overall cadence and confidence improve with practice over 586ft.  Step up and over with foam pad and NBQC x8 laps with CGA, with improving coordination as intervention progresses.                                                                                       PATIENT EDUCATION:  Education details: evaluation findings, POC, goals, HEP with proper form/ rationale.  Person educated: Patient and Child(ren) Education method: Explanation, Demonstration, Verbal cues, and Handouts Education comprehension: verbalized understanding  HOME EXERCISE PROGRAM: Access Code: [email or web address] URL: [email or web address] Date: 08/22/2024 Prepared by: Joneen Fresh  Exercises - Clam  - 4 x weekly - 3 sets - 8 reps - 2 hold - Sit to Stand  - 7 x weekly - 3 sets - 10 reps - Standing March with Counter Support  - 1 x daily - 7 x weekly - 1 sets - 10 reps - 5 hold - Standing Hip Abduction with Resistance at Ankles and Counter Support  - 1 x daily - 7 x weekly - 2 sets - 10 reps - Seated March with Resistance  - 1 x daily - 7 x weekly - 2 sets - 10 reps  ASSESSMENT:  CLINICAL IMPRESSION: 08/27/2024 Pt has been responding well to her current regimen, demonstrates improved tolerance to elevation changes and obstacle negotiation today. Strengthening well tolerated through functional movement patterns. Pt remains limited in balance when stepping over objects and requires CGA and HHA for support.   EVAL: Patient is a 89 y.o. F who was seen today for physical therapy evaluation and treatment  for dx of pelvic fx with ORIF of sacrum on 04/12/24. She has functional trunk mobilty with the exception of R sidebending secondary to L lumabr paraspinal tension. LE strength is [facility] but has room for improvement, she demonstrates limited endurance/ stamina as well as confidence with balance without her RW. Practiced us  NBQC exhibiting and large step length on the L compared bil requiring frequent verbal and tactile cues on proper gait pattern during assessment. She would benefit from physical therapy do promote strength and endurance, maximize stability and safety with gait with LRAD, and maximize her function by addressing the deficits listed.    OBJECTIVE IMPAIRMENTS: Abnormal gait, decreased activity tolerance, decreased balance, decreased endurance, decreased knowledge of use of DME, decreased ROM, decreased strength, increased fascial restrictions, increased muscle spasms, and pain.   ACTIVITY LIMITATIONS: carrying, lifting, standing, stairs, bathing, and locomotion level  PARTICIPATION LIMITATIONS: meal prep, cleaning, laundry, shopping, and community activity  PERSONAL FACTORS: Age, Past/current experiences, and 1-2 comorbidities: Falls, and osteoporosis are also affecting patient's functional outcome.   REHAB POTENTIAL: Excellent  CLINICAL DECISION MAKING: Evolving/moderate complexity  EVALUATION COMPLEXITY: Moderate   GOALS: Goals reviewed with patient? Yes  SHORT TERM GOALS: Target date: 09/02/2024  Pt will be IND with initial HEP for therapeutic progression Baseline: no prior HEP Goal status: INITIAL  2.  Pt to verbalize and demo efficient gait pattern with LRAD to maximize safety and balance while increasing mobility.  Baseline:  Goal status: INITIAL  3.  Pt to be report pain to </= 2/10 with standing/ walking and sitting on hard surfaces to demo improving condition.  Baseline:  Goal status: INITIAL   LONG TERM GOALS: Target date: 09/30/2024  Pt to increase bil LE strength to to >/= 4+/5 to promote stability with walking/ standing to maximize safety. Baseline:  Goal status: INITIAL  2.  Improve LEFS to >/= 55/80 to demonstrate improvement in function.  Baseline:  Goal status: INITIAL  3.  Reduce tug time to </= 16 sec with LRAD for safety and stability with walking/ standing  Baseline:  Goal status: INITIAL  4.  Pt to be able to navigate obstacles of varying heights and firm and unlevel surfaces with LRAD and report feeling stabile and no fear of falling.  Baseline:  Goal status: INITIAL   5.  PT to be IND with all HEP given and is able to maintain and progress their current LOF IND.   Baseline:  Goal status: INITIAL    PLAN:  PT FREQUENCY: 1-2x/week  PT DURATION: 12 weeks  PLANNED INTERVENTIONS: 97110-Therapeutic exercises, 97530- Therapeutic activity, [id number]- Neuromuscular re-education, 97535- Self Care, [id number]- Manual therapy, [id number]- Gait training, [phone]- Electrical stimulation (unattended), 20560 (1-2 muscles), 20561 (3+ muscles)- Dry Needling, Patient/Family education, Cryotherapy, and Moist heat.  PLAN FOR NEXT SESSION: Review/ update HEP PRN. Gait training with SBQC, gross hip strengthening, static/ dynamic balance.     Stann Ohara PT, DPT, CLT, CES 08/27/24 2:50 PM

## 2024-08-29 ENCOUNTER — Ambulatory Visit: Admitting: Physical Therapy

## 2024-08-29 ENCOUNTER — Encounter: Payer: Self-pay | Admitting: Physical Therapy

## 2024-08-29 DIAGNOSIS — R2689 Other abnormalities of gait and mobility: Secondary | ICD-10-CM

## 2024-08-29 DIAGNOSIS — M6281 Muscle weakness (generalized): Secondary | ICD-10-CM | POA: Diagnosis not present

## 2024-08-29 DIAGNOSIS — M5459 Other low back pain: Secondary | ICD-10-CM

## 2024-08-29 NOTE — Therapy (Signed)
 OUTPATIENT PHYSICAL THERAPY THORACOLUMBAR TREATMENT   Patient Name: Danielle Proctor MRN: [id number] DOB:[birth year], 89 y.o., female Today's Date: 08/29/2024  END OF SESSION:  PT End of Session - 08/29/24 1058     Visit Number 7    Number of Visits 17    Date for Recertification  09/30/24    Authorization Type [employer] M[employer]    Authorization Time Period 08/05/24 - 11/03/2024    Authorization - Visit Number 5    Authorization - Number of Visits 10    Progress Note Due on Visit 10    PT Start Time 1100    PT Stop Time 1141    PT Time Calculation (min) 41 min    Equipment Utilized During Treatment Gait belt    Activity Tolerance Patient tolerated treatment well    Behavior During Therapy WFL for tasks assessed/performed;Impulsive               Past Medical History:  Diagnosis Date   Atherosclerotic peripheral vascular disease    Diverticulosis    Hypercholesteremia    Hypertension    IBS (irritable bowel syndrome)    Osteoarthritis    Osteoporosis    Thyroid  disease    Past Surgical History:  Procedure Laterality Date   APPENDECTOMY     CHOLECYSTECTOMY     COLON SURGERY     ORIF PELVIC FRACTURE WITH PERCUTANEOUS SCREWS Bilateral 04/12/2024   Procedure: CLOSED REDUCTION, PELVIS, WITH PERCUTANEOUS FIXATION;  Surgeon: Kendal Franky SQUIBB, MD;  Location: [facility] OR;  Service: Orthopedics;  Laterality: Bilateral;   Patient Active Problem List   Diagnosis Date Noted   Fall 04/11/2024   Closed fracture of transverse process of lumbar vertebra (HCC) 04/11/2024   Sacral fracture (HCC) 04/10/2024   Memory loss 03/07/2024   Hypertension 05/23/2022    PCP: Clarice Nottingham, MD  REFERRING PROVIDER: Kendal Franky SQUIBB, MD   REFERRING DIAG: Pelvic fracture (HCC) [D67.9XXA]   Rationale for Evaluation and Treatment: Rehabilitation  THERAPY DIAG:  Other low back pain  Muscle weakness (generalized)  Other abnormalities of gait and mobility  ONSET DATE: DOS  04/12/24  SUBJECTIVE:                                                                                                                                                                                           SUBJECTIVE STATEMENT: 08/29/2024 Pt reports that she continues to have pain but sees slow improvement.  Her pain is <5/10.  Eval: Pt reports she fell backward and landed on her bottom and noted immediate pain and went to the ED she had ORIF of the sacrum on  04/12/24. She had home health PT for a few visits. She reports 4/10 but the pain fluctuates but doesn't limit her. Since the surgery she has noted significant improvement with RW. Prior to this injury patient was independent with no AD.   PERTINENT HISTORY:  Osteoporosis see PMHx  PAIN:  Are you having pain? Yes: NPRS scale: 1-2/10 Pain location: on the L low back/ sacrum Pain description: little  Aggravating factors: sitting on a firm surface Relieving factors: Tylenol , aspercreme  PRECAUTIONS: Fall  RED FLAGS: None   WEIGHT BEARING RESTRICTIONS: No  FALLS:  Has patient fallen in last 6 months? Yes. Number of falls 1  LIVING ENVIRONMENT: Lives with: lives with an adult companion Lives in: House/apartment Stairs: Yes: External: 3 steps; on right going up Has following equipment at home: Vannie - 2 wheeled and SBQD, shower chair.   OCCUPATION: retired  PLOF: Independent with basic ADLs  PATIENT GOALS: to be able to take a bath   OBJECTIVE:  Note: Objective measures were completed at Evaluation unless otherwise noted.  DIAGNOSTIC FINDINGS:  See chart  PATIENT SURVEYS:  LEFS  Extreme difficulty/unable (0), Quite a bit of difficulty (1), Moderate difficulty (2), Little difficulty (3), No difficulty (4) Survey date:  08/05/24  Any of your usual work, housework or school activities 2  2. Usual hobbies, recreational or sporting activities 1  3. Getting into/out of the bath 0  4. Walking between rooms 3  5.  Putting on socks/shoes 4  6. Squatting  3  7. Lifting an object, like a bag of groceries from the floor 1  8. Performing light activities around your home 3  9. Performing heavy activities around your home 0  10. Getting into/out of a car 3  11. Walking 2 blocks 4  12. Walking 1 mile 0  13. Going up/down 10 stairs (1 flight) 2  14. Standing for 1 hour 0  15.  sitting for 1 hour 4  16. Running on even ground 0  17. Running on uneven ground 0  18. Making sharp turns while running fast 0  19. Hopping  0  20. Rolling over in bed 4  Score total:  34/80     COGNITION: Overall cognitive status: Within functional limits for tasks assessed     SENSATION: Not tested    POSTURE: rounded shoulders and forward head  PALPATION: Standing: Increased resting tension in L paraspinals group. TTP L PSIS. Sitting: TTP PSIS L>R.   LUMBAR ROM:   AROM eval  Flexion 80  Extension   Right lateral flexion 10  Left lateral flexion 30  Right rotation   Left rotation    (Blank rows = not tested)  LOWER EXTREMITY ROM:     Active  Right eval Left eval  Hip flexion    Hip extension    Hip abduction    Hip adduction    Hip internal rotation    Hip external rotation    Knee flexion    Knee extension    Ankle dorsiflexion    Ankle plantarflexion    Ankle inversion    Ankle eversion     (Blank rows = not tested)  LOWER EXTREMITY MMT:    MMT Right eval Left eval  Hip flexion 4+/5 4-/5  Hip extension    Hip abduction 4-/5 3+/5  Hip adduction 5/5 4/5  Hip internal rotation    Hip external rotation    Knee flexion 4+/5 4/5  Knee extension 4/5 4-/5  Ankle dorsiflexion 5/5 5/5  Ankle plantarflexion 5/5 5/5  Ankle inversion    Ankle eversion     (Blank rows = not tested)  LUMBAR SPECIAL TESTS:    FUNCTIONAL TESTS:  5 times sit to stand: 8.0s from elevated surface, 11s from standard chair height, no HHA. Timed up and go (TUG): 36s with NBQC  GAIT: Distance walked: From  Lobby to assessment room Assistive device utilized: [employer] - 2 wheeled Level of assistance: Modified independence with RW and SBA with quad cane Comments: With RW pt has wide BOS, uses reciprocal pattern with inc EV in stance phase. Uses dec cadence, but maintains upright posture without LOB during reaching or directional changes. Gait speed decreases significantly with use of NBQC in RUE demonstrating dec stride length greater on L vs right, slight forward flexed position following change in AD.    TREATMENT:   [facility] Adult PT Treatment:                                                DATE: 08/29/24 Therapeutic Exercise: Recumbent bike x5 mins level 3 resistance Supine bridging on ball 2x5 - 5'' hold  Neuromuscular re-ed: Tandem stance Blue rocker board DF/PF with decreasing UE support  Therapeutic Activity  Obstacle course consisting of unilateral cone step overs, lateral walking on airex beam, and fig 8 - CGA Slow unilateral step overs - no AD, CGA STS - 2x10   [facility] Adult PT Treatment:                                                DATE: 08/22/24 Nu-step LE only L5 x 5 min  Obstacle course starting on airex pad and stepping over 5 varying height obstacles in // Standing rhomberg balance on airex eyes closed in //  Marching in place on airex in // holding on to NBQC Sit to stand 3 x 10 , 1 set with 5# KB, 2 set with 10# KB Standing hip abduction 2x 10 bil with YTB Seated marching with YTB 2 x 10 Reviewed and updated HEp and provided YTB  [facility] Adult PT Treatment:                                                DATE: 08/20/24 565 ft during 6 min walk qith NBQC Stepping up/ down 4 inch step with NBQC x 10 High knee marching (tactile cues needed) with NBQC Squatting and picking up cones x 10 while holding on to NBQC Rhomberg balance standing on airex pad with NBQCwith head turns Gait walking to the car down declined surface (parking lot)  with NBQC Discussed  benefit of practicing and using QC at home and RW with any unfamiliar places .   [facility] Adult PT Treatment:                                                DATE: 08/15/24 Therapeutic Exercise:  NuStep x4 mins for active warmup level 5 resist without UE use and to promote low grade cyclic loading to BLE. Recommend recumbent bike in future sessions as will likely have improved fitment with LE and seat position.  Supine SKTC 3x15 BLE through pain free ROM with good tolerance and light stretch noted in posterior L hip, near identical ROM on RLE Supine bridge 2x20, 2 sec hold without reproduction of c/c Lateral step downs from foam pad with cane in RUE. Pt completes 3x10 with LLE and repeats on RLE. Good tolerance, CGA fading to SBA with good control and endurance Neuromuscular re-ed: Sit to Stand onto foam pad 3x5 with HHA on armrests prn FT EO on foam pad x30s min sway, no HHA, supervision A FT EC on foam pad 3x30s with SBA progressig from moderate sway and instability initially to min sway in the 3rd set Gait Training: Gait training with NBQC to promote improved reciprocal pattern, step through, and symmetric quality. Cane height adjusted to improve efficiency of position and to improve point of contact with cane closer to feet. Pt able to progress from step to to step through with this change. Overall cadence and confidence improve with practice over 566ft.  Step up and over with foam pad and NBQC x8 laps with CGA, with improving coordination as intervention progresses.                                                                                      PATIENT EDUCATION:  Education details: evaluation findings, POC, goals, HEP with proper form/ rationale.  Person educated: Patient and Child(ren) Education method: Explanation, Demonstration, Verbal cues, and Handouts Education comprehension: verbalized understanding  HOME EXERCISE PROGRAM: Access Code: [email or web address] URL:  [email or web address] Date: 08/22/2024 Prepared by: Joneen Fresh  Exercises - Clam  - 4 x weekly - 3 sets - 8 reps - 2 hold - Sit to Stand  - 7 x weekly - 3 sets - 10 reps - Standing March with Counter Support  - 1 x daily - 7 x weekly - 1 sets - 10 reps - 5 hold - Standing Hip Abduction with Resistance at Ankles and Counter Support  - 1 x daily - 7 x weekly - 2 sets - 10 reps - Seated March with Resistance  - 1 x daily - 7 x weekly - 2 sets - 10 reps  ASSESSMENT:  CLINICAL IMPRESSION: 08/29/2024 Pt has been responding well to her current regimen.  Emphasized static and dynamic balance today.  Pt did well with unilateral step overs but did require significant cuing for pacing throughout.  Minimal increase in pain during session.  Pt remains limited in balance when stepping over objects and requires CGA and HHA for support.   EVAL: Patient is a 89 y.o. F who was seen today for physical therapy evaluation and treatment for dx of pelvic fx with ORIF of sacrum on 04/12/24. She has functional trunk mobilty with the exception of R sidebending secondary to L lumabr paraspinal tension. LE strength is [facility] but has room for improvement, she demonstrates limited endurance/ stamina as well as confidence with balance without her RW. Practiced us  NBQC exhibiting  and large step length on the L compared bil requiring frequent verbal and tactile cues on proper gait pattern during assessment. She would benefit from physical therapy do promote strength and endurance, maximize stability and safety with gait with LRAD, and maximize her function by addressing the deficits listed.   OBJECTIVE IMPAIRMENTS: Abnormal gait, decreased activity tolerance, decreased balance, decreased endurance, decreased knowledge of use of DME, decreased ROM, decreased strength, increased fascial restrictions, increased muscle spasms, and pain.   ACTIVITY LIMITATIONS: carrying, lifting, standing, stairs, bathing, and  locomotion level  PARTICIPATION LIMITATIONS: meal prep, cleaning, laundry, shopping, and community activity  PERSONAL FACTORS: Age, Past/current experiences, and 1-2 comorbidities: Falls, and osteoporosis are also affecting patient's functional outcome.   REHAB POTENTIAL: Excellent  CLINICAL DECISION MAKING: Evolving/moderate complexity  EVALUATION COMPLEXITY: Moderate   GOALS: Goals reviewed with patient? Yes  SHORT TERM GOALS: Target date: 09/02/2024  Pt will be IND with initial HEP for therapeutic progression Baseline: no prior HEP Goal status: INITIAL  2.  Pt to verbalize and demo efficient gait pattern with LRAD to maximize safety and balance while increasing mobility.  Baseline:  Goal status: INITIAL  3.  Pt to be report pain to </= 2/10 with standing/ walking and sitting on hard surfaces to demo improving condition.  Baseline:  Goal status: INITIAL   LONG TERM GOALS: Target date: 09/30/2024  Pt to increase bil LE strength to to >/= 4+/5 to promote stability with walking/ standing to maximize safety. Baseline:  Goal status: INITIAL  2.  Improve LEFS to >/= 55/80 to demonstrate improvement in function.  Baseline:  Goal status: INITIAL  3.  Reduce tug time to </= 16 sec with LRAD for safety and stability with walking/ standing  Baseline:  Goal status: INITIAL  4.  Pt to be able to navigate obstacles of varying heights and firm and unlevel surfaces with LRAD and report feeling stabile and no fear of falling.  Baseline:  Goal status: INITIAL   5.  PT to be IND with all HEP given and is able to maintain and progress their current LOF IND.  Baseline:  Goal status: INITIAL    PLAN:  PT FREQUENCY: 1-2x/week  PT DURATION: 12 weeks  PLANNED INTERVENTIONS: 97110-Therapeutic exercises, 97530- Therapeutic activity, [id number]- Neuromuscular re-education, 97535- Self Care, [id number]- Manual therapy, [id number]- Gait training, [phone]- Electrical stimulation (unattended), [id number]  (1-2 muscles), 20561 (3+ muscles)- Dry Needling, Patient/Family education, Cryotherapy, and Moist heat.  PLAN FOR NEXT SESSION: Review/ update HEP PRN. Gait training with SBQC, gross hip strengthening, static/ dynamic balance.     Lanah Steines E Dany Walther PT 08/29/24 11:52 AM

## 2024-09-03 ENCOUNTER — Ambulatory Visit: Admitting: Physical Therapy

## 2024-09-03 ENCOUNTER — Encounter: Payer: Self-pay | Admitting: Physical Therapy

## 2024-09-03 DIAGNOSIS — R2689 Other abnormalities of gait and mobility: Secondary | ICD-10-CM

## 2024-09-03 DIAGNOSIS — M5459 Other low back pain: Secondary | ICD-10-CM | POA: Diagnosis not present

## 2024-09-03 DIAGNOSIS — M6281 Muscle weakness (generalized): Secondary | ICD-10-CM

## 2024-09-03 NOTE — Therapy (Signed)
 OUTPATIENT PHYSICAL THERAPY THORACOLUMBAR TREATMENT   Patient Name: Danielle Proctor MRN: [id number] DOB:08/12/1929, 89 y.o., female Today's Date: 09/03/2024  END OF SESSION:  PT End of Session - 09/03/24 1126     Visit Number 8    Number of Visits 17    Date for Recertification  09/30/24    Authorization Type [employer] M[employer]    Authorization Time Period 08/05/24 - 11/03/2024    Authorization - Visit Number 8    Authorization - Number of Visits 10    Progress Note Due on Visit 10    PT Start Time 1126    PT Stop Time 1210    PT Time Calculation (min) 44 min    Equipment Utilized During Treatment Gait belt    Activity Tolerance Patient tolerated treatment well    Behavior During Therapy WFL for tasks assessed/performed;Impulsive                Past Medical History:  Diagnosis Date   Atherosclerotic peripheral vascular disease    Diverticulosis    Hypercholesteremia    Hypertension    IBS (irritable bowel syndrome)    Osteoarthritis    Osteoporosis    Thyroid  disease    Past Surgical History:  Procedure Laterality Date   APPENDECTOMY     CHOLECYSTECTOMY     COLON SURGERY     ORIF PELVIC FRACTURE WITH PERCUTANEOUS SCREWS Bilateral 04/12/2024   Procedure: CLOSED REDUCTION, PELVIS, WITH PERCUTANEOUS FIXATION;  Surgeon: Kendal Franky SQUIBB, MD;  Location: [facility] OR;  Service: Orthopedics;  Laterality: Bilateral;   Patient Active Problem List   Diagnosis Date Noted   Fall 04/11/2024   Closed fracture of transverse process of lumbar vertebra (HCC) 04/11/2024   Sacral fracture (HCC) 04/10/2024   Memory loss 03/07/2024   Hypertension 05/23/2022    PCP: Clarice Nottingham, MD  REFERRING PROVIDER: Kendal Franky SQUIBB, MD   REFERRING DIAG: Pelvic fracture (HCC) [D67.9XXA]   Rationale for Evaluation and Treatment: Rehabilitation  THERAPY DIAG:  Other low back pain  Muscle weakness (generalized)  Other abnormalities of gait and mobility  ONSET DATE: DOS  04/12/24  SUBJECTIVE:                                                                                                                                                                                           SUBJECTIVE STATEMENT: 09/03/2024 Pt states that she has been having low back pain worse in the mornings, improves with meds and as the day progresses.   Eval: Pt reports she fell backward and landed on her bottom and noted immediate pain and went to the ED  she had ORIF of the sacrum on 04/12/24. She had home health PT for a few visits. She reports 4/10 but the pain fluctuates but doesn't limit her. Since the surgery she has noted significant improvement with RW. Prior to this injury patient was independent with no AD.   PERTINENT HISTORY:  Osteoporosis see PMHx  PAIN:  Are you having pain? Yes: NPRS scale: 1-2/10 Pain location: on the L low back/ sacrum Pain description: little  Aggravating factors: sitting on a firm surface Relieving factors: Tylenol , aspercreme  PRECAUTIONS: Fall  RED FLAGS: None   WEIGHT BEARING RESTRICTIONS: No  FALLS:  Has patient fallen in last 6 months? Yes. Number of falls 1  LIVING ENVIRONMENT: Lives with: lives with an adult companion Lives in: House/apartment Stairs: Yes: External: 3 steps; on right going up Has following equipment at home: Vannie - 2 wheeled and SBQD, shower chair.   OCCUPATION: retired  PLOF: Independent with basic ADLs  PATIENT GOALS: to be able to take a bath   OBJECTIVE:  Note: Objective measures were completed at Evaluation unless otherwise noted.  DIAGNOSTIC FINDINGS:  See chart  PATIENT SURVEYS:  LEFS  Extreme difficulty/unable (0), Quite a bit of difficulty (1), Moderate difficulty (2), Little difficulty (3), No difficulty (4) Survey date:  08/05/24  Any of your usual work, housework or school activities 2  2. Usual hobbies, recreational or sporting activities 1  3. Getting into/out of the bath 0  4.  Walking between rooms 3  5. Putting on socks/shoes 4  6. Squatting  3  7. Lifting an object, like a bag of groceries from the floor 1  8. Performing light activities around your home 3  9. Performing heavy activities around your home 0  10. Getting into/out of a car 3  11. Walking 2 blocks 4  12. Walking 1 mile 0  13. Going up/down 10 stairs (1 flight) 2  14. Standing for 1 hour 0  15.  sitting for 1 hour 4  16. Running on even ground 0  17. Running on uneven ground 0  18. Making sharp turns while running fast 0  19. Hopping  0  20. Rolling over in bed 4  Score total:  34/80     COGNITION: Overall cognitive status: Within functional limits for tasks assessed     SENSATION: Not tested    POSTURE: rounded shoulders and forward head  PALPATION: Standing: Increased resting tension in L paraspinals group. TTP L PSIS. Sitting: TTP PSIS L>R.   LUMBAR ROM:   AROM eval  Flexion 80  Extension   Right lateral flexion 10  Left lateral flexion 30  Right rotation   Left rotation    (Blank rows = not tested)  LOWER EXTREMITY ROM:     Active  Right eval Left eval  Hip flexion    Hip extension    Hip abduction    Hip adduction    Hip internal rotation    Hip external rotation    Knee flexion    Knee extension    Ankle dorsiflexion    Ankle plantarflexion    Ankle inversion    Ankle eversion     (Blank rows = not tested)  LOWER EXTREMITY MMT:    MMT Right eval Left eval  Hip flexion 4+/5 4-/5  Hip extension    Hip abduction 4-/5 3+/5  Hip adduction 5/5 4/5  Hip internal rotation    Hip external rotation    Knee flexion 4+/5  4/5  Knee extension 4/5 4-/5  Ankle dorsiflexion 5/5 5/5  Ankle plantarflexion 5/5 5/5  Ankle inversion    Ankle eversion     (Blank rows = not tested)  LUMBAR SPECIAL TESTS:    FUNCTIONAL TESTS:  5 times sit to stand: 8.0s from elevated surface, 11s from standard chair height, no HHA. Timed up and go (TUG): 36s with  NBQC  GAIT: Distance walked: From Lobby to assessment room Assistive device utilized: [employer] - 2 wheeled Level of assistance: Modified independence with RW and SBA with quad cane Comments: With RW pt has wide BOS, uses reciprocal pattern with inc EV in stance phase. Uses dec cadence, but maintains upright posture without LOB during reaching or directional changes. Gait speed decreases significantly with use of NBQC in RUE demonstrating dec stride length greater on L vs right, slight forward flexed position following change in AD.    TREATMENT:   [facility] Adult PT Treatment:                                                DATE: 09/03/24 Therapeutic Exercise: Recumbent bike x8 mins level 3 resistance Mod hook lying trunk rotation x30 in physioball Supine bridges with straight leg on physioball for quad bias 3x10 Marching in slight abd 3x10 alt between reps, green TB Clams in sidelying with green TB 3x10 BLE alternating after lateral completion Gait training: Without AD level surface, good reciprocity, after 150ft pt has evidence of antalgic gait on LLE with weight shift evident in LLE stance and dec RLE stride, maintains balance, good directional changes Obstacle course hurdles, airex, foam step pad cues for avoiding circumduction over hurdles, able to improve with subsequent trials Manual Therapy: Manual soft tissue mobilization to psoas major with reduction in symptoms with decreased tolerance to deep pressure, maintained light to moderate pressure and strokes, placed in thomas position and requires manual assist to control level of elongation, x30s with symptoms abolishing     [facility] Adult PT Treatment:                                                DATE: 08/29/24 Therapeutic Exercise: Recumbent bike x5 mins level 3 resistance Supine bridging on ball 2x5 - 5'' hold  Neuromuscular re-ed: Tandem stance Blue rocker board DF/PF with decreasing UE support  Therapeutic  Activity  Obstacle course consisting of unilateral cone step overs, lateral walking on airex beam, and fig 8 - CGA Slow unilateral step overs - no AD, CGA STS - 2x10   [facility] Adult PT Treatment:                                                DATE: 08/22/24 Nu-step LE only L5 x 5 min  Obstacle course starting on airex pad and stepping over 5 varying height obstacles in // Standing rhomberg balance on airex eyes closed in //  Marching in place on airex in // holding on to NBQC Sit to stand 3 x 10 , 1 set with 5# KB, 2 set with 10# KB  Standing hip abduction 2x 10 bil with YTB Seated marching with YTB 2 x 10 Reviewed and updated HEp and provided YTB  [facility] Adult PT Treatment:                                                DATE: 08/20/24 565 ft during 6 min walk qith NBQC Stepping up/ down 4 inch step with NBQC x 10 High knee marching (tactile cues needed) with NBQC Squatting and picking up cones x 10 while holding on to NBQC Rhomberg balance standing on airex pad with NBQCwith head turns Gait walking to the car down declined surface (parking lot)  with NBQC Discussed benefit of practicing and using QC at home and RW with any unfamiliar places .                                                                                    PATIENT EDUCATION:  Education details: evaluation findings, POC, goals, HEP with proper form/ rationale.  Person educated: Patient and Child(ren) Education method: Explanation, Demonstration, Verbal cues, and Handouts Education comprehension: verbalized understanding  HOME EXERCISE PROGRAM: Access Code: [email or web address] URL: [email or web address] Date: 08/22/2024 Prepared by: Joneen Fresh  Exercises - Clam  - 4 x weekly - 3 sets - 8 reps - 2 hold - Sit to Stand  - 7 x weekly - 3 sets - 10 reps - Standing March with Counter Support  - 1 x daily - 7 x weekly - 1 sets - 10 reps - 5 hold - Standing Hip Abduction with Resistance at Ankles and Counter  Support  - 1 x daily - 7 x weekly - 2 sets - 10 reps - Seated March with Resistance  - 1 x daily - 7 x weekly - 2 sets - 10 reps  ASSESSMENT:  CLINICAL IMPRESSION: 09/03/2024 Pt has been responding well to her current regimen.  Does well with intrinsic feedback vs tactile or verbal cues only. Pt able to demonstrate improved tolerance to obstacle negotiation and balance with dynamic motor control tasks. Pt progressing resistance tolerance with LE strengthening.   EVAL: Patient is a 89 y.o. F who was seen today for physical therapy evaluation and treatment for dx of pelvic fx with ORIF of sacrum on 04/12/24. She has functional trunk mobilty with the exception of R sidebending secondary to L lumabr paraspinal tension. LE strength is [facility] but has room for improvement, she demonstrates limited endurance/ stamina as well as confidence with balance without her RW. Practiced us  NBQC exhibiting and large step length on the L compared bil requiring frequent verbal and tactile cues on proper gait pattern during assessment. She would benefit from physical therapy do promote strength and endurance, maximize stability and safety with gait with LRAD, and maximize her function by addressing the deficits listed.   OBJECTIVE IMPAIRMENTS: Abnormal gait, decreased activity tolerance, decreased balance, decreased endurance, decreased knowledge of use of DME, decreased ROM, decreased strength, increased fascial restrictions, increased muscle spasms, and pain.   ACTIVITY LIMITATIONS: carrying, lifting, standing, stairs, bathing,  and locomotion level  PARTICIPATION LIMITATIONS: meal prep, cleaning, laundry, shopping, and community activity  PERSONAL FACTORS: Age, Past/current experiences, and 1-2 comorbidities: Falls, and osteoporosis are also affecting patient's functional outcome.   REHAB POTENTIAL: Excellent  CLINICAL DECISION MAKING: Evolving/moderate complexity  EVALUATION COMPLEXITY: Moderate   GOALS: Goals  reviewed with patient? Yes  SHORT TERM GOALS: Target date: 09/02/2024  Pt will be IND with initial HEP for therapeutic progression Baseline: no prior HEP Goal status: INITIAL  2.  Pt to verbalize and demo efficient gait pattern with LRAD to maximize safety and balance while increasing mobility.  Baseline:  Goal status: INITIAL  3.  Pt to be report pain to </= 2/10 with standing/ walking and sitting on hard surfaces to demo improving condition.  Baseline:  Goal status: INITIAL   LONG TERM GOALS: Target date: 09/30/2024  Pt to increase bil LE strength to to >/= 4+/5 to promote stability with walking/ standing to maximize safety. Baseline:  Goal status: INITIAL  2.  Improve LEFS to >/= 55/80 to demonstrate improvement in function.  Baseline:  Goal status: INITIAL  3.  Reduce tug time to </= 16 sec with LRAD for safety and stability with walking/ standing  Baseline:  Goal status: INITIAL  4.  Pt to be able to navigate obstacles of varying heights and firm and unlevel surfaces with LRAD and report feeling stabile and no fear of falling.  Baseline:  Goal status: INITIAL   5.  PT to be IND with all HEP given and is able to maintain and progress their current LOF IND.  Baseline:  Goal status: INITIAL    PLAN:  PT FREQUENCY: 1-2x/week  PT DURATION: 12 weeks  PLANNED INTERVENTIONS: 97110-Therapeutic exercises, 97530- Therapeutic activity, [id number]- Neuromuscular re-education, 97535- Self Care, [id number]- Manual therapy, [id number]- Gait training, [id number]- Electrical stimulation (unattended), 20560 (1-2 muscles), [id number] (3+ muscles)- Dry Needling, Patient/Family education, Cryotherapy, and Moist heat.  PLAN FOR NEXT SESSION: Review/ update HEP PRN. Gait training with SBQC, gross hip strengthening, static/ dynamic balance.     Stann DELENA Ohara PT 09/03/24 1:19 PM

## 2024-09-05 ENCOUNTER — Encounter: Payer: Self-pay | Admitting: Physical Therapy

## 2024-09-05 ENCOUNTER — Ambulatory Visit: Admitting: Physical Therapy

## 2024-09-05 DIAGNOSIS — R2689 Other abnormalities of gait and mobility: Secondary | ICD-10-CM | POA: Diagnosis not present

## 2024-09-05 DIAGNOSIS — M6281 Muscle weakness (generalized): Secondary | ICD-10-CM | POA: Diagnosis not present

## 2024-09-05 DIAGNOSIS — M5459 Other low back pain: Secondary | ICD-10-CM

## 2024-09-05 NOTE — Therapy (Signed)
 OUTPATIENT PHYSICAL THERAPY THORACOLUMBAR TREATMENT   Patient Name: Danielle Proctor MRN: [id number] DOB:09-Oct-1929, 89 y.o., female Today's Date: 09/05/2024  END OF SESSION:  PT End of Session - 09/05/24 1121     Visit Number 9    Number of Visits 17    Date for Recertification  09/30/24    Authorization Type [employer] M[employer]    Authorization Time Period 08/05/24 - 11/03/2024    Authorization - Visit Number 9    Authorization - Number of Visits 10    Progress Note Due on Visit 10    PT Start Time 1124    PT Stop Time 1210    PT Time Calculation (min) 46 min    Equipment Utilized During Treatment Gait belt    Activity Tolerance Patient tolerated treatment well    Behavior During Therapy WFL for tasks assessed/performed;Impulsive                 Past Medical History:  Diagnosis Date   Atherosclerotic peripheral vascular disease    Diverticulosis    Hypercholesteremia    Hypertension    IBS (irritable bowel syndrome)    Osteoarthritis    Osteoporosis    Thyroid  disease    Past Surgical History:  Procedure Laterality Date   APPENDECTOMY     CHOLECYSTECTOMY     COLON SURGERY     ORIF PELVIC FRACTURE WITH PERCUTANEOUS SCREWS Bilateral 04/12/2024   Procedure: CLOSED REDUCTION, PELVIS, WITH PERCUTANEOUS FIXATION;  Surgeon: Kendal Franky SQUIBB, MD;  Location: [facility] OR;  Service: Orthopedics;  Laterality: Bilateral;   Patient Active Problem List   Diagnosis Date Noted   Fall 04/11/2024   Closed fracture of transverse process of lumbar vertebra (HCC) 04/11/2024   Sacral fracture (HCC) 04/10/2024   Memory loss 03/07/2024   Hypertension 05/23/2022    PCP: Clarice Nottingham, MD  REFERRING PROVIDER: Kendal Franky SQUIBB, MD   REFERRING DIAG: Pelvic fracture (HCC) [D67.9XXA]   Rationale for Evaluation and Treatment: Rehabilitation  THERAPY DIAG:  Other low back pain  Muscle weakness (generalized)  Other abnormalities of gait and mobility  ONSET DATE: DOS  04/12/24  SUBJECTIVE:                                                                                                                                                                                           SUBJECTIVE STATEMENT: 09/05/2024 Pt states that she has been doing well since her previous session, notes inc symptoms in the mornings and at the end of the day.   Eval: Pt reports she fell backward and landed on her bottom and noted immediate pain and  went to the ED she had ORIF of the sacrum on 04/12/24. She had home health PT for a few visits. She reports 4/10 but the pain fluctuates but doesn't limit her. Since the surgery she has noted significant improvement with RW. Prior to this injury patient was independent with no AD.   PERTINENT HISTORY:  Osteoporosis see PMHx  PAIN:  Are you having pain? Yes: NPRS scale: 1-2/10 Pain location: on the L low back/ sacrum Pain description: little  Aggravating factors: sitting on a firm surface Relieving factors: Tylenol , aspercreme  PRECAUTIONS: Fall  RED FLAGS: None   WEIGHT BEARING RESTRICTIONS: No  FALLS:  Has patient fallen in last 6 months? Yes. Number of falls 1  LIVING ENVIRONMENT: Lives with: lives with an adult companion Lives in: House/apartment Stairs: Yes: External: 3 steps; on right going up Has following equipment at home: Vannie - 2 wheeled and SBQD, shower chair.   OCCUPATION: retired  PLOF: Independent with basic ADLs  PATIENT GOALS: to be able to take a bath   OBJECTIVE:  Note: Objective measures were completed at Evaluation unless otherwise noted.  DIAGNOSTIC FINDINGS:  See chart  PATIENT SURVEYS:  LEFS  Extreme difficulty/unable (0), Quite a bit of difficulty (1), Moderate difficulty (2), Little difficulty (3), No difficulty (4) Survey date:  08/05/24  Any of your usual work, housework or school activities 2  2. Usual hobbies, recreational or sporting activities 1  3. Getting into/out of the bath 0   4. Walking between rooms 3  5. Putting on socks/shoes 4  6. Squatting  3  7. Lifting an object, like a bag of groceries from the floor 1  8. Performing light activities around your home 3  9. Performing heavy activities around your home 0  10. Getting into/out of a car 3  11. Walking 2 blocks 4  12. Walking 1 mile 0  13. Going up/down 10 stairs (1 flight) 2  14. Standing for 1 hour 0  15.  sitting for 1 hour 4  16. Running on even ground 0  17. Running on uneven ground 0  18. Making sharp turns while running fast 0  19. Hopping  0  20. Rolling over in bed 4  Score total:  34/80     COGNITION: Overall cognitive status: Within functional limits for tasks assessed     SENSATION: Not tested    POSTURE: rounded shoulders and forward head  PALPATION: Standing: Increased resting tension in L paraspinals group. TTP L PSIS. Sitting: TTP PSIS L>R.   LUMBAR ROM:   AROM eval  Flexion 80  Extension   Right lateral flexion 10  Left lateral flexion 30  Right rotation   Left rotation    (Blank rows = not tested)  LOWER EXTREMITY ROM:     Active  Right eval Left eval  Hip flexion    Hip extension    Hip abduction    Hip adduction    Hip internal rotation    Hip external rotation    Knee flexion    Knee extension    Ankle dorsiflexion    Ankle plantarflexion    Ankle inversion    Ankle eversion     (Blank rows = not tested)  LOWER EXTREMITY MMT:    MMT Right eval Left eval  Hip flexion 4+/5 4-/5  Hip extension    Hip abduction 4-/5 3+/5  Hip adduction 5/5 4/5  Hip internal rotation    Hip external rotation  Knee flexion 4+/5 4/5  Knee extension 4/5 4-/5  Ankle dorsiflexion 5/5 5/5  Ankle plantarflexion 5/5 5/5  Ankle inversion    Ankle eversion     (Blank rows = not tested)  LUMBAR SPECIAL TESTS:    FUNCTIONAL TESTS:  5 times sit to stand: 8.0s from elevated surface, 11s from standard chair height, no HHA. Timed up and go (TUG): 36s with  NBQC  GAIT: Distance walked: From Lobby to assessment room Assistive device utilized: [employer] - 2 wheeled Level of assistance: Modified independence with RW and SBA with quad cane Comments: With RW pt has wide BOS, uses reciprocal pattern with inc EV in stance phase. Uses dec cadence, but maintains upright posture without LOB during reaching or directional changes. Gait speed decreases significantly with use of NBQC in RUE demonstrating dec stride length greater on L vs right, slight forward flexed position following change in AD.    TREATMENT:  [facility] Adult PT Treatment:                                                DATE: 09/05/24 Therapeutic Exercise: NuStep level 5 x6 mins  Standing hip abd with green TB resistance, HHA PRN 3x8 alternating between reps Heel raises without support 3x10 Supine marching red TB 3x10 BLE alternating between reps Modified lunge 3x10 BLE Supine physioball press 2x15, 2 sec hold HEP revised and provided, reviewed  Gait training: Tandem walking x19ft with CGA and gait belt Gait training level surfaces without AD increasing obstacle negotiation including slight elevation changes, directional changes, and small spaces.     [facility] Adult PT Treatment:                                                DATE: 09/03/24 Therapeutic Exercise: Recumbent bike x8 mins level 3 resistance Mod hook lying trunk rotation x30 in physioball Supine bridges with straight leg on physioball for quad bias 3x10 Marching in slight abd 3x10 alt between reps, green TB Clams in sidelying with green TB 3x10 BLE alternating after lateral completion Gait training: Without AD level surface, good reciprocity, after 170ft pt has evidence of antalgic gait on LLE with weight shift evident in LLE stance and dec RLE stride, maintains balance, good directional changes Obstacle course hurdles, airex, foam step pad cues for avoiding circumduction over hurdles, able to improve  with subsequent trials Manual Therapy: Manual soft tissue mobilization to psoas major with reduction in symptoms with decreased tolerance to deep pressure, maintained light to moderate pressure and strokes, placed in thomas position and requires manual assist to control level of elongation, x30s with symptoms abolishing     [facility] Adult PT Treatment:                                                DATE: 08/29/24 Therapeutic Exercise: Recumbent bike x5 mins level 3 resistance Supine bridging on ball 2x5 - 5'' hold  Neuromuscular re-ed: Tandem stance Blue rocker board DF/PF with decreasing UE support  Therapeutic Activity  Obstacle course consisting of unilateral cone  step overs, lateral walking on airex beam, and fig 8 - CGA Slow unilateral step overs - no AD, CGA STS - 2x10                                                                                     PATIENT EDUCATION:  Education details: evaluation findings, POC, goals, HEP with proper form/ rationale.  Person educated: Patient and Child(ren) Education method: Explanation, Demonstration, Verbal cues, and Handouts Education comprehension: verbalized understanding  HOME EXERCISE PROGRAM: Access Code: [email or web address] URL: [email or web address] Date: 09/05/2024 Prepared by: Stann Ohara  Exercises - Clam  - 4 x weekly - 3 sets - 8 reps - 2 hold - Sit to Stand  - 7 x weekly - 3 sets - 10 reps - Standing March with Counter Support  - 1 x daily - 7 x weekly - 1 sets - 10 reps - 5 hold - Active Straight Leg Raise with Quad Set  - 1 x daily - 7 x weekly - 2 sets - 15 reps - 2 hold - Standing 3-way Hip with Walker  - 1 x daily - 7 x weekly - 1 sets - 10 reps - 2 hold  ASSESSMENT:  CLINICAL IMPRESSION: 09/05/2024 Pt has been responding well to her current regimen and was able to progress obstacle negotiation in small spaces and improve tolerance to functional mobility without AD. Has inc difficulty with NBOS and could  prove problematic if she encounters situations without physical assistance, will continue to progress. HEP modified to reflect current progress.   EVAL: Patient is a 89 y.o. F who was seen today for physical therapy evaluation and treatment for dx of pelvic fx with ORIF of sacrum on 04/12/24. She has functional trunk mobilty with the exception of R sidebending secondary to L lumabr paraspinal tension. LE strength is [facility] but has room for improvement, she demonstrates limited endurance/ stamina as well as confidence with balance without her RW. Practiced us  NBQC exhibiting and large step length on the L compared bil requiring frequent verbal and tactile cues on proper gait pattern during assessment. She would benefit from physical therapy do promote strength and endurance, maximize stability and safety with gait with LRAD, and maximize her function by addressing the deficits listed.   OBJECTIVE IMPAIRMENTS: Abnormal gait, decreased activity tolerance, decreased balance, decreased endurance, decreased knowledge of use of DME, decreased ROM, decreased strength, increased fascial restrictions, increased muscle spasms, and pain.   ACTIVITY LIMITATIONS: carrying, lifting, standing, stairs, bathing, and locomotion level  PARTICIPATION LIMITATIONS: meal prep, cleaning, laundry, shopping, and community activity  PERSONAL FACTORS: Age, Past/current experiences, and 1-2 comorbidities: Falls, and osteoporosis are also affecting patient's functional outcome.   REHAB POTENTIAL: Excellent  CLINICAL DECISION MAKING: Evolving/moderate complexity  EVALUATION COMPLEXITY: Moderate   GOALS: Goals reviewed with patient? Yes  SHORT TERM GOALS: Target date: 09/02/2024  Pt will be IND with initial HEP for therapeutic progression Baseline: no prior HEP Goal status: INITIAL  2.  Pt to verbalize and demo efficient gait pattern with LRAD to maximize safety and balance while increasing mobility.  Baseline:  Goal  status: INITIAL  3.  Pt to be report pain  to </= 2/10 with standing/ walking and sitting on hard surfaces to demo improving condition.  Baseline:  Goal status: INITIAL   LONG TERM GOALS: Target date: 09/30/2024  Pt to increase bil LE strength to to >/= 4+/5 to promote stability with walking/ standing to maximize safety. Baseline:  Goal status: INITIAL  2.  Improve LEFS to >/= 55/80 to demonstrate improvement in function.  Baseline:  Goal status: INITIAL  3.  Reduce tug time to </= 16 sec with LRAD for safety and stability with walking/ standing  Baseline:  Goal status: INITIAL  4.  Pt to be able to navigate obstacles of varying heights and firm and unlevel surfaces with LRAD and report feeling stabile and no fear of falling.  Baseline:  Goal status: INITIAL   5.  PT to be IND with all HEP given and is able to maintain and progress their current LOF IND.  Baseline:  Goal status: INITIAL    PLAN:  PT FREQUENCY: 1-2x/week  PT DURATION: 12 weeks  PLANNED INTERVENTIONS: 97110-Therapeutic exercises, 97530- Therapeutic activity, [id number]- Neuromuscular re-education, 97535- Self Care, [id number]- Manual therapy, [id number]- Gait training, [phone]- Electrical stimulation (unattended), 20560 (1-2 muscles), 20561 (3+ muscles)- Dry Needling, Patient/Family education, Cryotherapy, and Moist heat.  PLAN FOR NEXT SESSION: Review/ update HEP PRN. Gait training with SBQC, gross hip strengthening, static/ dynamic balance.     Stann DELENA Ohara PT 09/05/24 12:16 PM

## 2024-09-09 ENCOUNTER — Ambulatory Visit: Admitting: Physical Therapy

## 2024-09-09 ENCOUNTER — Encounter: Payer: Self-pay | Admitting: Physical Therapy

## 2024-09-09 DIAGNOSIS — R2689 Other abnormalities of gait and mobility: Secondary | ICD-10-CM

## 2024-09-09 DIAGNOSIS — M5459 Other low back pain: Secondary | ICD-10-CM | POA: Diagnosis not present

## 2024-09-09 DIAGNOSIS — M6281 Muscle weakness (generalized): Secondary | ICD-10-CM

## 2024-09-09 NOTE — Therapy (Signed)
 OUTPATIENT PHYSICAL THERAPY THORACOLUMBAR TREATMENT/PROGRESS NOTE   Patient Name: Danielle Proctor MRN: [id number] DOB:1929-07-12, 89 y.o., female Today's Date: 09/09/2024  END OF SESSION:  PT End of Session - 09/09/24 1136     Visit Number 10    Number of Visits 17    Date for Recertification  09/30/24    Authorization Type [employer] M[employer]    Authorization Time Period 08/05/24 - 11/03/2024    Authorization - Visit Number 10    Authorization - Number of Visits 10    Progress Note Due on Visit 20    PT Start Time 1134    PT Stop Time 1215    PT Time Calculation (min) 41 min    Equipment Utilized During Treatment Gait belt    Activity Tolerance Patient tolerated treatment well    Behavior During Therapy WFL for tasks assessed/performed;Impulsive                  Past Medical History:  Diagnosis Date   Atherosclerotic peripheral vascular disease    Diverticulosis    Hypercholesteremia    Hypertension    IBS (irritable bowel syndrome)    Osteoarthritis    Osteoporosis    Thyroid  disease    Past Surgical History:  Procedure Laterality Date   APPENDECTOMY     CHOLECYSTECTOMY     COLON SURGERY     ORIF PELVIC FRACTURE WITH PERCUTANEOUS SCREWS Bilateral 04/12/2024   Procedure: CLOSED REDUCTION, PELVIS, WITH PERCUTANEOUS FIXATION;  Surgeon: Kendal Franky SQUIBB, MD;  Location: [facility] OR;  Service: Orthopedics;  Laterality: Bilateral;   Patient Active Problem List   Diagnosis Date Noted   Fall 04/11/2024   Closed fracture of transverse process of lumbar vertebra (HCC) 04/11/2024   Sacral fracture (HCC) 04/10/2024   Memory loss 03/07/2024   Hypertension 05/23/2022    PCP: Clarice Nottingham, MD  REFERRING PROVIDER: Kendal Franky SQUIBB, MD   REFERRING DIAG: Pelvic fracture (HCC) [D67.9XXA]   Rationale for Evaluation and Treatment: Rehabilitation  THERAPY DIAG:  Other low back pain  Muscle weakness (generalized)  Other abnormalities of gait and mobility  ONSET DATE: DOS  04/12/24  SUBJECTIVE:                                                                                                                                                                                           SUBJECTIVE STATEMENT: 09/09/2024 Pt states that she has been doing well since her previous session, notes inc symptoms in the mornings and at the end of the day. Pt states that medications help. Pt reports feeling 80% improved since evaluation. Pt reports symptoms of  pain range in intensity from 0/10-4/10.  Eval: Pt reports she fell backward and landed on her bottom and noted immediate pain and went to the ED she had ORIF of the sacrum on 04/12/24. She had home health PT for a few visits. She reports 4/10 but the pain fluctuates but doesn't limit her. Since the surgery she has noted significant improvement with RW. Prior to this injury patient was independent with no AD.   PERTINENT HISTORY:  Osteoporosis see PMHx  PAIN:  Are you having pain? Yes: NPRS scale: 0/10 Pain location:   Pain description:   Aggravating factors: Relieving factors: meds  PRECAUTIONS: Fall  RED FLAGS: None   WEIGHT BEARING RESTRICTIONS: No  FALLS:  Has patient fallen in last 6 months? Yes. Number of falls 1  LIVING ENVIRONMENT: Lives with: lives with an adult companion Lives in: House/apartment Stairs: Yes: External: 3 steps; on right going up Has following equipment at home: Vannie - 2 wheeled and SBQD, shower chair.   OCCUPATION: retired  PLOF: Independent with basic ADLs  PATIENT GOALS: to be able to take a bath   OBJECTIVE:  Note: Objective measures were completed at Evaluation unless otherwise noted.  DIAGNOSTIC FINDINGS:  See chart  PATIENT SURVEYS:  LEFS  Extreme difficulty/unable (0), Quite a bit of difficulty (1), Moderate difficulty (2), Little difficulty (3), No difficulty (4) Survey date:  08/05/24 09/09/24  Any of your usual work, housework or school activities 2 2  2. Usual  hobbies, recreational or sporting activities 1 2  3. Getting into/out of the bath 0 0  4. Walking between rooms 3 2  5. Putting on socks/shoes 4 4  6. Squatting  3 4  7. Lifting an object, like a bag of groceries from the floor 1 1  8. Performing light activities around your home 3 3  9. Performing heavy activities around your home 0 0  10. Getting into/out of a car 3 3  11. Walking 2 blocks 4 4  12. Walking 1 mile 0 0  13. Going up/down 10 stairs (1 flight) 2 0  14. Standing for 1 hour 0 0  15.  sitting for 1 hour 4 4  16. Running on even ground 0 0  17. Running on uneven ground 0 0  18. Making sharp turns while running fast 0 0  19. Hopping  0 0  20. Rolling over in bed 4 4  Score total:  34/80 33/80     COGNITION: Overall cognitive status: Within functional limits for tasks assessed     SENSATION: Not tested    POSTURE: rounded shoulders and forward head  PALPATION: Standing: Increased resting tension in L paraspinals group. TTP L PSIS. Sitting: TTP PSIS L>R.   LUMBAR ROM:   AROM eval  Flexion 80  Extension   Right lateral flexion 10  Left lateral flexion 30  Right rotation   Left rotation    (Blank rows = not tested)  LOWER EXTREMITY ROM:   WFL BLE   Active  Right eval Left eval  Hip flexion    Hip extension    Hip abduction    Hip adduction    Hip internal rotation    Hip external rotation    Knee flexion    Knee extension    Ankle dorsiflexion    Ankle plantarflexion    Ankle inversion    Ankle eversion     (Blank rows = not tested)  LOWER EXTREMITY MMT:  MMT Right eval Left eval Right 09/09/24 Left 09/09/24  Hip flexion 4+/5 4-/5 4+/5 4+/5  Hip extension      Hip abduction 4-/5 3+/5 4/5 4-/5  Hip adduction 5/5 4/5 4/5 4-/5  Hip internal rotation      Hip external rotation      Knee flexion 4+/5 4/5 5/5 5/5  Knee extension 4/5 4-/5 5/5 5/5  Ankle dorsiflexion 5/5 5/5 5/5 5/5  Ankle plantarflexion 5/5 5/5 5/5 5/5  Ankle  inversion      Ankle eversion       (Blank rows = not tested)  LUMBAR SPECIAL TESTS:    FUNCTIONAL TESTS:  5 times sit to stand: 8.0s from elevated surface, 11s from standard chair height, no HHA. 09/09/24: 8s no HHA from standard chair height Timed up and go (TUG): 36s with NBQC 09/09/24: 13.15 average pace (3 trials)   GAIT: Distance walked: From Lobby to assessment room Assistive device utilized: [employer] - 2 wheeled Level of assistance: Modified independence with RW and SBA with quad cane Comments: With RW pt has wide BOS, uses reciprocal pattern with inc EV in stance phase. Uses dec cadence, but maintains upright posture without LOB during reaching or directional changes. Gait speed decreases significantly with use of NBQC in RUE demonstrating dec stride length greater on L vs right, slight forward flexed position following change in AD.    TREATMENT:  [facility] Adult PT Treatment:                                                DATE: 09/09/24 Therapeutic Exercise: Anterior step downs x10 BLE from 2 inch step with minimal HHA on table  Lateral step downs x10 LLE from 2 inch step Gait training: Pt completes TUG with NBQC, has improved cadence and directional changes, shows improved stability, steps are symmetric and pt able to complete task with good motor control. 3 attempts with T1-14.51s, T2-11.79s, T3-13.16s for an average time of 13.15s.  Pt completes gait training in parking lot using NBQC, pt has slight antalgic gait pattern with dec LLE WB tolerance translating to dec RLE stride length. Able to complete directional changes, attempted without AD and had increased instability, returns to using NBQC.   Therapeutic Activity: PT POC reviewed and discussed, goals assessed and updated, please see objective measures and goals for details.     [facility] Adult PT Treatment:                                                DATE: 09/05/24 Therapeutic Exercise: NuStep  level 5 x6 mins  Standing hip abd with green TB resistance, HHA PRN 3x8 alternating between reps Heel raises without support 3x10 Supine marching red TB 3x10 BLE alternating between reps Modified lunge 3x10 BLE Supine physioball press 2x15, 2 sec hold HEP revised and provided, reviewed  Gait training: Tandem walking x18ft with CGA and gait belt Gait training level surfaces without AD increasing obstacle negotiation including slight elevation changes, directional changes, and small spaces.     [facility] Adult PT Treatment:  DATE: 09/03/24 Therapeutic Exercise: Recumbent bike x8 mins level 3 resistance Mod hook lying trunk rotation x30 in physioball Supine bridges with straight leg on physioball for quad bias 3x10 Marching in slight abd 3x10 alt between reps, green TB Clams in sidelying with green TB 3x10 BLE alternating after lateral completion Gait training: Without AD level surface, good reciprocity, after 121ft pt has evidence of antalgic gait on LLE with weight shift evident in LLE stance and dec RLE stride, maintains balance, good directional changes Obstacle course hurdles, airex, foam step pad cues for avoiding circumduction over hurdles, able to improve with subsequent trials Manual Therapy: Manual soft tissue mobilization to psoas major with reduction in symptoms with decreased tolerance to deep pressure, maintained light to moderate pressure and strokes, placed in thomas position and requires manual assist to control level of elongation, x30s with symptoms abolishing                                                                                      PATIENT EDUCATION:  Education details: evaluation findings, POC, goals, HEP with proper form/ rationale.  Person educated: Patient and Child(ren) Education method: Explanation, Demonstration, Verbal cues, and Handouts Education comprehension: verbalized understanding  HOME EXERCISE  PROGRAM: Access Code: [email or web address] URL: [email or web address] Date: 09/05/2024 Prepared by: Stann Ohara  Exercises - Clam  - 4 x weekly - 3 sets - 8 reps - 2 hold - Sit to Stand  - 7 x weekly - 3 sets - 10 reps - Standing March with Counter Support  - 1 x daily - 7 x weekly - 1 sets - 10 reps - 5 hold - Active Straight Leg Raise with Quad Set  - 1 x daily - 7 x weekly - 2 sets - 15 reps - 2 hold - Standing 3-way Hip with Walker  - 1 x daily - 7 x weekly - 1 sets - 10 reps - 2 hold  ASSESSMENT:  CLINICAL IMPRESSION: 09/09/2024 Pt has been responding well to her current regimen. She has been noting dec pain and improving function and safety with home and community tasks. Discussed using NBQC for walks outside now and NBQC vs no AD in the home. Pt feels 80% improved from initial evaluation with remaining 20% being improved tolerance to prolonged activity, ability to return to entering and exiting bath tub and returning to independence with activities and participations. Strength has improved as well, pt progressing well through stages of healing. Pt stands to benefit from continued skilled physical therapy to address deficit areas and restore safety with activities and participations at home and in the community.    EVAL: Patient is a 89 y.o. F who was seen today for physical therapy evaluation and treatment for dx of pelvic fx with ORIF of sacrum on 04/12/24. She has functional trunk mobilty with the exception of R sidebending secondary to L lumabr paraspinal tension. LE strength is [facility] but has room for improvement, she demonstrates limited endurance/ stamina as well as confidence with balance without her RW. Practiced us  NBQC exhibiting and large step length on the L compared bil requiring frequent verbal and tactile cues on proper gait pattern during  assessment. She would benefit from physical therapy do promote strength and endurance, maximize stability and safety with gait with LRAD,  and maximize her function by addressing the deficits listed.   OBJECTIVE IMPAIRMENTS: Abnormal gait, decreased activity tolerance, decreased balance, decreased endurance, decreased knowledge of use of DME, decreased ROM, decreased strength, increased fascial restrictions, increased muscle spasms, and pain.   ACTIVITY LIMITATIONS: carrying, lifting, standing, stairs, bathing, and locomotion level  PARTICIPATION LIMITATIONS: meal prep, cleaning, laundry, shopping, and community activity  PERSONAL FACTORS: Age, Past/current experiences, and 1-2 comorbidities: Falls, and osteoporosis are also affecting patient's functional outcome.   REHAB POTENTIAL: Excellent  CLINICAL DECISION MAKING: Evolving/moderate complexity  EVALUATION COMPLEXITY: Moderate   GOALS: Goals reviewed with patient? Yes  SHORT TERM GOALS: Target date: 09/02/2024  Pt will be IND with initial HEP for therapeutic progression Baseline: no prior HEP 09/09/24: compliant, progressing based on function Goal status: PROGRESSING  2.  Pt to verbalize and demo efficient gait pattern with LRAD to maximize safety and balance while increasing mobility.  Baseline:  09/09/24: NBQC, antalgic with distances and uneven surfaces, has been using two wheeled walker for outdoor mobility Goal status: PROGRESSING  3.  Pt to be report pain to </= 2/10 with standing/ walking and sitting on hard surfaces to demo improving condition.  Baseline:  09/09/24: up to 4/10 over the past 2 weeks Goal status: PROGRESSING   LONG TERM GOALS: Target date: 09/30/2024  Pt to increase bil LE strength to to >/= 4+/5 to promote stability with walking/ standing to maximize safety. Baseline:  09/09/24: weakness present in add and abd mm group bilaterally, see MMT chart Goal status: PROGRESSING  2.  Improve LEFS to >/= 55/80 to demonstrate improvement in function.  Baseline: 34/80 09/09/24: 33/80 pt had slight improvements, notes inc difficulty with  stairs and with walking between rooms, pt had assistance completing from her friend today.  Goal status: PROGRESSING  3.  Reduce tug time to </= 16 sec with LRAD for safety and stability with walking/ standing  Baseline:  09/09/24: 13.15s with NBQC Goal status: PROGRESSING  4.  Pt to be able to navigate obstacles of varying heights and firm and unlevel surfaces with LRAD and report feeling stabile and no fear of falling.  Baseline:  09/09/24: inc time and assistance required when negotiation obstacles Goal status: IN PROGRESS   5.  PT to be IND with all HEP given and is able to maintain and progress their current LOF IND.  Baseline:  09/09/24: continue HEP as pt progresses Goal status: PROGRESSING    PLAN:  PT FREQUENCY: 1-2x/week  PT DURATION: 12 weeks  PLANNED INTERVENTIONS: 97110-Therapeutic exercises, 97530- Therapeutic activity, [id number]- Neuromuscular re-education, 97535- Self Care, [id number]- Manual therapy, [id number]- Gait training, [id number]- Electrical stimulation (unattended), 20560 (1-2 muscles), [id number] (3+ muscles)- Dry Needling, Patient/Family education, Cryotherapy, and Moist heat.  PLAN FOR NEXT SESSION: Review/ update HEP PRN. Gait training with SBQC, gross hip strengthening, static/ dynamic balance.     Stann DELENA Ohara PT 09/09/24 1:14 PM

## 2024-09-11 ENCOUNTER — Ambulatory Visit: Admitting: Physical Therapy

## 2024-09-11 ENCOUNTER — Encounter: Payer: Self-pay | Admitting: Physical Therapy

## 2024-09-11 DIAGNOSIS — R2689 Other abnormalities of gait and mobility: Secondary | ICD-10-CM | POA: Diagnosis not present

## 2024-09-11 DIAGNOSIS — M5459 Other low back pain: Secondary | ICD-10-CM

## 2024-09-11 DIAGNOSIS — M6281 Muscle weakness (generalized): Secondary | ICD-10-CM | POA: Diagnosis not present

## 2024-09-11 NOTE — Therapy (Signed)
 OUTPATIENT PHYSICAL THERAPY THORACOLUMBAR TREATMENT   Patient Name: Danielle Proctor MRN: [id number] DOB:[birth year], 89 y.o., female Today's Date: 09/11/2024  END OF SESSION:  PT End of Session - 09/11/24 1223     Visit Number 11    Number of Visits 17    Date for Recertification  09/30/24    Authorization Type [employer] M[employer]    Authorization Time Period 08/05/24 - 11/03/2024    Authorization - Number of Visits 10    PT Start Time 1220    PT Stop Time 1300    PT Time Calculation (min) 40 min    Activity Tolerance Patient tolerated treatment well    Behavior During Therapy [facility] for tasks assessed/performed;Impulsive                   Past Medical History:  Diagnosis Date   Atherosclerotic peripheral vascular disease    Diverticulosis    Hypercholesteremia    Hypertension    IBS (irritable bowel syndrome)    Osteoarthritis    Osteoporosis    Thyroid  disease    Past Surgical History:  Procedure Laterality Date   APPENDECTOMY     CHOLECYSTECTOMY     COLON SURGERY     ORIF PELVIC FRACTURE WITH PERCUTANEOUS SCREWS Bilateral 04/12/2024   Procedure: CLOSED REDUCTION, PELVIS, WITH PERCUTANEOUS FIXATION;  Surgeon: Kendal Franky SQUIBB, MD;  Location: [facility] OR;  Service: Orthopedics;  Laterality: Bilateral;   Patient Active Problem List   Diagnosis Date Noted   Fall 04/11/2024   Closed fracture of transverse process of lumbar vertebra (HCC) 04/11/2024   Sacral fracture (HCC) 04/10/2024   Memory loss 03/07/2024   Hypertension 05/23/2022    PCP: Clarice Nottingham, MD  REFERRING PROVIDER: Kendal Franky SQUIBB, MD   REFERRING DIAG: Pelvic fracture (HCC) [D67.9XXA]   Rationale for Evaluation and Treatment: Rehabilitation  THERAPY DIAG:  Other low back pain  Muscle weakness (generalized)  Other abnormalities of gait and mobility  ONSET DATE: DOS 04/12/24  SUBJECTIVE:                                                                                                                                                                                            SUBJECTIVE STATEMENT: 09/11/2024 Pt states that she has been doing well since her preivous session, reports compliance with HEP. Noted that she was able to walk 6 houses down the street and back.   Eval: Pt reports she fell backward and landed on her bottom and noted immediate pain and went to the ED she had ORIF of the sacrum on 04/12/24. She had home health PT for a  few visits. She reports 4/10 but the pain fluctuates but doesn't limit her. Since the surgery she has noted significant improvement with RW. Prior to this injury patient was independent with no AD.   PERTINENT HISTORY:  Osteoporosis see PMHx  PAIN:  Are you having pain? Yes: NPRS scale: 0/10 Pain location:   Pain description:   Aggravating factors: Relieving factors: meds  PRECAUTIONS: Fall  RED FLAGS: None   WEIGHT BEARING RESTRICTIONS: No  FALLS:  Has patient fallen in last 6 months? Yes. Number of falls 1  LIVING ENVIRONMENT: Lives with: lives with an adult companion Lives in: House/apartment Stairs: Yes: External: 3 steps; on right going up Has following equipment at home: Vannie - 2 wheeled and SBQD, shower chair.   OCCUPATION: retired  PLOF: Independent with basic ADLs  PATIENT GOALS: to be able to take a bath   OBJECTIVE:  Note: Objective measures were completed at Evaluation unless otherwise noted.  DIAGNOSTIC FINDINGS:  See chart  PATIENT SURVEYS:  LEFS  Extreme difficulty/unable (0), Quite a bit of difficulty (1), Moderate difficulty (2), Little difficulty (3), No difficulty (4) Survey date:  08/05/24 09/09/24  Any of your usual work, housework or school activities 2 2  2. Usual hobbies, recreational or sporting activities 1 2  3. Getting into/out of the bath 0 0  4. Walking between rooms 3 2  5. Putting on socks/shoes 4 4  6. Squatting  3 4  7. Lifting an object, like a bag of groceries from the floor 1 1  8.  Performing light activities around your home 3 3  9. Performing heavy activities around your home 0 0  10. Getting into/out of a car 3 3  11. Walking 2 blocks 4 4  12. Walking 1 mile 0 0  13. Going up/down 10 stairs (1 flight) 2 0  14. Standing for 1 hour 0 0  15.  sitting for 1 hour 4 4  16. Running on even ground 0 0  17. Running on uneven ground 0 0  18. Making sharp turns while running fast 0 0  19. Hopping  0 0  20. Rolling over in bed 4 4  Score total:  34/80 33/80     COGNITION: Overall cognitive status: Within functional limits for tasks assessed     SENSATION: Not tested    POSTURE: rounded shoulders and forward head  PALPATION: Standing: Increased resting tension in L paraspinals group. TTP L PSIS. Sitting: TTP PSIS L>R.   LUMBAR ROM:   AROM eval  Flexion 80  Extension   Right lateral flexion 10  Left lateral flexion 30  Right rotation   Left rotation    (Blank rows = not tested)  LOWER EXTREMITY ROM:   WFL BLE   Active  Right eval Left eval  Hip flexion    Hip extension    Hip abduction    Hip adduction    Hip internal rotation    Hip external rotation    Knee flexion    Knee extension    Ankle dorsiflexion    Ankle plantarflexion    Ankle inversion    Ankle eversion     (Blank rows = not tested)  LOWER EXTREMITY MMT:    MMT Right eval Left eval Right 09/09/24 Left 09/09/24  Hip flexion 4+/5 4-/5 4+/5 4+/5  Hip extension      Hip abduction 4-/5 3+/5 4/5 4-/5  Hip adduction 5/5 4/5 4/5 4-/5  Hip internal rotation  Hip external rotation      Knee flexion 4+/5 4/5 5/5 5/5  Knee extension 4/5 4-/5 5/5 5/5  Ankle dorsiflexion 5/5 5/5 5/5 5/5  Ankle plantarflexion 5/5 5/5 5/5 5/5  Ankle inversion      Ankle eversion       (Blank rows = not tested)  LUMBAR SPECIAL TESTS:    FUNCTIONAL TESTS:  5 times sit to stand: 8.0s from elevated surface, 11s from standard chair height, no HHA. 09/09/24: 8s no HHA from standard chair  height Timed up and go (TUG): 36s with NBQC 09/09/24: 13.15 average pace (3 trials)   GAIT: Distance walked: From Lobby to assessment room Assistive device utilized: [employer] - 2 wheeled Level of assistance: Modified independence with RW and SBA with quad cane Comments: With RW pt has wide BOS, uses reciprocal pattern with inc EV in stance phase. Uses dec cadence, but maintains upright posture without LOB during reaching or directional changes. Gait speed decreases significantly with use of NBQC in RUE demonstrating dec stride length greater on L vs right, slight forward flexed position following change in AD.    TREATMENT:  [facility] Adult PT Treatment:                                                DATE: 09/11/24 Therapeutic Exercise: Lunges with foam pad for knee support x8 reps bilaterally Lateral step downs from foam pad 3x10 BLE alternating after lateral completion Straight leg bridge 3x10 on physioball. 2 sec hold SLR in sidelying 3x8 BLE alternating after lateral completion  Therapeutic Activity: Practice stepping in and out of the bathtub. Bar bell simulated edge of tub and frame from platform used as grab bar at home. Pt is able to complete various strategies including anterior step into and out of the tub, as well as lateral technique with good confidence, no LOB, and minor UE use on grab bar. Pt to trial with dry tub at home with aide present. Will report back at next session. Step up and over 12 inch step with NBQC x12 with fading CGA to SBA with task, no LOB and good safety awareness.    [facility] Adult PT Treatment:                                                DATE: 09/09/24 Therapeutic Exercise: Anterior step downs x10 BLE from 2 inch step with minimal HHA on table  Lateral step downs x10 LLE from 2 inch step Gait training: Pt completes TUG with NBQC, has improved cadence and directional changes, shows improved stability, steps are symmetric and pt able  to complete task with good motor control. 3 attempts with T1-14.51s, T2-11.79s, T3-13.16s for an average time of 13.15s.  Pt completes gait training in parking lot using NBQC, pt has slight antalgic gait pattern with dec LLE WB tolerance translating to dec RLE stride length. Able to complete directional changes, attempted without AD and had increased instability, returns to using NBQC.   Therapeutic Activity: PT POC reviewed and discussed, goals assessed and updated, please see objective measures and goals for details.     [facility] Adult PT Treatment:  DATE: 09/05/24 Therapeutic Exercise: NuStep level 5 x6 mins  Standing hip abd with green TB resistance, HHA PRN 3x8 alternating between reps Heel raises without support 3x10 Supine marching red TB 3x10 BLE alternating between reps Modified lunge 3x10 BLE Supine physioball press 2x15, 2 sec hold HEP revised and provided, reviewed  Gait training: Tandem walking x123ft with CGA and gait belt Gait training level surfaces without AD increasing obstacle negotiation including slight elevation changes, directional changes, and small spaces.                                                                                       PATIENT EDUCATION:  Education details: evaluation findings, POC, goals, HEP with proper form/ rationale.  Person educated: Patient and Child(ren) Education method: Explanation, Demonstration, Verbal cues, and Handouts Education comprehension: verbalized understanding  HOME EXERCISE PROGRAM: Access Code: [email or web address] URL: [email or web address] Date: 09/05/2024 Prepared by: Stann Ohara  Exercises - Clam  - 4 x weekly - 3 sets - 8 reps - 2 hold - Sit to Stand  - 7 x weekly - 3 sets - 10 reps - Standing March with Counter Support  - 1 x daily - 7 x weekly - 1 sets - 10 reps - 5 hold - Active Straight Leg Raise with Quad Set  - 1 x daily - 7 x weekly - 2 sets - 15  reps - 2 hold - Standing 3-way Hip with Walker  - 1 x daily - 7 x weekly - 1 sets - 10 reps - 2 hold  ASSESSMENT:  CLINICAL IMPRESSION: 09/11/2024 Pt has been responding well to her current regimen. She has been noting dec pain and improving function and safety with home and community tasks. Discussed using NBQC for walks outside now and NBQC vs no AD in the home. Pt feels 80% improved from initial evaluation with remaining 20% being improved tolerance to prolonged activity, ability to return to entering and exiting bath tub and returning to independence with activities and participations. Strength has improved as well, pt progressing well through stages of healing. Pt stands to benefit from continued skilled physical therapy to address deficit areas and restore safety with activities and participations at home and in the community.    EVAL: Patient is a 89 y.o. F who was seen today for physical therapy evaluation and treatment for dx of pelvic fx with ORIF of sacrum on 04/12/24. She has functional trunk mobilty with the exception of R sidebending secondary to L lumabr paraspinal tension. LE strength is [facility] but has room for improvement, she demonstrates limited endurance/ stamina as well as confidence with balance without her RW. Practiced us  NBQC exhibiting and large step length on the L compared bil requiring frequent verbal and tactile cues on proper gait pattern during assessment. She would benefit from physical therapy do promote strength and endurance, maximize stability and safety with gait with LRAD, and maximize her function by addressing the deficits listed.   OBJECTIVE IMPAIRMENTS: Abnormal gait, decreased activity tolerance, decreased balance, decreased endurance, decreased knowledge of use of DME, decreased ROM, decreased strength, increased fascial restrictions, increased muscle spasms, and pain.   ACTIVITY LIMITATIONS: carrying,  lifting, standing, stairs, bathing, and locomotion  level  PARTICIPATION LIMITATIONS: meal prep, cleaning, laundry, shopping, and community activity  PERSONAL FACTORS: Age, Past/current experiences, and 1-2 comorbidities: Falls, and osteoporosis are also affecting patient's functional outcome.   REHAB POTENTIAL: Excellent  CLINICAL DECISION MAKING: Evolving/moderate complexity  EVALUATION COMPLEXITY: Moderate   GOALS: Goals reviewed with patient? Yes  SHORT TERM GOALS: Target date: 09/02/2024  Pt will be IND with initial HEP for therapeutic progression Baseline: no prior HEP 09/09/24: compliant, progressing based on function Goal status: PROGRESSING  2.  Pt to verbalize and demo efficient gait pattern with LRAD to maximize safety and balance while increasing mobility.  Baseline:  09/09/24: NBQC, antalgic with distances and uneven surfaces, has been using two wheeled walker for outdoor mobility Goal status: PROGRESSING  3.  Pt to be report pain to </= 2/10 with standing/ walking and sitting on hard surfaces to demo improving condition.  Baseline:  09/09/24: up to 4/10 over the past 2 weeks Goal status: PROGRESSING   LONG TERM GOALS: Target date: 09/30/2024  Pt to increase bil LE strength to to >/= 4+/5 to promote stability with walking/ standing to maximize safety. Baseline:  09/09/24: weakness present in add and abd mm group bilaterally, see MMT chart Goal status: PROGRESSING  2.  Improve LEFS to >/= 55/80 to demonstrate improvement in function.  Baseline: 34/80 09/09/24: 33/80 pt had slight improvements, notes inc difficulty with stairs and with walking between rooms, pt had assistance completing from her friend today.  Goal status: PROGRESSING  3.  Reduce tug time to </= 16 sec with LRAD for safety and stability with walking/ standing  Baseline:  09/09/24: 13.15s with NBQC Goal status: PROGRESSING  4.  Pt to be able to navigate obstacles of varying heights and firm and unlevel surfaces with LRAD and report feeling  stabile and no fear of falling.  Baseline:  09/09/24: inc time and assistance required when negotiation obstacles Goal status: IN PROGRESS   5.  PT to be IND with all HEP given and is able to maintain and progress their current LOF IND.  Baseline:  09/09/24: continue HEP as pt progresses Goal status: PROGRESSING    PLAN:  PT FREQUENCY: 1-2x/week  PT DURATION: 12 weeks  PLANNED INTERVENTIONS: 97110-Therapeutic exercises, 97530- Therapeutic activity, [id number]- Neuromuscular re-education, 97535- Self Care, [id number]- Manual therapy, [id number]- Gait training, [phone]- Electrical stimulation (unattended), 20560 (1-2 muscles), [id number] (3+ muscles)- Dry Needling, Patient/Family education, Cryotherapy, and Moist heat.  PLAN FOR NEXT SESSION: Review/ update HEP PRN. Gait training with SBQC, gross hip strengthening, static/ dynamic balance.     Stann DELENA Ohara PT 09/11/24 1:51 PM

## 2024-09-17 ENCOUNTER — Ambulatory Visit: Admitting: Physical Therapy

## 2024-09-19 ENCOUNTER — Ambulatory Visit: Admitting: Physical Therapy

## 2024-09-19 DIAGNOSIS — R2689 Other abnormalities of gait and mobility: Secondary | ICD-10-CM | POA: Diagnosis present

## 2024-09-19 DIAGNOSIS — M5459 Other low back pain: Secondary | ICD-10-CM | POA: Insufficient documentation

## 2024-09-19 DIAGNOSIS — M6281 Muscle weakness (generalized): Secondary | ICD-10-CM | POA: Insufficient documentation

## 2024-09-19 NOTE — Therapy (Signed)
 OUTPATIENT PHYSICAL THERAPY THORACOLUMBAR TREATMENT   Patient Name: Danielle Proctor MRN: [id number] DOB:1929/01/14, 89 y.o., female Today's Date: 09/20/2024  END OF SESSION:              Past Medical History:  Diagnosis Date   Atherosclerotic peripheral vascular disease    Diverticulosis    Hypercholesteremia    Hypertension    IBS (irritable bowel syndrome)    Osteoarthritis    Osteoporosis    Thyroid  disease    Past Surgical History:  Procedure Laterality Date   APPENDECTOMY     CHOLECYSTECTOMY     COLON SURGERY     ORIF PELVIC FRACTURE WITH PERCUTANEOUS SCREWS Bilateral 04/12/2024   Procedure: CLOSED REDUCTION, PELVIS, WITH PERCUTANEOUS FIXATION;  Surgeon: Kendal Franky SQUIBB, MD;  Location: [facility] OR;  Service: Orthopedics;  Laterality: Bilateral;   Patient Active Problem List   Diagnosis Date Noted   Fall 04/11/2024   Closed fracture of transverse process of lumbar vertebra (HCC) 04/11/2024   Sacral fracture (HCC) 04/10/2024   Memory loss 03/07/2024   Hypertension 05/23/2022    PCP: Clarice Nottingham, MD  REFERRING PROVIDER: Kendal Franky SQUIBB, MD   REFERRING DIAG: Pelvic fracture (HCC) [D67.9XXA]   Rationale for Evaluation and Treatment: Rehabilitation  THERAPY DIAG:  Other low back pain  Muscle weakness (generalized)  Other abnormalities of gait and mobility  ONSET DATE: DOS 04/12/24  SUBJECTIVE:                                                                                                                                                                                           SUBJECTIVE STATEMENT: 09/20/2024 Pt states that she has continued to walk both indoors and outdoors with NBQC, has not attempted dry run in the tub yet. Pt bent over to retrieve an object and noted inc soreness, settled within a minute or two.    Eval: Pt reports she fell backward and landed on her bottom and noted immediate pain and went to the ED she had ORIF of the sacrum  on 04/12/24. She had home health PT for a few visits. She reports 4/10 but the pain fluctuates but doesn't limit her. Since the surgery she has noted significant improvement with RW. Prior to this injury patient was independent with no AD.   PERTINENT HISTORY:  Osteoporosis see PMHx  PAIN:  Are you having pain? Yes: NPRS scale: 0/10 Pain location:   Pain description:   Aggravating factors: Relieving factors: meds  PRECAUTIONS: Fall  RED FLAGS: None   WEIGHT BEARING RESTRICTIONS: No  FALLS:  Has patient fallen in last 6 months? Yes. Number of  falls 1  LIVING ENVIRONMENT: Lives with: lives with an adult companion Lives in: House/apartment Stairs: Yes: External: 3 steps; on right going up Has following equipment at home: Vannie - 2 wheeled and SBQD, shower chair.   OCCUPATION: retired  PLOF: Independent with basic ADLs  PATIENT GOALS: to be able to take a bath   OBJECTIVE:  Note: Objective measures were completed at Evaluation unless otherwise noted.  DIAGNOSTIC FINDINGS:  See chart  PATIENT SURVEYS:  LEFS  Extreme difficulty/unable (0), Quite a bit of difficulty (1), Moderate difficulty (2), Little difficulty (3), No difficulty (4) Survey date:  08/05/24 09/09/24  Any of your usual work, housework or school activities 2 2  2. Usual hobbies, recreational or sporting activities 1 2  3. Getting into/out of the bath 0 0  4. Walking between rooms 3 2  5. Putting on socks/shoes 4 4  6. Squatting  3 4  7. Lifting an object, like a bag of groceries from the floor 1 1  8. Performing light activities around your home 3 3  9. Performing heavy activities around your home 0 0  10. Getting into/out of a car 3 3  11. Walking 2 blocks 4 4  12. Walking 1 mile 0 0  13. Going up/down 10 stairs (1 flight) 2 0  14. Standing for 1 hour 0 0  15.  sitting for 1 hour 4 4  16. Running on even ground 0 0  17. Running on uneven ground 0 0  18. Making sharp turns while running fast 0 0   19. Hopping  0 0  20. Rolling over in bed 4 4  Score total:  34/80 33/80     COGNITION: Overall cognitive status: Within functional limits for tasks assessed     SENSATION: Not tested    POSTURE: rounded shoulders and forward head  PALPATION: Standing: Increased resting tension in L paraspinals group. TTP L PSIS. Sitting: TTP PSIS L>R.   LUMBAR ROM:   AROM eval  Flexion 80  Extension   Right lateral flexion 10  Left lateral flexion 30  Right rotation   Left rotation    (Blank rows = not tested)  LOWER EXTREMITY ROM:   WFL BLE   Active  Right eval Left eval  Hip flexion    Hip extension    Hip abduction    Hip adduction    Hip internal rotation    Hip external rotation    Knee flexion    Knee extension    Ankle dorsiflexion    Ankle plantarflexion    Ankle inversion    Ankle eversion     (Blank rows = not tested)  LOWER EXTREMITY MMT:    MMT Right eval Left eval Right 09/09/24 Left 09/09/24  Hip flexion 4+/5 4-/5 4+/5 4+/5  Hip extension      Hip abduction 4-/5 3+/5 4/5 4-/5  Hip adduction 5/5 4/5 4/5 4-/5  Hip internal rotation      Hip external rotation      Knee flexion 4+/5 4/5 5/5 5/5  Knee extension 4/5 4-/5 5/5 5/5  Ankle dorsiflexion 5/5 5/5 5/5 5/5  Ankle plantarflexion 5/5 5/5 5/5 5/5  Ankle inversion      Ankle eversion       (Blank rows = not tested)  LUMBAR SPECIAL TESTS:    FUNCTIONAL TESTS:  5 times sit to stand: 8.0s from elevated surface, 11s from standard chair height, no HHA. 09/09/24: 8s no HHA from  standard chair height Timed up and go (TUG): 36s with NBQC 09/09/24: 13.15 average pace (3 trials)   GAIT: Distance walked: From Lobby to assessment room Assistive device utilized: [employer] - 2 wheeled Level of assistance: Modified independence with RW and SBA with quad cane Comments: With RW pt has wide BOS, uses reciprocal pattern with inc EV in stance phase. Uses dec cadence, but maintains  upright posture without LOB during reaching or directional changes. Gait speed decreases significantly with use of NBQC in RUE demonstrating dec stride length greater on L vs right, slight forward flexed position following change in AD.    TREATMENT:  [facility] Adult PT Treatment:                                                DATE: 09/19/24 Therapeutic Exercise: Recumbent bike x5 mins  3 way hip in standing with 2# cuff weight  Prone knee flexion with cuff weight- pt reports easy, used red TB resistance with cramping of hamstring bilaterally, used manual resistance with similar effect Seated hamstring curls x5 sec hold with physioball 2x10 Therapeutic Activity: Item retrieval from the floor, with pt demonstrating RLE lead to retrieve items of sticky note, pen and cap, and tape measure    [facility] Adult PT Treatment:                                                DATE: 09/11/24 Therapeutic Exercise: Lunges with foam pad for knee support x8 reps bilaterally Lateral step downs from foam pad 3x10 BLE alternating after lateral completion Straight leg bridge 3x10 on physioball. 2 sec hold SLR in sidelying 3x8 BLE alternating after lateral completion  Therapeutic Activity: Practice stepping in and out of the bathtub. Bar bell simulated edge of tub and frame from platform used as grab bar at home. Pt is able to complete various strategies including anterior step into and out of the tub, as well as lateral technique with good confidence, no LOB, and minor UE use on grab bar. Pt to trial with dry tub at home with aide present. Will report back at next session. Step up and over 12 inch step with NBQC x12 with fading CGA to SBA with task, no LOB and good safety awareness.    [facility] Adult PT Treatment:                                                DATE: 09/09/24 Therapeutic Exercise: Anterior step downs x10 BLE from 2 inch step with minimal HHA on table  Lateral step downs x10 LLE from 2 inch step Gait  training: Pt completes TUG with NBQC, has improved cadence and directional changes, shows improved stability, steps are symmetric and pt able to complete task with good motor control. 3 attempts with T1-14.51s, T2-11.79s, T3-13.16s for an average time of 13.15s.  Pt completes gait training in parking lot using NBQC, pt has slight antalgic gait pattern with dec LLE WB tolerance translating to dec RLE stride length. Able to complete directional changes, attempted without AD and had  increased instability, returns to using NBQC.   Therapeutic Activity: PT POC reviewed and discussed, goals assessed and updated, please see objective measures and goals for details.                                                                                        PATIENT EDUCATION:  Education details: evaluation findings, POC, goals, HEP with proper form/ rationale.  Person educated: Patient and Child(ren) Education method: Explanation, Demonstration, Verbal cues, and Handouts Education comprehension: verbalized understanding  HOME EXERCISE PROGRAM: Access Code: [email or web address] URL: [email or web address] Date: 09/05/2024 Prepared by: Stann Ohara  Exercises - Clam  - 4 x weekly - 3 sets - 8 reps - 2 hold - Sit to Stand  - 7 x weekly - 3 sets - 10 reps - Standing March with Counter Support  - 1 x daily - 7 x weekly - 1 sets - 10 reps - 5 hold - Active Straight Leg Raise with Quad Set  - 1 x daily - 7 x weekly - 2 sets - 15 reps - 2 hold - Standing 3-way Hip with Walker  - 1 x daily - 7 x weekly - 1 sets - 10 reps - 2 hold  ASSESSMENT:  CLINICAL IMPRESSION: 09/20/2024 Pt has been progressing well and addresses strength, stability, and balance today. Pt demonstrates item retrieval without cues demonstrating a LLE lead position which places inc strain on LLE when bending which places pt at risk for inc falls. Pt cued to complete with RLE lead and is able to complete a more confident and stable  retrieval. Pt encouraged to use grabber at home and use broom for small items which would require fine motor and inc time in lunge position. Pt in agreement. Her dtr is planning to return from her trip this weekend, pt nearing PLOF and will benefit from 3 more sessions with increased time between penultimate and last session.   EVAL: Patient is a 89 y.o. F who was seen today for physical therapy evaluation and treatment for dx of pelvic fx with ORIF of sacrum on 04/12/24. She has functional trunk mobilty with the exception of R sidebending secondary to L lumabr paraspinal tension. LE strength is [facility] but has room for improvement, she demonstrates limited endurance/ stamina as well as confidence with balance without her RW. Practiced us  NBQC exhibiting and large step length on the L compared bil requiring frequent verbal and tactile cues on proper gait pattern during assessment. She would benefit from physical therapy do promote strength and endurance, maximize stability and safety with gait with LRAD, and maximize her function by addressing the deficits listed.   OBJECTIVE IMPAIRMENTS: Abnormal gait, decreased activity tolerance, decreased balance, decreased endurance, decreased knowledge of use of DME, decreased ROM, decreased strength, increased fascial restrictions, increased muscle spasms, and pain.   ACTIVITY LIMITATIONS: carrying, lifting, standing, stairs, bathing, and locomotion level  PARTICIPATION LIMITATIONS: meal prep, cleaning, laundry, shopping, and community activity  PERSONAL FACTORS: Age, Past/current experiences, and 1-2 comorbidities: Falls, and osteoporosis are also affecting patient's functional outcome.   REHAB POTENTIAL: Excellent  CLINICAL DECISION MAKING: Evolving/moderate complexity  EVALUATION COMPLEXITY: Moderate  GOALS: Goals reviewed with patient? Yes  SHORT TERM GOALS: Target date: 09/02/2024  Pt will be IND with initial HEP for therapeutic progression Baseline:  no prior HEP 09/09/24: compliant, progressing based on function Goal status: PROGRESSING  2.  Pt to verbalize and demo efficient gait pattern with LRAD to maximize safety and balance while increasing mobility.  Baseline:  09/09/24: NBQC, antalgic with distances and uneven surfaces, has been using two wheeled walker for outdoor mobility Goal status: PROGRESSING  3.  Pt to be report pain to </= 2/10 with standing/ walking and sitting on hard surfaces to demo improving condition.  Baseline:  09/09/24: up to 4/10 over the past 2 weeks Goal status: PROGRESSING   LONG TERM GOALS: Target date: 09/30/2024  Pt to increase bil LE strength to to >/= 4+/5 to promote stability with walking/ standing to maximize safety. Baseline:  09/09/24: weakness present in add and abd mm group bilaterally, see MMT chart Goal status: PROGRESSING  2.  Improve LEFS to >/= 55/80 to demonstrate improvement in function.  Baseline: 34/80 09/09/24: 33/80 pt had slight improvements, notes inc difficulty with stairs and with walking between rooms, pt had assistance completing from her friend today.  Goal status: PROGRESSING  3.  Reduce tug time to </= 16 sec with LRAD for safety and stability with walking/ standing  Baseline:  09/09/24: 13.15s with NBQC Goal status: PROGRESSING  4.  Pt to be able to navigate obstacles of varying heights and firm and unlevel surfaces with LRAD and report feeling stabile and no fear of falling.  Baseline:  09/09/24: inc time and assistance required when negotiation obstacles Goal status: IN PROGRESS   5.  PT to be IND with all HEP given and is able to maintain and progress their current LOF IND.  Baseline:  09/09/24: continue HEP as pt progresses Goal status: PROGRESSING    PLAN:  PT FREQUENCY: 1-2x/week  PT DURATION: 12 weeks  PLANNED INTERVENTIONS: 97110-Therapeutic exercises, 97530- Therapeutic activity, [id number]- Neuromuscular re-education, 97535- Self Care, [id number]-  Manual therapy, [id number]- Gait training, [id number]- Electrical stimulation (unattended), 20560 (1-2 muscles), 20561 (3+ muscles)- Dry Needling, Patient/Family education, Cryotherapy, and Moist heat.  PLAN FOR NEXT SESSION: Review/ update HEP PRN. Gait training with SBQC, gross hip strengthening, static/ dynamic balance.     Stann DELENA Ohara PT 09/20/24 10:11 PM

## 2024-10-01 NOTE — Addendum Note (Signed)
 Addended by: CHAYA PERFECT A on: 10/01/2024 10:40 AM   Modules accepted: Orders

## 2024-10-02 ENCOUNTER — Ambulatory Visit

## 2024-10-02 DIAGNOSIS — M5459 Other low back pain: Secondary | ICD-10-CM

## 2024-10-02 DIAGNOSIS — M6281 Muscle weakness (generalized): Secondary | ICD-10-CM

## 2024-10-02 DIAGNOSIS — R2689 Other abnormalities of gait and mobility: Secondary | ICD-10-CM

## 2024-10-02 NOTE — Therapy (Signed)
 OUTPATIENT PHYSICAL THERAPY THORACOLUMBAR TREATMENT   Patient Name: Danielle Proctor MRN: [id number] DOB:08-Oct-1929, 89 y.o., female Today's Date: 10/02/2024  END OF SESSION:  PT End of Session - 10/02/24 1212     Visit Number 13    Number of Visits 17    Date for Recertification  11/01/23    Authorization Type [employer] M[employer]    Authorization Time Period 09/09/24-12/10/24    Authorization - Visit Number 4    Authorization - Number of Visits 6    Progress Note Due on Visit 20    PT Start Time 1215    PT Stop Time 1253    PT Time Calculation (min) 38 min    Activity Tolerance Patient tolerated treatment well    Behavior During Therapy [facility] for tasks assessed/performed;Impulsive          Past Medical History:  Diagnosis Date   Atherosclerotic peripheral vascular disease    Diverticulosis    Hypercholesteremia    Hypertension    IBS (irritable bowel syndrome)    Osteoarthritis    Osteoporosis    Thyroid  disease    Past Surgical History:  Procedure Laterality Date   APPENDECTOMY     CHOLECYSTECTOMY     COLON SURGERY     ORIF PELVIC FRACTURE WITH PERCUTANEOUS SCREWS Bilateral 04/12/2024   Procedure: CLOSED REDUCTION, PELVIS, WITH PERCUTANEOUS FIXATION;  Surgeon: Kendal Franky SQUIBB, MD;  Location: [facility] OR;  Service: Orthopedics;  Laterality: Bilateral;   Patient Active Problem List   Diagnosis Date Noted   Fall 04/11/2024   Closed fracture of transverse process of lumbar vertebra (HCC) 04/11/2024   Sacral fracture (HCC) 04/10/2024   Memory loss 03/07/2024   Hypertension 05/23/2022    PCP: Clarice Nottingham, MD  REFERRING PROVIDER: Kendal Franky SQUIBB, MD   REFERRING DIAG: Pelvic fracture (HCC) [D67.9XXA]   Rationale for Evaluation and Treatment: Rehabilitation  THERAPY DIAG:  Other low back pain  Muscle weakness (generalized)  Other abnormalities of gait and mobility  ONSET DATE: DOS 04/12/24  SUBJECTIVE:                                                                                                                                                                                            SUBJECTIVE STATEMENT: Patient reports that she is using her can indoors and walker when outdoors, but hasn't been going outside much lately.   Eval: Pt reports she fell backward and landed on her bottom and noted immediate pain and went to the ED she had ORIF of the sacrum on 04/12/24. She had home health PT for a few visits. She reports 4/10  but the pain fluctuates but doesn't limit her. Since the surgery she has noted significant improvement with RW. Prior to this injury patient was independent with no AD.   PERTINENT HISTORY:  Osteoporosis see PMHx  PAIN:  Are you having pain? Yes: NPRS scale: 0/10 Pain location:   Pain description:   Aggravating factors: Relieving factors: meds  PRECAUTIONS: Fall  RED FLAGS: None   WEIGHT BEARING RESTRICTIONS: No  FALLS:  Has patient fallen in last 6 months? Yes. Number of falls 1  LIVING ENVIRONMENT: Lives with: lives with an adult companion Lives in: House/apartment Stairs: Yes: External: 3 steps; on right going up Has following equipment at home: Vannie - 2 wheeled and SBQD, shower chair.   OCCUPATION: retired  PLOF: Independent with basic ADLs  PATIENT GOALS: to be able to take a bath   OBJECTIVE:  Note: Objective measures were completed at Evaluation unless otherwise noted.  DIAGNOSTIC FINDINGS:  See chart  PATIENT SURVEYS:  LEFS  Extreme difficulty/unable (0), Quite a bit of difficulty (1), Moderate difficulty (2), Little difficulty (3), No difficulty (4) Survey date:  08/05/24 09/09/24  Any of your usual work, housework or school activities 2 2  2. Usual hobbies, recreational or sporting activities 1 2  3. Getting into/out of the bath 0 0  4. Walking between rooms 3 2  5. Putting on socks/shoes 4 4  6. Squatting  3 4  7. Lifting an object, like a bag of groceries from the floor 1 1  8. Performing  light activities around your home 3 3  9. Performing heavy activities around your home 0 0  10. Getting into/out of a car 3 3  11. Walking 2 blocks 4 4  12. Walking 1 mile 0 0  13. Going up/down 10 stairs (1 flight) 2 0  14. Standing for 1 hour 0 0  15.  sitting for 1 hour 4 4  16. Running on even ground 0 0  17. Running on uneven ground 0 0  18. Making sharp turns while running fast 0 0  19. Hopping  0 0  20. Rolling over in bed 4 4  Score total:  34/80 33/80     COGNITION: Overall cognitive status: Within functional limits for tasks assessed     SENSATION: Not tested    POSTURE: rounded shoulders and forward head  PALPATION: Standing: Increased resting tension in L paraspinals group. TTP L PSIS. Sitting: TTP PSIS L>R.   LUMBAR ROM:   AROM eval  Flexion 80  Extension   Right lateral flexion 10  Left lateral flexion 30  Right rotation   Left rotation    (Blank rows = not tested)  LOWER EXTREMITY ROM:   WFL BLE   Active  Right eval Left eval  Hip flexion    Hip extension    Hip abduction    Hip adduction    Hip internal rotation    Hip external rotation    Knee flexion    Knee extension    Ankle dorsiflexion    Ankle plantarflexion    Ankle inversion    Ankle eversion     (Blank rows = not tested)  LOWER EXTREMITY MMT:    MMT Right eval Left eval Right 09/09/24 Left 09/09/24  Hip flexion 4+/5 4-/5 4+/5 4+/5  Hip extension      Hip abduction 4-/5 3+/5 4/5 4-/5  Hip adduction 5/5 4/5 4/5 4-/5  Hip internal rotation      Hip  external rotation      Knee flexion 4+/5 4/5 5/5 5/5  Knee extension 4/5 4-/5 5/5 5/5  Ankle dorsiflexion 5/5 5/5 5/5 5/5  Ankle plantarflexion 5/5 5/5 5/5 5/5  Ankle inversion      Ankle eversion       (Blank rows = not tested)  LUMBAR SPECIAL TESTS:    FUNCTIONAL TESTS:  5 times sit to stand: 8.0s from elevated surface, 11s from standard chair height, no HHA. 09/09/24: 8s no HHA from standard chair height Timed  up and go (TUG): 36s with NBQC 09/09/24: 13.15 average pace (3 trials)   GAIT: Distance walked: From Lobby to assessment room Assistive device utilized: [employer] - 2 wheeled Level of assistance: Modified independence with RW and SBA with quad cane Comments: With RW pt has wide BOS, uses reciprocal pattern with inc EV in stance phase. Uses dec cadence, but maintains upright posture without LOB during reaching or directional changes. Gait speed decreases significantly with use of NBQC in RUE demonstrating dec stride length greater on L vs right, slight forward flexed position following change in AD.    TREATMENT:  [facility] Adult PT Treatment:                                                DATE: 10/02/24 Therapeutic Exercise: Recumbent bike x6 mins  3 way hip in standing with 2# cuff weight x10 BIL Standing hamstring curl 2# 2x10 BIL Seated LAQ 2# 2x10 Seated marching 2# 3x30 SLR 2# 2x10 BIL Bridges 2x10 Seated hip adduction ball squeeze 2x10   [facility] Adult PT Treatment:                                                DATE: 09/19/24 Therapeutic Exercise: Recumbent bike x5 mins  3 way hip in standing with 2# cuff weight  Prone knee flexion with cuff weight- pt reports easy, used red TB resistance with cramping of hamstring bilaterally, used manual resistance with similar effect Seated hamstring curls x5 sec hold with physioball 2x10 Therapeutic Activity: Item retrieval from the floor, with pt demonstrating RLE lead to retrieve items of sticky note, pen and cap, and tape measure   [facility] Adult PT Treatment:                                                DATE: 09/11/24 Therapeutic Exercise: Lunges with foam pad for knee support x8 reps bilaterally Lateral step downs from foam pad 3x10 BLE alternating after lateral completion Straight leg bridge 3x10 on physioball. 2 sec hold SLR in sidelying 3x8 BLE alternating after lateral completion  Therapeutic Activity: Practice  stepping in and out of the bathtub. Bar bell simulated edge of tub and frame from platform used as grab bar at home. Pt is able to complete various strategies including anterior step into and out of the tub, as well as lateral technique with good confidence, no LOB, and minor UE use on grab bar. Pt to trial with dry tub at home with aide present. Will report back at next  session. Step up and over 12 inch step with NBQC x12 with fading CGA to SBA with task, no LOB and good safety awareness.                                                         PATIENT EDUCATION:  Education details: evaluation findings, POC, goals, HEP with proper form/ rationale.  Person educated: Patient and Child(ren) Education method: Explanation, Demonstration, Verbal cues, and Handouts Education comprehension: verbalized understanding  HOME EXERCISE PROGRAM: Access Code: [email or web address] URL: [email or web address] Date: 09/05/2024 Prepared by: Stann Ohara  Exercises - Clam  - 4 x weekly - 3 sets - 8 reps - 2 hold - Sit to Stand  - 7 x weekly - 3 sets - 10 reps - Standing March with Counter Support  - 1 x daily - 7 x weekly - 1 sets - 10 reps - 5 hold - Active Straight Leg Raise with Quad Set  - 1 x daily - 7 x weekly - 2 sets - 15 reps - 2 hold - Standing 3-way Hip with Walker  - 1 x daily - 7 x weekly - 1 sets - 10 reps - 2 hold  ASSESSMENT:  CLINICAL IMPRESSION: Patient presents to PT reporting some muscle aches and soreness, overall doing well. Session today continued to focus on LE strengthening with standing activities to improve standing activity tolerance and safety with ambulation. She is looking forward to her daughter coming to town so that she can try to get into her bathtub. Patient was able to tolerate all prescribed exercises with no adverse effects. Patient continues to benefit from skilled PT services and should be progressed as able to improve functional independence.   EVAL: Patient is a  89 y.o. F who was seen today for physical therapy evaluation and treatment for dx of pelvic fx with ORIF of sacrum on 04/12/24. She has functional trunk mobilty with the exception of R sidebending secondary to L lumabr paraspinal tension. LE strength is [facility] but has room for improvement, she demonstrates limited endurance/ stamina as well as confidence with balance without her RW. Practiced us  NBQC exhibiting and large step length on the L compared bil requiring frequent verbal and tactile cues on proper gait pattern during assessment. She would benefit from physical therapy do promote strength and endurance, maximize stability and safety with gait with LRAD, and maximize her function by addressing the deficits listed.   OBJECTIVE IMPAIRMENTS: Abnormal gait, decreased activity tolerance, decreased balance, decreased endurance, decreased knowledge of use of DME, decreased ROM, decreased strength, increased fascial restrictions, increased muscle spasms, and pain.   ACTIVITY LIMITATIONS: carrying, lifting, standing, stairs, bathing, and locomotion level  PARTICIPATION LIMITATIONS: meal prep, cleaning, laundry, shopping, and community activity  PERSONAL FACTORS: Age, Past/current experiences, and 1-2 comorbidities: Falls, and osteoporosis are also affecting patient's functional outcome.   REHAB POTENTIAL: Excellent  CLINICAL DECISION MAKING: Evolving/moderate complexity  EVALUATION COMPLEXITY: Moderate   GOALS: Goals reviewed with patient? Yes  SHORT TERM GOALS: Target date: 09/02/2024  Pt will be IND with initial HEP for therapeutic progression Baseline: no prior HEP 09/09/24: compliant, progressing based on function Goal status: PROGRESSING  2.  Pt to verbalize and demo efficient gait pattern with LRAD to maximize safety and balance while increasing mobility.  Baseline:  09/09/24: NBQC, antalgic with  distances and uneven surfaces, has been using two wheeled walker for outdoor mobility Goal  status: PROGRESSING  3.  Pt to be report pain to </= 2/10 with standing/ walking and sitting on hard surfaces to demo improving condition.  Baseline:  09/09/24: up to 4/10 over the past 2 weeks Goal status: PROGRESSING   LONG TERM GOALS: Target date: 09/30/2024  Pt to increase bil LE strength to to >/= 4+/5 to promote stability with walking/ standing to maximize safety. Baseline:  09/09/24: weakness present in add and abd mm group bilaterally, see MMT chart Goal status: PROGRESSING  2.  Improve LEFS to >/= 55/80 to demonstrate improvement in function.  Baseline: 34/80 09/09/24: 33/80 pt had slight improvements, notes inc difficulty with stairs and with walking between rooms, pt had assistance completing from her friend today.  Goal status: PROGRESSING  3.  Reduce tug time to </= 16 sec with LRAD for safety and stability with walking/ standing  Baseline:  09/09/24: 13.15s with NBQC Goal status: PROGRESSING  4.  Pt to be able to navigate obstacles of varying heights and firm and unlevel surfaces with LRAD and report feeling stabile and no fear of falling.  Baseline:  09/09/24: inc time and assistance required when negotiation obstacles Goal status: IN PROGRESS   5.  PT to be IND with all HEP given and is able to maintain and progress their current LOF IND.  Baseline:  09/09/24: continue HEP as pt progresses Goal status: PROGRESSING    PLAN:  PT FREQUENCY: 1-2x/week  PT DURATION: 12 weeks  PLANNED INTERVENTIONS: 97110-Therapeutic exercises, 97530- Therapeutic activity, [id number]- Neuromuscular re-education, 97535- Self Care, [id number]- Manual therapy, [id number]- Gait training, [phone]- Electrical stimulation (unattended), 20560 (1-2 muscles), [id number] (3+ muscles)- Dry Needling, Patient/Family education, Cryotherapy, and Moist heat.  PLAN FOR NEXT SESSION: Review/ update HEP PRN. Gait training with SBQC, gross hip strengthening, static/ dynamic balance.     Corean Pouch  PTA 10/02/2024 1:11 PM

## 2024-10-20 NOTE — Therapy (Unsigned)
 " OUTPATIENT PHYSICAL THERAPY THORACOLUMBAR TREATMENT/DISCHARGE  Visits: 14 Time Frame: 08/05/24-10/21/24 Comments: Pt has been able to return to PLOF, notes inc pain in the mornings, but improves as the day progresses   Patient Name: Danielle Proctor MRN: [id number] DOB:1929-07-22, 89 y.o., female Today's Date: 10/21/2024  END OF SESSION:  PT End of Session - 10/21/24 1130     Visit Number 14    Number of Visits 17    Date for Recertification  11/01/23    Authorization Type [employer] M[employer]    Authorization Time Period 09/09/24-12/10/24    Progress Note Due on Visit 20    PT Start Time 1130    PT Stop Time 1204    PT Time Calculation (min) 34 min    Activity Tolerance Patient tolerated treatment well    Behavior During Therapy [facility] for tasks assessed/performed;Impulsive           Past Medical History:  Diagnosis Date   Atherosclerotic peripheral vascular disease    Diverticulosis    Hypercholesteremia    Hypertension    IBS (irritable bowel syndrome)    Osteoarthritis    Osteoporosis    Thyroid  disease    Past Surgical History:  Procedure Laterality Date   APPENDECTOMY     CHOLECYSTECTOMY     COLON SURGERY     ORIF PELVIC FRACTURE WITH PERCUTANEOUS SCREWS Bilateral 04/12/2024   Procedure: CLOSED REDUCTION, PELVIS, WITH PERCUTANEOUS FIXATION;  Surgeon: Kendal Franky SQUIBB, MD;  Location: [facility] OR;  Service: Orthopedics;  Laterality: Bilateral;   Patient Active Problem List   Diagnosis Date Noted   Fall 04/11/2024   Closed fracture of transverse process of lumbar vertebra (HCC) 04/11/2024   Sacral fracture (HCC) 04/10/2024   Memory loss 03/07/2024   Hypertension 05/23/2022    PCP: Clarice Nottingham, MD  REFERRING PROVIDER: Kendal Franky SQUIBB, MD   REFERRING DIAG: Pelvic fracture (HCC) [D67.9XXA]   Rationale for Evaluation and Treatment: Rehabilitation  THERAPY DIAG:  Other low back pain  Muscle weakness (generalized)  Other abnormalities of gait and mobility  ONSET  DATE: DOS 04/12/24  SUBJECTIVE:                                                                                                                                                                                           SUBJECTIVE STATEMENT: Patient states that she has pain in her low back and posterior hips, most notable in the mornings, managed with medications and improves as the day progresses. She has been able to take baths, and continues to walk outside and in the hallways at home, outside with family, inside ind.  Eval: Pt reports she fell backward and landed on her bottom and noted immediate pain and went to the ED she had ORIF of the sacrum on 04/12/24. She had home health PT for a few visits. She reports 4/10 but the pain fluctuates but doesn't limit her. Since the surgery she has noted significant improvement with RW. Prior to this injury patient was independent with no AD.   PERTINENT HISTORY:  Osteoporosis see PMHx  PAIN:  Are you having pain? Yes: NPRS scale: 2/10 Pain location:  low back, posterior bilateral hips  Pain description:  ache Aggravating factors: mornings Relieving factors: meds, as the day progresses  PRECAUTIONS: Fall  RED FLAGS: None   WEIGHT BEARING RESTRICTIONS: No  FALLS:  Has patient fallen in last 6 months? Yes. Number of falls 1  LIVING ENVIRONMENT: Lives with: lives with an adult companion Lives in: House/apartment Stairs: Yes: External: 3 steps; on right going up Has following equipment at home: Vannie - 2 wheeled and SBQD, shower chair.   OCCUPATION: retired  PLOF: Independent with basic ADLs  PATIENT GOALS: to be able to take a bath   OBJECTIVE:  Note: Objective measures were completed at Evaluation unless otherwise noted.  DIAGNOSTIC FINDINGS:  See chart  PATIENT SURVEYS:  LEFS  Extreme difficulty/unable (0), Quite a bit of difficulty (1), Moderate difficulty (2), Little difficulty (3), No difficulty (4) Survey date:  08/05/24  09/09/24 10/21/24  Any of your usual work, housework or school activities 2 2 4   2. Usual hobbies, recreational or sporting activities 1 2 4   3. Getting into/out of the bath 0 0 4  4. Walking between rooms 3 2 4   5. Putting on socks/shoes 4 4 4   6. Squatting  3 4 3   7. Lifting an object, like a bag of groceries from the floor 1 1 2   8. Performing light activities around your home 3 3 4   9. Performing heavy activities around your home 0 0 3  10. Getting into/out of a car 3 3 4   11. Walking 2 blocks 4 4 4   12. Walking 1 mile 0 0 0  13. Going up/down 10 stairs (1 flight) 2 0 1  14. Standing for 1 hour 0 0 3  15.  sitting for 1 hour 4 4 4   16. Running on even ground 0 0 0  17. Running on uneven ground 0 0 0  18. Making sharp turns while running fast 0 0 0  19. Hopping  0 0 0  20. Rolling over in bed 4 4 4   Score total:  34/80 33/80 52/80      COGNITION: Overall cognitive status: Within functional limits for tasks assessed     SENSATION: Not tested    POSTURE: rounded shoulders and forward head  PALPATION: Standing: Increased resting tension in L paraspinals group. TTP L PSIS. Sitting: TTP PSIS L>R.   LUMBAR ROM:   AROM eval  Flexion 80  Extension   Right lateral flexion 10  Left lateral flexion 30  Right rotation   Left rotation    (Blank rows = not tested)  LOWER EXTREMITY ROM:   WFL BLE   Active  Right eval Left eval  Hip flexion    Hip extension    Hip abduction    Hip adduction    Hip internal rotation    Hip external rotation    Knee flexion    Knee extension    Ankle dorsiflexion    Ankle plantarflexion  Ankle inversion    Ankle eversion     (Blank rows = not tested)  LOWER EXTREMITY MMT:    MMT Right eval Left eval Right 09/09/24 Left 09/09/24 Right  10/21/24 Left  10/21/24  Hip flexion 4+/5 4-/5 4+/5 4+/5 5/5 5/5  Hip extension        Hip abduction 4-/5 3+/5 4/5 4-/5 5/5 5/5  Hip adduction 5/5 4/5 4/5 4-/5 5/5 5/5  Hip internal rotation         Hip external rotation        Knee flexion 4+/5 4/5 5/5 5/5 5/5 5/5  Knee extension 4/5 4-/5 5/5 5/5 5/5 5/5  Ankle dorsiflexion 5/5 5/5 5/5 5/5 5/5 5/5  Ankle plantarflexion 5/5 5/5 5/5 5/5 5/5 5/5  Ankle inversion        Ankle eversion         (Blank rows = not tested)  LUMBAR SPECIAL TESTS:    FUNCTIONAL TESTS:  5 times sit to stand: 8.0s from elevated surface, 11s from standard chair height, no HHA. 09/09/24: 8s no HHA from standard chair height 10/21/24: 10s, good control, no LOB Timed up and go (TUG): 36s with NBQC 09/09/24: 13.15 average pace (3 trials) 10/21/24: 12.17s    GAIT: Distance walked: From Lobby to assessment room Assistive device utilized: [employer] - 2 wheeled Level of assistance: Modified independence with RW and SBA with quad cane Comments: With RW pt has wide BOS, uses reciprocal pattern with inc EV in stance phase. Uses dec cadence, but maintains upright posture without LOB during reaching or directional changes. Gait speed decreases significantly with use of NBQC in RUE demonstrating dec stride length greater on L vs right, slight forward flexed position following change in AD.    TREATMENT:   10/21/24- PT POC reviewed and discussed, goals assessed and updated, reviewed safety at home and adjustments for symptom response as needed.   [facility] Adult PT Treatment:                                                DATE: 10/02/24 Therapeutic Exercise: Recumbent bike x6 mins  3 way hip in standing with 2# cuff weight x10 BIL Standing hamstring curl 2# 2x10 BIL Seated LAQ 2# 2x10 Seated marching 2# 3x30 SLR 2# 2x10 BIL Bridges 2x10 Seated hip adduction ball squeeze 2x10   [facility] Adult PT Treatment:                                                DATE: 09/19/24 Therapeutic Exercise: Recumbent bike x5 mins  3 way hip in standing with 2# cuff weight  Prone knee flexion with cuff weight- pt reports easy, used red TB resistance with cramping of  hamstring bilaterally, used manual resistance with similar effect Seated hamstring curls x5 sec hold with physioball 2x10 Therapeutic Activity: Item retrieval from the floor, with pt demonstrating RLE lead to retrieve items of sticky note, pen and cap, and tape measure   [facility] Adult PT Treatment:  DATE: 09/11/24 Therapeutic Exercise: Lunges with foam pad for knee support x8 reps bilaterally Lateral step downs from foam pad 3x10 BLE alternating after lateral completion Straight leg bridge 3x10 on physioball. 2 sec hold SLR in sidelying 3x8 BLE alternating after lateral completion  Therapeutic Activity: Practice stepping in and out of the bathtub. Bar bell simulated edge of tub and frame from platform used as grab bar at home. Pt is able to complete various strategies including anterior step into and out of the tub, as well as lateral technique with good confidence, no LOB, and minor UE use on grab bar. Pt to trial with dry tub at home with aide present. Will report back at next session. Step up and over 12 inch step with NBQC x12 with fading CGA to SBA with task, no LOB and good safety awareness.                                                         PATIENT EDUCATION:  Education details: evaluation findings, POC, goals, HEP with proper form/ rationale.  Person educated: Patient and Child(ren) Education method: Explanation, Demonstration, Verbal cues, and Handouts Education comprehension: verbalized understanding  HOME EXERCISE PROGRAM: Access Code: [email or web address] URL: [email or web address] Date: 10/21/2024 Prepared by: Stann Ohara  Exercises - Clam  - 3-4 x weekly - 3 sets - 8 reps - 2 hold - Sit to Stand  - 3-4 x weekly - 3 sets - 10 reps - Standing March with Counter Support  - 1 x daily - 3-4 x weekly - 1 sets - 10 reps - 5 hold - Active Straight Leg Raise with Quad Set  - 1 x daily - 3-4 x weekly - 2 sets - 15 reps - 2  hold - Standing 3-way Hip with Walker  - 1 x daily - 3-4 x weekly - 1 sets - 10 reps - 2 hold  ASSESSMENT:  CLINICAL IMPRESSION: Pt responded well to PT POC and is able to demonstrate improvements in her function and tolerance. Objective improvements noted and goals reflect restoration of function at home and in the community. Pt educated on HEP continuation with dec frequency per week of completion, reminded for assist when entering and exiting the tub to maintain safety and dec risk of falls. Pt has attended 14 sessions of skilled PT from 08/05/24-10/21/24 and is hereby dc from skilled PT to HEP at home. Thank you for the opportunity to assist in the care of Ms, Ramseur.    EVAL: Patient is a 89 y.o. F who was seen today for physical therapy evaluation and treatment for dx of pelvic fx with ORIF of sacrum on 04/12/24. She has functional trunk mobilty with the exception of R sidebending secondary to L lumabr paraspinal tension. LE strength is [facility] but has room for improvement, she demonstrates limited endurance/ stamina as well as confidence with balance without her RW. Practiced us  NBQC exhibiting and large step length on the L compared bil requiring frequent verbal and tactile cues on proper gait pattern during assessment. She would benefit from physical therapy do promote strength and endurance, maximize stability and safety with gait with LRAD, and maximize her function by addressing the deficits listed.   OBJECTIVE IMPAIRMENTS: Abnormal gait, decreased activity tolerance, decreased balance, decreased endurance, decreased knowledge of use of DME, decreased ROM,  decreased strength, increased fascial restrictions, increased muscle spasms, and pain.   ACTIVITY LIMITATIONS: carrying, lifting, standing, stairs, bathing, and locomotion level  PARTICIPATION LIMITATIONS: meal prep, cleaning, laundry, shopping, and community activity  PERSONAL FACTORS: Age, Past/current experiences, and 1-2 comorbidities:  Falls, and osteoporosis are also affecting patient's functional outcome.   REHAB POTENTIAL: Excellent  CLINICAL DECISION MAKING: Evolving/moderate complexity  EVALUATION COMPLEXITY: Moderate   GOALS: Goals reviewed with patient? Yes  SHORT TERM GOALS: Target date: 09/02/2024  Pt will be IND with initial HEP for therapeutic progression Baseline: no prior HEP 09/09/24: compliant, progressing based on function Goal status: MET  2.  Pt to verbalize and demo efficient gait pattern with LRAD to maximize safety and balance while increasing mobility.  Baseline:  09/09/24: NBQC, antalgic with distances and uneven surfaces, has been using two wheeled walker for outdoor mobility  10/21/24: Has been walking inside without the cane without assistance, has been walking outdoors with NBQC, has family/helper with her  Goal status: MET  3.  Pt to be report pain to </= 2/10 with standing/ walking and sitting on hard surfaces to demo improving condition.  Baseline:  09/09/24: up to 4/10 over the past 2 weeks 10/21/24: 2/10 in the mornings, improves as day progresses Goal status: MET   LONG TERM GOALS: Target date: 09/30/2024  Pt to increase bil LE strength to to >/= 4+/5 to promote stability with walking/ standing to maximize safety. Baseline:  09/09/24: weakness present in add and abd mm group bilaterally, see MMT chart 10/21/24: 5/5 BLE Goal status: MET   2.  Improve LEFS to >/= 55/80 to demonstrate improvement in function.  Baseline: 34/80 09/09/24: 33/80 pt had slight improvements, notes inc difficulty with stairs and with walking between rooms, pt had assistance completing from her friend today.  10/21/24: 52/80-safe with tasks, point deduction ocurrs in situations pt does not have to encounter (stairs, picking up heavy items from the ground) Goal status: NOT MET  3.  Reduce tug time to </= 16 sec with LRAD for safety and stability with walking/ standing  Baseline:  09/09/24: 13.15s with  NBQC 10/21/24: 12.17s NBQC Goal status: MET  4.  Pt to be able to navigate obstacles of varying heights and firm and unlevel surfaces with LRAD and report feeling stabile and no fear of falling.  Baseline:  09/09/24: inc time and assistance required when negotiation obstacles 10/21/24: able to routinely walk outside  Goal status: MET   5.  PT to be IND with all HEP given and is able to maintain and progress their current LOF IND.  Baseline:  09/09/24: continue HEP as pt progresses 10/21/24: ind with HEP, dec to 3-4 times per week  Goal status: MET    PLAN:  PT FREQUENCY: 1-2x/week  PT DURATION: 12 weeks  PLANNED INTERVENTIONS: 97110-Therapeutic exercises, 97530- Therapeutic activity, [id number]- Neuromuscular re-education, 97535- Self Care, [id number]- Manual therapy, [id number]- Gait training, [phone]- Electrical stimulation (unattended), 20560 (1-2 muscles), 20561 (3+ muscles)- Dry Needling, Patient/Family education, Cryotherapy, and Moist heat.  PLAN FOR NEXT SESSION: Review/ update HEP PRN. Gait training with SBQC, gross hip strengthening, static/ dynamic balance.     Stann DELENA Ohara PT 10/21/2024 12:41 PM  "

## 2024-10-21 ENCOUNTER — Encounter: Payer: Self-pay | Admitting: Physical Therapy

## 2024-10-21 ENCOUNTER — Ambulatory Visit: Attending: Student | Admitting: Physical Therapy

## 2024-10-21 DIAGNOSIS — R2689 Other abnormalities of gait and mobility: Secondary | ICD-10-CM | POA: Insufficient documentation

## 2024-10-21 DIAGNOSIS — M6281 Muscle weakness (generalized): Secondary | ICD-10-CM | POA: Diagnosis present

## 2024-10-21 DIAGNOSIS — M5459 Other low back pain: Secondary | ICD-10-CM | POA: Diagnosis present
